# Patient Record
Sex: Female | Born: 1975 | Race: White | Hispanic: Yes | Smoking: Current every day smoker
Health system: Southern US, Community
[De-identification: ages and names within clinical notes are randomized; demographics above are authoritative.]

## PROBLEM LIST (undated history)

## (undated) DIAGNOSIS — F419 Anxiety disorder, unspecified: Secondary | ICD-10-CM

## (undated) DIAGNOSIS — E119 Type 2 diabetes mellitus without complications: Secondary | ICD-10-CM

## (undated) DIAGNOSIS — H5213 Myopia, bilateral: Secondary | ICD-10-CM

## (undated) DIAGNOSIS — G629 Polyneuropathy, unspecified: Secondary | ICD-10-CM

## (undated) DIAGNOSIS — M5430 Sciatica, unspecified side: Secondary | ICD-10-CM

## (undated) DIAGNOSIS — C801 Malignant (primary) neoplasm, unspecified: Secondary | ICD-10-CM

## (undated) DIAGNOSIS — L680 Hirsutism: Secondary | ICD-10-CM

## (undated) DIAGNOSIS — K219 Gastro-esophageal reflux disease without esophagitis: Secondary | ICD-10-CM

## (undated) DIAGNOSIS — I10 Essential (primary) hypertension: Secondary | ICD-10-CM

## (undated) DIAGNOSIS — G4733 Obstructive sleep apnea (adult) (pediatric): Secondary | ICD-10-CM

## (undated) DIAGNOSIS — M797 Fibromyalgia: Secondary | ICD-10-CM

## (undated) DIAGNOSIS — K589 Irritable bowel syndrome without diarrhea: Secondary | ICD-10-CM

## (undated) DIAGNOSIS — Z794 Long term (current) use of insulin: Secondary | ICD-10-CM

## (undated) DIAGNOSIS — I639 Cerebral infarction, unspecified: Secondary | ICD-10-CM

## (undated) DIAGNOSIS — G43909 Migraine, unspecified, not intractable, without status migrainosus: Secondary | ICD-10-CM

## (undated) DIAGNOSIS — B029 Zoster without complications: Secondary | ICD-10-CM

## (undated) DIAGNOSIS — E785 Hyperlipidemia, unspecified: Secondary | ICD-10-CM

## (undated) DIAGNOSIS — F418 Other specified anxiety disorders: Secondary | ICD-10-CM

## (undated) HISTORY — DX: Fibromyalgia: M79.7

## (undated) HISTORY — DX: Hirsutism: L68.0

## (undated) HISTORY — DX: Long term (current) use of insulin: Z79.4

## (undated) HISTORY — PX: FOOT SURGERY: SHX648

## (undated) HISTORY — DX: Migraine, unspecified, not intractable, without status migrainosus: G43.909

## (undated) HISTORY — DX: Irritable bowel syndrome, unspecified: K58.9

## (undated) HISTORY — DX: Obstructive sleep apnea (adult) (pediatric): G47.33

## (undated) HISTORY — DX: Hyperlipidemia, unspecified: E78.5

## (undated) HISTORY — PX: FACIAL COSMETIC SURGERY: SHX629

## (undated) HISTORY — DX: Cerebral infarction, unspecified: I63.9

## (undated) HISTORY — DX: Polyneuropathy, unspecified: G62.9

## (undated) HISTORY — DX: Other specified anxiety disorders: F41.8

## (undated) HISTORY — DX: Myopia, bilateral: H52.13

## (undated) HISTORY — DX: Gastro-esophageal reflux disease without esophagitis: K21.9

---

## 1999-10-18 HISTORY — PX: CHOLECYSTECTOMY: SHX55

## 2010-05-28 ENCOUNTER — Emergency Department (HOSPITAL_BASED_OUTPATIENT_CLINIC_OR_DEPARTMENT_OTHER): Admission: EM | Admit: 2010-05-28 | Discharge: 2010-05-28 | Payer: Self-pay | Admitting: Emergency Medicine

## 2011-11-05 ENCOUNTER — Emergency Department (HOSPITAL_COMMUNITY)
Admission: EM | Admit: 2011-11-05 | Discharge: 2011-11-06 | Disposition: A | Discharge status: Home / Self Care | Payer: Self-pay | Attending: Emergency Medicine | Admitting: Emergency Medicine

## 2011-11-05 ENCOUNTER — Encounter: Payer: Self-pay | Admitting: *Deleted

## 2011-11-05 ENCOUNTER — Emergency Department (HOSPITAL_COMMUNITY): Payer: Self-pay

## 2011-11-05 DIAGNOSIS — M545 Low back pain, unspecified: Secondary | ICD-10-CM | POA: Insufficient documentation

## 2011-11-05 DIAGNOSIS — R51 Headache: Secondary | ICD-10-CM | POA: Insufficient documentation

## 2011-11-05 DIAGNOSIS — T1490XA Injury, unspecified, initial encounter: Secondary | ICD-10-CM | POA: Insufficient documentation

## 2011-11-05 DIAGNOSIS — M542 Cervicalgia: Secondary | ICD-10-CM | POA: Insufficient documentation

## 2011-11-05 HISTORY — DX: Essential (primary) hypertension: I10
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
## 2011-11-06 NOTE — ED Notes (Signed)
Patient is resting comfortably. 

## 2011-11-06 NOTE — ED Notes (Signed)
MD at bedside. 

## 2011-11-06 NOTE — ED Notes (Signed)
Family at bedside. 

## 2011-11-06 NOTE — Discharge Instructions (Signed)
It is very important that you have someone stay with you and monitor you.  Return to the Emergency Department immediately if you develop changes in your vision, vomiting, or confusion.  Take ibuprofen as needed for pain.  RESOURCE GUIDE  Dental Problems  Patients with Medicaid: [facility] [phone] [address]                                           [phone] [address] Phone:  [phone]                                                  Phone:  [phone]  If unable to pay or uninsured, contact:  [facility] or [facility] to become qualified for the adult dental clinic.  Chronic Pain Problems Contact [facility]  [phone] Patients need to be referred by their primary care doctor.  Insufficient Money for Medicine Contact [employer]:  call "211" or [facility] [phone].  No Primary Care Doctor Call [facility]  [phone] Other agencies that provide inexpensive medical care    [facility]  [phone]    [facility]  [phone]    [facility]  [phone]    [facility]  [phone]    [facility]  [phone]    [facility]  [phone]  Psychological Services [facility]  [phone] [facility]  [phone] [facility]   [phone] (emergency services [phone])  Substance Abuse Resources [facility]  [phone] [facility] [phone] [facility] [phone] Floydene Flock [phone] Residential & Outpatient Substance Abuse Program  ([phone]  Abuse/Neglect [facility] Child Abuse Hotline (512)063-5962 [facility] Child Abuse Hotline 548-373-7700 (After Hours)  Emergency Shelter [facility] [phone]  Maternity Homes [facility] [place] [facility] [phone] [facility] [phone]  MRSA Hotline  #:   954-736-5758    [facility] Resources  [facility] [place]     [employer]                          [facility] Dept. [address] [place]                       [address]      [address]  [place]                                                Cristobal Goldmann Phone:  [phone]  Phone:  [phone]                 Phone:  [phone]  [facility] Phone:  [phone]  [facility] Child Abuse Hotline [phone] [phone] (After Hours)    Assault, General Assault includes any behavior, whether intentional or reckless, which results in bodily injury to another person and/or damage to property. Included in this would be any behavior, intentional or reckless, that by its nature would be understood (interpreted) by a reasonable person as intent to harm another person or to damage his/her property. Threats may be oral or written. They may be communicated through regular mail, computer, fax, or phone. These threats may be direct or implied. FORMS OF ASSAULT INCLUDE:  Physically assaulting a person. This includes physical threats to inflict physical harm as well as:   Slapping.   Hitting.   Poking.   Kicking.   Punching.   Pushing.   Arson.   Sabotage.   Equipment vandalism.   Damaging or destroying property.   Throwing or hitting objects.   Displaying a weapon or an object that appears to be a weapon in a threatening manner.   Carrying a firearm of any kind.   Using a weapon to harm someone.   Using greater physical size/strength to intimidate another.   Making intimidating or threatening gestures.   Bullying.   Hazing.   Intimidating, threatening, hostile, or abusive language directed toward another person.   It communicates the intention to engage in violence against that person. And it leads a reasonable person to  expect that violent behavior may occur.   Stalking another person.  IF IT HAPPENS AGAIN:  Immediately call for emergency help (911 in [place]   If someone poses clear and immediate danger to you, seek legal authorities to have a protective or restraining order put in place.   Less threatening assaults can at least be reported to authorities.  STEPS TO TAKE IF A SEXUAL ASSAULT HAS HAPPENED  Go to an area of safety. This may include a shelter or staying with a friend. Stay away from the area where you have been attacked. A large percentage of sexual assaults are caused by a friend, relative or associate.   If medications were given by your caregiver, take them as directed for the full length of time prescribed.   Only take over-the-counter or prescription medicines for pain, discomfort, or fever as directed by your caregiver.   If you have come in contact with a sexual disease, find out if you are to be tested again. If your caregiver is concerned about the HIV/AIDS virus, he/she may require you to have continued testing for several months.   For the protection of your privacy, test results can not be given over the phone. Make sure you receive the results of your test. If your test results are not back during your visit, make an appointment with your caregiver to find out the results. Do not assume everything is normal if you have not heard from your caregiver or the medical facility. It is important for you to follow up on all of your test results.   File appropriate papers with authorities. This is important in all assaults, even if it has occurred in a family or by a friend.  SEEK MEDICAL CARE IF:  You have new problems because of your injuries.   You have problems that may be because of the medicine you are taking, such as:  Rash.   Itching.   Swelling.   Trouble breathing.   You develop belly (abdominal) pain, feel sick to your stomach (nausea) or are vomiting.   You begin  to run a temperature.   You need supportive care or referral to a rape crisis center. These are centers with trained personnel who can help you get through this ordeal.  SEEK IMMEDIATE MEDICAL CARE IF:  You are afraid of being threatened, beaten, or abused. In [place] call 911.   You receive new injuries related to abuse.   You develop severe pain in any area injured in the assault or have any change in your condition that concerns you.   You faint or lose consciousness.   You develop chest pain or shortness of breath.  Document Released: 12/03/2005 Document Revised: 08/15/2011 Document Reviewed: 07/21/2008 [facility] Patient Information 2012 [place], [place].

## 2018-02-22 ENCOUNTER — Encounter (HOSPITAL_COMMUNITY): Payer: Self-pay | Admitting: Emergency Medicine

## 2018-02-22 ENCOUNTER — Emergency Department (HOSPITAL_COMMUNITY)
Admission: EM | Admit: 2018-02-22 | Discharge: 2018-02-22 | Disposition: A | Discharge status: Home / Self Care | Payer: Medicare Other | Attending: Emergency Medicine | Admitting: Emergency Medicine

## 2018-02-22 DIAGNOSIS — R0789 Other chest pain: Secondary | ICD-10-CM | POA: Diagnosis not present

## 2018-02-22 DIAGNOSIS — R51 Headache: Secondary | ICD-10-CM | POA: Insufficient documentation

## 2018-02-22 DIAGNOSIS — R110 Nausea: Secondary | ICD-10-CM | POA: Insufficient documentation

## 2018-02-22 DIAGNOSIS — R0602 Shortness of breath: Secondary | ICD-10-CM | POA: Insufficient documentation

## 2018-02-22 DIAGNOSIS — F17210 Nicotine dependence, cigarettes, uncomplicated: Secondary | ICD-10-CM | POA: Diagnosis not present

## 2018-02-22 DIAGNOSIS — R1032 Left lower quadrant pain: Secondary | ICD-10-CM | POA: Diagnosis not present

## 2018-02-22 DIAGNOSIS — N390 Urinary tract infection, site not specified: Secondary | ICD-10-CM | POA: Diagnosis not present

## 2018-02-22 DIAGNOSIS — R350 Frequency of micturition: Secondary | ICD-10-CM | POA: Diagnosis not present

## 2018-02-22 DIAGNOSIS — R05 Cough: Secondary | ICD-10-CM | POA: Diagnosis not present

## 2018-02-22 DIAGNOSIS — R42 Dizziness and giddiness: Secondary | ICD-10-CM | POA: Diagnosis not present

## 2018-02-22 DIAGNOSIS — I10 Essential (primary) hypertension: Secondary | ICD-10-CM | POA: Diagnosis not present

## 2018-02-22 DIAGNOSIS — E119 Type 2 diabetes mellitus without complications: Secondary | ICD-10-CM | POA: Diagnosis not present

## 2018-02-22 DIAGNOSIS — R519 Headache, unspecified: Secondary | ICD-10-CM

## 2018-02-22 DIAGNOSIS — H53149 Visual discomfort, unspecified: Secondary | ICD-10-CM | POA: Insufficient documentation

## 2018-02-22 HISTORY — DX: Anxiety disorder, unspecified: F41.9

## 2018-02-22 HISTORY — DX: Type 2 diabetes mellitus without complications: E11.9

## 2018-02-22 LAB — I-STAT BETA HCG BLOOD, ED (MC, WL, AP ONLY): I-stat hCG, quantitative: <5 m[IU]/mL (ref <5)

## 2018-02-22 LAB — CBC
HCT: 48.1 % — ABNORMAL HIGH (ref 36.0–46.0)
Hemoglobin: 16.7 g/dL — ABNORMAL HIGH (ref 12.0–15.0)
MCH: 31.1 pg (ref 26.0–34.0)
MCHC: 34.7 g/dL (ref 30.0–36.0)
MCV: 89.6 fL (ref 78.0–100.0)
Platelets: 456 10*3/uL — ABNORMAL HIGH (ref 150–400)
RBC: 5.37 MIL/uL — ABNORMAL HIGH (ref 3.87–5.11)
RDW: 12.6 % (ref 11.5–15.5)
WBC: 12.8 10*3/uL — ABNORMAL HIGH (ref 4.0–10.5)

## 2018-02-22 LAB — BASIC METABOLIC PANEL
Anion gap: 9 (ref 5–15)
BUN: 11 mg/dL (ref 6–20)
CO2: 25 mmol/L (ref 22–32)
Calcium: 9.3 mg/dL (ref 8.9–10.3)
Chloride: 100 mmol/L — ABNORMAL LOW (ref 101–111)
Creatinine, Ser: 0.69 mg/dL (ref 0.44–1.00)
GFR calc Af Amer: >60 mL/min (ref >60)
GFR calc non Af Amer: >60 mL/min (ref >60)
Glucose, Bld: 359 mg/dL — ABNORMAL HIGH (ref 65–99)
Potassium: 4.6 mmol/L (ref 3.5–5.1)
Sodium: 134 mmol/L — ABNORMAL LOW (ref 135–145)

## 2018-02-22 LAB — I-STAT TROPONIN, ED: Troponin i, poc: 0 ng/mL (ref 0.00–0.08)

## 2018-02-22 LAB — CBG MONITORING, ED: Glucose-Capillary: 344 mg/dL — ABNORMAL HIGH (ref 65–99)

## 2018-02-22 MED ORDER — PROCHLORPERAZINE EDISYLATE 5 MG/ML IJ SOLN
10.0000 mg | Freq: Once | INTRAMUSCULAR | Status: AC
  Administered 2018-02-22: 10 mg via INTRAVENOUS
  Filled 2018-02-22: qty 2

## 2018-02-22 MED ORDER — CEPHALEXIN 500 MG PO CAPS: 500.0000 mg | ORAL_CAPSULE | Freq: Three times a day (TID) | ORAL | 0 refills | Status: DC

## 2018-02-22 MED ORDER — SODIUM CHLORIDE 0.9 % IV BOLUS (SEPSIS)
1000.0000 mL | Freq: Once | INTRAVENOUS | Status: AC
  Administered 2018-02-22: 1000 mL via INTRAVENOUS

## 2018-02-22 MED ORDER — DIPHENHYDRAMINE HCL 50 MG/ML IJ SOLN
25.0000 mg | Freq: Once | INTRAMUSCULAR | Status: AC
Start: 2018-02-22 — End: 2018-02-22
  Administered 2018-02-22: 25 mg via INTRAVENOUS
  Filled 2018-02-22: qty 1

## 2018-02-22 MED ORDER — ONDANSETRON 8 MG PO TBDP: 8.0000 mg | ORAL_TABLET | Freq: Three times a day (TID) | ORAL | 0 refills | Status: DC | PRN

## 2018-02-22 MED ORDER — KETOROLAC TROMETHAMINE 30 MG/ML IJ SOLN
30.0000 mg | Freq: Once | INTRAMUSCULAR | Status: AC
  Administered 2018-02-22: 30 mg via INTRAVENOUS
  Filled 2018-02-22: qty 1

## 2018-02-22 NOTE — ED Provider Notes (Signed)
[facility] DEPT Provider Note   CSN: [id number] Arrival date & time: 02/22/18  1803     History   Chief Complaint Chief Complaint  Patient presents with  . Dizziness  . Nausea    HPI Phyllis Pittman is a 42 y.o. female.  HPI Phyllis Pittman is a 42 y.o. female with hx of anxiety, DM, HTN, migraines, presents to ED with multiple complaints. Pt reports headache that is similar to her migraine for the last two days, reports subjective fever, chills, photophobia, chest pain, abdominal pain, left flank pain, nausea, dry heaves, dysuria, urinary frequency. States has tried her regular migraine medications which has not helped. States has not eaten much since last night due to no appetite.   Past Medical History:  Diagnosis Date  . Anxiety   . Diabetes mellitus without complication ([place])   . Hypertension     There are no active problems to display for this patient.   History reviewed. No pertinent surgical history.  OB History    No data available       Home Medications    Prior to Admission medications   Not on File    Family History No family history on file.  Social History Social History   Tobacco Use  . Smoking status: Current Every Day Smoker  . Smokeless tobacco: Never Used  Substance Use Topics  . Alcohol use: Yes  . Drug use: No     Allergies   Codeine and Famotidine   Review of Systems Review of Systems  Constitutional: Positive for chills and fever.  HENT: Negative for congestion.   Eyes: Positive for photophobia.  Respiratory: Positive for cough and shortness of breath. Negative for chest tightness.   Cardiovascular: Positive for chest pain. Negative for palpitations and leg swelling.  Gastrointestinal: Positive for abdominal pain, nausea and vomiting. Negative for diarrhea.  Genitourinary: Positive for dysuria and flank pain. Negative for pelvic pain, vaginal bleeding, vaginal discharge and vaginal pain.    Musculoskeletal: Negative for arthralgias, myalgias, neck pain and neck stiffness.  Skin: Negative for rash.  Neurological: Positive for headaches. Negative for dizziness and weakness.  All other systems reviewed and are negative.    Physical Exam Updated Vital Signs BP (!) 120/94 (BP Location: Left Arm)   Pulse 100   Temp 98.3 F (36.8 C) (Oral)   Resp 18   SpO2 96%   Physical Exam  Constitutional: She is oriented to person, place, and time. She appears well-developed and well-nourished. No distress.  HENT:  Head: Normocephalic.  Eyes: Conjunctivae and EOM are normal. Pupils are equal, round, and reactive to light.  Neck: Normal range of motion. Neck supple.  Cardiovascular: Normal rate, regular rhythm and normal heart sounds.  Pulmonary/Chest: Effort normal and breath sounds normal. No respiratory distress. She has no wheezes. She has no rales.  Abdominal: Soft. Bowel sounds are normal. She exhibits no distension. There is tenderness. There is no rebound.  Suprapubic  Tenderness. Left CVA tenderness  Musculoskeletal: She exhibits no edema.  Neurological: She is alert and oriented to person, place, and time.  5/5 and equal upper and lower extremity strength bilaterally. Equal grip strength bilaterally. Normal finger to nose and heel to shin. No pronator drift.   Skin: Skin is warm and dry.  Psychiatric: She has a normal mood and affect. Her behavior is normal.  Nursing note and vitals reviewed.    ED Treatments / Results  Labs (all labs ordered are listed, but  only abnormal results are displayed) Labs Reviewed  BASIC METABOLIC PANEL - Abnormal; Notable for the following components:      Result Value   Sodium 134 (*)    Chloride 100 (*)    Glucose, Bld 359 (*)    All other components within normal limits  CBC - Abnormal; Notable for the following components:   WBC 12.8 (*)    RBC 5.37 (*)    Hemoglobin 16.7 (*)    HCT 48.1 (*)    Platelets 456 (*)    All other  components within normal limits  CBG MONITORING, ED - Abnormal; Notable for the following components:   Glucose-Capillary 344 (*)    All other components within normal limits  URINALYSIS, ROUTINE W REFLEX MICROSCOPIC  I-STAT BETA HCG BLOOD, ED ([facility], [facility], [facility] ONLY)  I-STAT TROPONIN, ED    EKG  EKG Interpretation  Date/Time:  Saturday February 22 2018 18:26:31 EST Ventricular Rate:  99 PR Interval:    QRS Duration: 79 QT Interval:  335 QTC Calculation: 430 R Axis:   43 Text Interpretation:  Sinus rhythm Low voltage, precordial leads Borderline T wave abnormalities Baseline wander in lead(s) I aVL No old tracing to compare Confirmed by Daleen Bo [phone]) on 02/22/2018 6:38:11 PM       Radiology No results found.  Procedures Procedures (including critical care time)  Medications Ordered in ED Medications  ketorolac (TORADOL) 30 MG/ML injection 30 mg (not administered)  prochlorperazine (COMPAZINE) injection 10 mg (not administered)  diphenhydrAMINE (BENADRYL) injection 25 mg (not administered)  sodium chloride 0.9 % bolus 1,000 mL (not administered)     Initial Impression / Assessment and Plan / ED Course  I have reviewed the triage vital signs and the nursing notes.  Pertinent labs & imaging results that were available during my care of the patient were reviewed by me and considered in my medical decision making (see chart for details).     Patient in emergency department multiple complaints.  She is hyperglycemic, CBG is 344.  She is also complaining of chest pain, abdominal pain, nausea, migraine headache, dizziness.  I will check orthostatics, will check labs, will administer IV fluids.   Patient is not significantly orthostatic.  Her white blood cell count hemoglobin and platelets are all elevated.  Question dehydration.  She received fluids in department.  She received migraine cocktail, Toradol, Compazine, Benadryl, headache improved.  Urine analysis is contaminated,  but is positive for many bacteria, 6-30 WBCs, WBC clumps, mucus, budding yeast, sperm, crystals all present.  Since she is having some urinary symptoms and suprapubic pain and flank pain, will treat for possible UTI.  Will start on Keflex.  Urine cultures have been sent.  She denies any possible concern for STD and states she is currently having a yeast infection and on Diflucan.  Instructed to continue to take Diflucan.  Her vital signs are all within normal at this time.  I do not think she needs any further imaging or treatment in emergency department.  We discussed better glucose control.  Discussed having her follow-up with her family doctor in 2-3 days for recheck.  If her symptoms worsen she needs to come back to emergency department.  Patient agrees with the plan.  Vitals:   02/22/18 1821 02/22/18 2000 02/22/18 2007 02/22/18 2108  BP: (!) 120/94 (!) 124/97 (!) 131/91   Pulse: 100 (!) 103 92   Resp: 18  16   Temp: 98.3 F (36.8 C)  TempSrc: Oral     SpO2: 96% 99% 99%   Weight:    78.9 kg (174 lb)  Height:    5' (1.524 m)     Final Clinical Impressions(s) / ED Diagnoses   Final diagnoses:  Nonintractable headache, unspecified chronicity pattern, unspecified headache type  Urinary tract infection without hematuria, site unspecified    ED Discharge Orders        Ordered    cephALEXin (KEFLEX) 500 MG capsule  3 times daily     02/22/18 2221    ondansetron (ZOFRAN ODT) 8 MG disintegrating tablet  Every 8 hours PRN     02/22/18 2221       Jeannett Senior, PA-C 02/22/18 2224    Daleen Bo, MD 02/22/18 2308

## 2018-02-22 NOTE — Discharge Instructions (Signed)
Rest, drink plenty of fluids.  Take Zofran as prescribed as needed for nausea.  Take Keflex as prescribed until all gone for urinary tract infection.  Make sure you take a Diflucan, there was some yeast in your urine today.  Follow-up with your family doctor in 2-3 days for recheck.  Return if worsening symptoms.

## 2018-02-22 NOTE — ED Triage Notes (Signed)
Patient c/o dizziness started last night esp with movements with nausea didn't vomit only dry heave.

## 2018-02-23 LAB — URINALYSIS, ROUTINE W REFLEX MICROSCOPIC
Bilirubin Urine: NEGATIVE
Glucose, UA: 500 mg/dL — AB
Hgb urine dipstick: NEGATIVE
Ketones, ur: 5 mg/dL — AB
Nitrite: NEGATIVE
Protein, ur: NEGATIVE mg/dL
Specific Gravity, Urine: 1.015 (ref 1.005–1.030)
Sperm, UA: NONE SEEN
pH: 7 (ref 5.0–8.0)

## 2018-03-01 LAB — URINE CULTURE: Culture: >=100000 — AB

## 2018-03-02 ENCOUNTER — Telehealth: Payer: Self-pay | Admitting: *Deleted

## 2018-03-02 NOTE — Telephone Encounter (Signed)
Post ED Visit - Positive Culture Follow-up  Culture report reviewed by antimicrobial stewardship pharmacist:  []  Elenor Quinones, Pharm.D. []  Heide Guile, Pharm.D., BCPS AQ-ID []  Parks Neptune, Pharm.D., BCPS []  Alycia Rossetti, Pharm.D., BCPS []  [place], Pharm.D., BCPS, AAHIVP []  Legrand Como, Pharm.D., BCPS, AAHIVP []  Salome Arnt, PharmD, BCPS []  Jalene Mullet, PharmD []  Vincenza Hews, PharmD, BCPS Georga Bora, PharmD  Positive urine culture Treated with Cephalexin, organism sensitive to the same and no further patient follow-up is required at this time.  Harlon Flor [facility] 03/02/2018, 11:19 AM

## 2019-05-18 ENCOUNTER — Other Ambulatory Visit (HOSPITAL_BASED_OUTPATIENT_CLINIC_OR_DEPARTMENT_OTHER): Payer: Self-pay | Admitting: Internal Medicine

## 2019-05-18 DIAGNOSIS — Z1231 Encounter for screening mammogram for malignant neoplasm of breast: Secondary | ICD-10-CM

## 2019-07-22 ENCOUNTER — Emergency Department (HOSPITAL_BASED_OUTPATIENT_CLINIC_OR_DEPARTMENT_OTHER)
Admission: EM | Admit: 2019-07-22 | Discharge: 2019-07-22 | Disposition: A | Discharge status: Home / Self Care | Payer: Medicare Other | Attending: Emergency Medicine | Admitting: Emergency Medicine

## 2019-07-22 ENCOUNTER — Other Ambulatory Visit: Payer: Self-pay

## 2019-07-22 ENCOUNTER — Encounter (HOSPITAL_BASED_OUTPATIENT_CLINIC_OR_DEPARTMENT_OTHER): Payer: Self-pay

## 2019-07-22 ENCOUNTER — Emergency Department (HOSPITAL_BASED_OUTPATIENT_CLINIC_OR_DEPARTMENT_OTHER): Payer: Medicare Other

## 2019-07-22 DIAGNOSIS — R1031 Right lower quadrant pain: Secondary | ICD-10-CM | POA: Insufficient documentation

## 2019-07-22 DIAGNOSIS — R1032 Left lower quadrant pain: Secondary | ICD-10-CM

## 2019-07-22 DIAGNOSIS — Z3202 Encounter for pregnancy test, result negative: Secondary | ICD-10-CM | POA: Insufficient documentation

## 2019-07-22 DIAGNOSIS — Z859 Personal history of malignant neoplasm, unspecified: Secondary | ICD-10-CM | POA: Insufficient documentation

## 2019-07-22 DIAGNOSIS — F17210 Nicotine dependence, cigarettes, uncomplicated: Secondary | ICD-10-CM | POA: Insufficient documentation

## 2019-07-22 DIAGNOSIS — R109 Unspecified abdominal pain: Secondary | ICD-10-CM

## 2019-07-22 DIAGNOSIS — I10 Essential (primary) hypertension: Secondary | ICD-10-CM | POA: Diagnosis not present

## 2019-07-22 DIAGNOSIS — Z79899 Other long term (current) drug therapy: Secondary | ICD-10-CM | POA: Diagnosis not present

## 2019-07-22 DIAGNOSIS — E119 Type 2 diabetes mellitus without complications: Secondary | ICD-10-CM | POA: Insufficient documentation

## 2019-07-22 DIAGNOSIS — Z794 Long term (current) use of insulin: Secondary | ICD-10-CM | POA: Insufficient documentation

## 2019-07-22 HISTORY — DX: Malignant (primary) neoplasm, unspecified: C80.1

## 2019-07-22 HISTORY — DX: Sciatica, unspecified side: M54.30

## 2019-07-22 HISTORY — DX: Zoster without complications: B02.9

## 2019-07-22 LAB — CBC WITH DIFFERENTIAL/PLATELET
Abs Immature Granulocytes: 0.03 10*3/uL (ref 0.00–0.07)
Basophils Absolute: 0 10*3/uL (ref 0.0–0.1)
Basophils Relative: 0 %
Eosinophils Absolute: 0.1 10*3/uL (ref 0.0–0.5)
Eosinophils Relative: 1 %
HCT: 43.3 % (ref 36.0–46.0)
Hemoglobin: 14.4 g/dL (ref 12.0–15.0)
Immature Granulocytes: 0 %
Lymphocytes Relative: 34 %
Lymphs Abs: 3.5 10*3/uL (ref 0.7–4.0)
MCH: 29.9 pg (ref 26.0–34.0)
MCHC: 33.3 g/dL (ref 30.0–36.0)
MCV: 90 fL (ref 80.0–100.0)
Monocytes Absolute: 0.6 10*3/uL (ref 0.1–1.0)
Monocytes Relative: 6 %
Neutro Abs: 6 10*3/uL (ref 1.7–7.7)
Neutrophils Relative %: 59 %
Platelets: 391 10*3/uL (ref 150–400)
RBC: 4.81 MIL/uL (ref 3.87–5.11)
RDW: 11.7 % (ref 11.5–15.5)
WBC: 10.3 10*3/uL (ref 4.0–10.5)
nRBC: 0 % (ref 0.0–0.2)

## 2019-07-22 LAB — LIPASE, BLOOD: Lipase: 42 U/L (ref 11–51)

## 2019-07-22 LAB — COMPREHENSIVE METABOLIC PANEL
ALT: 19 U/L (ref 0–44)
AST: 18 U/L (ref 15–41)
Albumin: 3.5 g/dL (ref 3.5–5.0)
Alkaline Phosphatase: 63 U/L (ref 38–126)
Anion gap: 9 (ref 5–15)
BUN: 6 mg/dL (ref 6–20)
CO2: 26 mmol/L (ref 22–32)
Calcium: 8.9 mg/dL (ref 8.9–10.3)
Chloride: 100 mmol/L (ref 98–111)
Creatinine, Ser: 0.66 mg/dL (ref 0.44–1.00)
GFR calc Af Amer: >60 mL/min (ref >60)
GFR calc non Af Amer: >60 mL/min (ref >60)
Glucose, Bld: 372 mg/dL — ABNORMAL HIGH (ref 70–99)
Potassium: 3.8 mmol/L (ref 3.5–5.1)
Sodium: 135 mmol/L (ref 135–145)
Total Bilirubin: 0.6 mg/dL (ref 0.3–1.2)
Total Protein: 6.6 g/dL (ref 6.5–8.1)

## 2019-07-22 LAB — URINALYSIS, ROUTINE W REFLEX MICROSCOPIC
Bilirubin Urine: NEGATIVE
Glucose, UA: >=500 mg/dL — AB
Hgb urine dipstick: NEGATIVE
Ketones, ur: NEGATIVE mg/dL
Leukocytes,Ua: NEGATIVE
Nitrite: NEGATIVE
Protein, ur: NEGATIVE mg/dL
Specific Gravity, Urine: 1.02 (ref 1.005–1.030)
pH: 6 (ref 5.0–8.0)

## 2019-07-22 LAB — URINALYSIS, MICROSCOPIC (REFLEX)

## 2019-07-22 LAB — PREGNANCY, URINE: Preg Test, Ur: NEGATIVE

## 2019-07-22 MED ORDER — METHOCARBAMOL 500 MG PO TABS: 500.0000 mg | ORAL_TABLET | Freq: Two times a day (BID) | ORAL | 0 refills | Status: DC

## 2019-07-22 MED ORDER — ACETAMINOPHEN 325 MG PO TABS
ORAL_TABLET | ORAL | Status: AC
  Administered 2019-07-22: 650 mg via ORAL
  Filled 2019-07-22: qty 2

## 2019-07-22 MED ORDER — ACETAMINOPHEN 325 MG PO TABS
650.0000 mg | ORAL_TABLET | Freq: Once | ORAL | Status: AC
  Administered 2019-07-22: 19:00:00 650 mg via ORAL

## 2019-07-22 MED ORDER — KETOROLAC TROMETHAMINE 30 MG/ML IJ SOLN
30.0000 mg | Freq: Once | INTRAMUSCULAR | Status: DC
  Filled 2019-07-22: qty 1

## 2019-07-22 NOTE — ED Triage Notes (Signed)
Pt c/o right flank pain x 3 days-sent by PCP to ED-NAD-steady gait

## 2019-07-22 NOTE — ED Notes (Signed)
Patient transported to CT 

## 2019-07-22 NOTE — ED Notes (Signed)
Pt requesting paid meds. Per PA, she is offered tylenol as she refused Toradol earlier. Pt refused tylenol

## 2019-07-22 NOTE — ED Provider Notes (Signed)
[facility] EMERGENCY DEPARTMENT Provider Note   CSN: [id number] Arrival date & time: 07/22/19  1434    History   Chief Complaint Chief Complaint  Patient presents with  . Flank Pain    HPI Phyllis Pittman is a 43 y.o. female smokes 3 anxiety, cancer, diabetes, hypertension, sciatica, shingles who presents for evaluation of right lower quadrant abdominal pain that radiates around the right flank that is been ongoing for the last 3 days.  She states that she feels like the pain starts in the right lower quadrant and radiates back.  She states it is worse with movement and bending.  She has not noted any associated dysuria or hematuria.  She states that she is taken over-the-counter medications with minimal improvement in pain.  She states that sometimes she feels like she will get a sharp pain down her legs.  She has not had any trauma, injury, fall.  She denies any numbness/weakness of her extremities.  She was seen by her PCP earlier this morning who advised her to come to the emergency department for further evaluation.  Patient states she has not noted any fevers, vomiting, nausea, chest pain, difficulty breathing.     The history is provided by the patient.    Past Medical History:  Diagnosis Date  . Anxiety   . Cancer ([place])   . Diabetes mellitus without complication ([place])   . Hypertension   . Sciatica   . Shingles     There are no active problems to display for this patient.   History reviewed. No pertinent surgical history.   OB History   No obstetric history on file.      Home Medications    Prior to Admission medications   Medication Sig Start Date End Date Taking? Authorizing Provider  insulin aspart (NOVOLOG) 100 UNIT/ML injection Inject into the skin 3 (three) times daily before meals.   Yes [provider]  insulin detemir (LEVEMIR) 100 UNIT/ML injection Inject into the skin daily.   Yes [provider]  naproxen (NAPROSYN) 500  MG tablet Take 500 mg by mouth 2 (two) times daily with a meal.   Yes [provider]  tiZANidine (ZANAFLEX) 2 MG tablet Take by mouth every 6 (six) hours as needed for muscle spasms.   Yes [provider]  cephALEXin (KEFLEX) 500 MG capsule Take 1 capsule (500 mg total) by mouth 3 (three) times daily. 02/22/18   Kirichenko, Tatyana, PA-C  methocarbamol (ROBAXIN) 500 MG tablet Take 1 tablet (500 mg total) by mouth 2 (two) times daily. 07/22/19   Volanda Napoleon, PA-C  ondansetron (ZOFRAN ODT) 8 MG disintegrating tablet Take 1 tablet (8 mg total) by mouth every 8 (eight) hours as needed for nausea or vomiting. 02/22/18   Jeannett Senior, PA-C    Family History No family history on file.  Social History Social History   Tobacco Use  . Smoking status: Current Every Day Smoker    Types: Cigarettes  . Smokeless tobacco: Never Used  Substance Use Topics  . Alcohol use: Yes    Comment: occ  . Drug use: No     Allergies   Famotidine and Levaquin [levofloxacin]   Review of Systems Review of Systems  Constitutional: Negative for fever.  Respiratory: Negative for cough and shortness of breath.   Cardiovascular: Negative for chest pain.  Gastrointestinal: Positive for abdominal pain. Negative for nausea and vomiting.  Genitourinary: Positive for flank pain. Negative for dysuria and hematuria.  Musculoskeletal: Positive for back pain.  Neurological: Negative for headaches.  All other systems reviewed and are negative.    Physical Exam Updated Vital Signs BP 108/75   Pulse 82   Temp 99.3 F (37.4 C) (Oral)   Resp 16   Ht 5' (1.524 m)   Wt 71.7 kg   LMP 07/10/2019   SpO2 97%   BMI 30.86 kg/m   Physical Exam Vitals signs and nursing note reviewed.  Constitutional:      Appearance: Normal appearance. She is well-developed.  HENT:     Head: Normocephalic and atraumatic.  Eyes:     General: Lids are normal.     Conjunctiva/sclera: Conjunctivae normal.      Pupils: Pupils are equal, round, and reactive to light.  Neck:     Musculoskeletal: Full passive range of motion without pain.  Cardiovascular:     Rate and Rhythm: Normal rate and regular rhythm.     Pulses: Normal pulses.          Radial pulses are 2+ on the right side and 2+ on the left side.       Dorsalis pedis pulses are 2+ on the right side and 2+ on the left side.     Heart sounds: Normal heart sounds. No murmur. No friction rub. No gallop.   Pulmonary:     Effort: Pulmonary effort is normal.     Breath sounds: Normal breath sounds.  Abdominal:     Palpations: Abdomen is soft. Abdomen is not rigid.     Tenderness: There is generalized abdominal tenderness. There is right CVA tenderness. There is no guarding.     Comments: Abdomen soft, nondistended.  Diffuse abdominal tenderness noted slightly more worse in the right lower quadrant.  No focal point.  Right-sided CVA tenderness noted.  No rigidity, guarding.  Musculoskeletal: Normal range of motion.  Skin:    General: Skin is warm and dry.     Capillary Refill: Capillary refill takes less than 2 seconds.  Neurological:     Mental Status: She is alert and oriented to person, place, and time.     Comments: Follows commands, Moves all extremities  5/5 strength to BUE and BLE  Sensation intact throughout all major nerve distributions  Psychiatric:        Speech: Speech normal.      ED Treatments / Results  Labs (all labs ordered are listed, but only abnormal results are displayed) Labs Reviewed  URINALYSIS, ROUTINE W REFLEX MICROSCOPIC - Abnormal; Notable for the following components:      Result Value   APPearance CLOUDY (*)    Glucose, UA >=500 (*)    All other components within normal limits  URINALYSIS, MICROSCOPIC (REFLEX) - Abnormal; Notable for the following components:   Bacteria, UA MANY (*)    All other components within normal limits  COMPREHENSIVE METABOLIC PANEL - Abnormal; Notable for the following  components:   Glucose, Bld 372 (*)    All other components within normal limits  PREGNANCY, URINE  CBC WITH DIFFERENTIAL/PLATELET  LIPASE, BLOOD    EKG None  Radiology Ct Renal Stone Study  Result Date: 07/22/2019 CLINICAL DATA:  RIGHT flank pain for 3 days, suspected stone disease, diarrhea, history diabetes mellitus, hypertension, smoker EXAM: CT ABDOMEN AND PELVIS WITHOUT CONTRAST TECHNIQUE: Multidetector CT imaging of the abdomen and pelvis was performed following the standard protocol without IV contrast. Sagittal and coronal MPR images reconstructed from axial data set. No oral contrast administered.  COMPARISON:  05/27/2018 FINDINGS: Lower chest: Lung bases clear Hepatobiliary: Gallbladder surgically absent.  Liver unremarkable. Pancreas: Normal appearance Spleen: Normal appearance Adrenals/Urinary Tract: Adrenal glands normal appearance. Tiny BILATERAL nonobstructing renal calculi. No renal mass, hydronephrosis or hydroureter. Bladder unremarkable. Stomach/Bowel: Normal appendix. Stomach and bowel loops normal appearance. Vascular/Lymphatic: Aorta normal caliber. No adenopathy. Scattered pelvic phleboliths. Reproductive: Unremarkable uterus and adnexa. Small amount of air in vagina. Other: No free air or free fluid. No hernia or inflammatory process. Musculoskeletal: No acute osseous findings. IMPRESSION: No acute intra-abdominal or intrapelvic abnormalities. Tiny BILATERAL nonobstructing renal calculi. Electronically Signed   By: Lavonia Dana M.D.   On: 07/22/2019 17:10    Procedures Procedures (including critical care time)  Medications Ordered in ED Medications  ketorolac (TORADOL) 30 MG/ML injection 30 mg (30 mg Intravenous Refused 07/22/19 1623)  acetaminophen (TYLENOL) tablet 650 mg (650 mg Oral Given 07/22/19 1849)     Initial Impression / Assessment and Plan / ED Course  I have reviewed the triage vital signs and the nursing notes.  Pertinent labs & imaging results that were  available during my care of the patient were reviewed by me and considered in my medical decision making (see chart for details).        43 year old female who presents for evaluation of 3 days of right lower flank pain.  She states she saw her primary care who advised she come to the emergency department for further evaluation.  No preceding trauma, injury, fall.  She does report that she had a fall about a month and a half ago but has had some residual back pain since then.  Her flank pain and right side abdominal pain did not start until 3 days ago.  No fevers, urinary complaints, vomiting but has had some nausea.  Her pain is worse with movement.  Consider kidney stone versus UTI.  Also consider musculoskeletal etiology given it is worse with bending and movement.  History/physical exam not concerning for cauda equina, spinal abscess.  Doubt appendicitis.  History/physical exam concerning for ovarian etiology.  History/physical exam not concerning for aortic dissection.  Will plan to check labs.  Lipase is normal.  CMP shows glucose of 372.  Patient with known history of diabetes.  CBC without any significant leukocytosis or anemia.  UA shows evidence of infection.  Urine pregnancy negative.  CT reviewed.  No acute intra-abdominal process.  She does have biteral nonobstructing renal calculi.  This could be contributing to her pain but I doubt it.  I suspect her pain is most likely musculoskeletal in nature since it is worse with bending and movement.  Discussed results with patient.  This time, will plan to treat as musculoskeletal etiology.  Plan for short course of Robaxin. At this time, patient exhibits no emergent life-threatening condition that require further evaluation in ED or admission. Patient had ample opportunity for questions and discussion. All patient's questions were answered with full understanding. Strict return precautions discussed. Patient expresses understanding and agreement to  plan.   Portions of this note were generated with [employer]. Dictation errors may occur despite best attempts at proofreading.  Final Clinical Impressions(s) / ED Diagnoses   Final diagnoses:  Flank pain  Left lower quadrant abdominal pain    ED Discharge Orders         Ordered    methocarbamol (ROBAXIN) 500 MG tablet  2 times daily     07/22/19 1935  Volanda Napoleon, PA-C 07/22/19 2223    Blanchie Dessert, MD 07/22/19 2342

## 2019-07-22 NOTE — Discharge Instructions (Signed)
Take Robaxin as prescribed. This medication will make you drowsy so do not drive or drink alcohol when taking it.  Follow-up with your primary care doctor.  Return to the emergency department for any worsening pain, chest pain, fevers or any other worsening or concerning symptoms.

## 2019-08-06 ENCOUNTER — Ambulatory Visit (HOSPITAL_BASED_OUTPATIENT_CLINIC_OR_DEPARTMENT_OTHER): Payer: Medicare Other

## 2019-08-18 ENCOUNTER — Encounter (HOSPITAL_BASED_OUTPATIENT_CLINIC_OR_DEPARTMENT_OTHER): Payer: Self-pay

## 2019-08-18 ENCOUNTER — Ambulatory Visit (HOSPITAL_BASED_OUTPATIENT_CLINIC_OR_DEPARTMENT_OTHER)
Admission: RE | Admit: 2019-08-18 | Discharge: 2019-08-18 | Disposition: A | Discharge status: Home / Self Care | Payer: Medicare Other | Source: Ambulatory Visit | Attending: Internal Medicine | Admitting: Internal Medicine

## 2019-08-18 ENCOUNTER — Other Ambulatory Visit: Payer: Self-pay

## 2019-08-18 DIAGNOSIS — Z1231 Encounter for screening mammogram for malignant neoplasm of breast: Secondary | ICD-10-CM | POA: Insufficient documentation

## 2019-12-29 ENCOUNTER — Encounter (HOSPITAL_COMMUNITY): Payer: Self-pay

## 2019-12-29 ENCOUNTER — Emergency Department (HOSPITAL_COMMUNITY): Payer: Medicare Other

## 2019-12-29 ENCOUNTER — Emergency Department (HOSPITAL_COMMUNITY)
Admission: EM | Admit: 2019-12-29 | Discharge: 2019-12-29 | Disposition: A | Discharge status: Home / Self Care | Payer: Medicare Other | Attending: Emergency Medicine | Admitting: Emergency Medicine

## 2019-12-29 DIAGNOSIS — F17210 Nicotine dependence, cigarettes, uncomplicated: Secondary | ICD-10-CM | POA: Insufficient documentation

## 2019-12-29 DIAGNOSIS — Z794 Long term (current) use of insulin: Secondary | ICD-10-CM | POA: Diagnosis not present

## 2019-12-29 DIAGNOSIS — I10 Essential (primary) hypertension: Secondary | ICD-10-CM | POA: Insufficient documentation

## 2019-12-29 DIAGNOSIS — N938 Other specified abnormal uterine and vaginal bleeding: Secondary | ICD-10-CM | POA: Diagnosis not present

## 2019-12-29 DIAGNOSIS — E119 Type 2 diabetes mellitus without complications: Secondary | ICD-10-CM | POA: Insufficient documentation

## 2019-12-29 DIAGNOSIS — N939 Abnormal uterine and vaginal bleeding, unspecified: Secondary | ICD-10-CM

## 2019-12-29 DIAGNOSIS — R102 Pelvic and perineal pain: Secondary | ICD-10-CM | POA: Diagnosis present

## 2019-12-29 LAB — CBC WITH DIFFERENTIAL/PLATELET
Abs Immature Granulocytes: 0.03 10*3/uL (ref 0.00–0.07)
Basophils Absolute: 0.1 10*3/uL (ref 0.0–0.1)
Basophils Relative: 1 %
Eosinophils Absolute: 0.1 10*3/uL (ref 0.0–0.5)
Eosinophils Relative: 1 %
HCT: 47.4 % — ABNORMAL HIGH (ref 36.0–46.0)
Hemoglobin: 15.3 g/dL — ABNORMAL HIGH (ref 12.0–15.0)
Immature Granulocytes: 0 %
Lymphocytes Relative: 34 %
Lymphs Abs: 3.4 10*3/uL (ref 0.7–4.0)
MCH: 29.7 pg (ref 26.0–34.0)
MCHC: 32.3 g/dL (ref 30.0–36.0)
MCV: 92 fL (ref 80.0–100.0)
Monocytes Absolute: 0.7 10*3/uL (ref 0.1–1.0)
Monocytes Relative: 7 %
Neutro Abs: 5.7 10*3/uL (ref 1.7–7.7)
Neutrophils Relative %: 57 %
Platelets: 449 10*3/uL — ABNORMAL HIGH (ref 150–400)
RBC: 5.15 MIL/uL — ABNORMAL HIGH (ref 3.87–5.11)
RDW: 12 % (ref 11.5–15.5)
WBC: 10 10*3/uL (ref 4.0–10.5)
nRBC: 0 % (ref 0.0–0.2)

## 2019-12-29 LAB — WET PREP, GENITAL
Clue Cells Wet Prep HPF POC: NONE SEEN
Sperm: NONE SEEN
Trich, Wet Prep: NONE SEEN
WBC, Wet Prep HPF POC: NONE SEEN
Yeast Wet Prep HPF POC: NONE SEEN

## 2019-12-29 LAB — I-STAT BETA HCG BLOOD, ED (MC, WL, AP ONLY): I-stat hCG, quantitative: <5 m[IU]/mL (ref <5)

## 2019-12-29 LAB — BASIC METABOLIC PANEL
Anion gap: 11 (ref 5–15)
BUN: 14 mg/dL (ref 6–20)
CO2: 26 mmol/L (ref 22–32)
Calcium: 9.7 mg/dL (ref 8.9–10.3)
Chloride: 96 mmol/L — ABNORMAL LOW (ref 98–111)
Creatinine, Ser: 0.71 mg/dL (ref 0.44–1.00)
GFR calc Af Amer: >60 mL/min (ref >60)
GFR calc non Af Amer: >60 mL/min (ref >60)
Glucose, Bld: 472 mg/dL — ABNORMAL HIGH (ref 70–99)
Potassium: 4.3 mmol/L (ref 3.5–5.1)
Sodium: 133 mmol/L — ABNORMAL LOW (ref 135–145)

## 2019-12-29 NOTE — ED Notes (Signed)
Pt given crackers and gingerale.

## 2019-12-29 NOTE — Discharge Instructions (Signed)
Please read and follow all provided instructions.  Your diagnoses today include:  1. Abnormal uterine bleeding    Tests performed today include:  Blood counts and electrolytes -normal red and white blood cell count, sugar elevated in the 400s.  Wet prep -no vaginal infection  Ultrasound of the pelvis - Without any concerning findings  Vital signs. See below for your results today.   Medications prescribed:   None  Take any prescribed medications only as directed.  Home care instructions:  Follow any educational materials contained in this packet.  Follow-up instructions: Please follow-up with your primary care provider in the next 2-3 days for further evaluation of your symptoms.  It will probably be beneficial as well for you to follow-up with your OB/GYN.  Return instructions:   Please return to the Emergency Department if you experience worsening symptoms.   Return with uncontrolled or severe pain, heavier amounts of vaginal bleeding soaking through a tampon more than once every 1-2 hours.  Please return if you have any other emergent concerns.  Additional Information:  Your vital signs today were: BP 127/77 (BP Location: Left Arm)   Pulse 92   Temp 98.7 F (37.1 C) (Oral)   Resp 15   LMP 12/19/2019   SpO2 99%  If your blood pressure (BP) was elevated above 135/85 this visit, please have this repeated by your doctor within one month. --------------

## 2019-12-29 NOTE — ED Notes (Signed)
Pt ambulatory to the BR

## 2019-12-29 NOTE — ED Notes (Signed)
Lab notified of need to add on CBC and BMP.

## 2019-12-29 NOTE — ED Notes (Signed)
Pt ambulatory from triage 

## 2019-12-29 NOTE — ED Notes (Signed)
Pt verbalized discharge instructions and follow up care. Alert and ambulatory. No IV.  

## 2019-12-29 NOTE — ED Triage Notes (Addendum)
Patients reports vaginal bleeding since the 2nd of January that started off with spotting and has gradually worsened with small clots.   Patient reports in December her period started 9 days earlier and lasted 6 days.   C/O left lower abdominal cramping and fatigue since December   Patient reports seeing PCP for same yesterday and told to go to ED.   A/Ox4  Hx. Uterine Fibroid   BP-139/94  Patient reports blood work taken on the 5th.

## 2019-12-29 NOTE — ED Provider Notes (Signed)
[facility] DEPT Provider Note   CSN: [id number] Arrival date & time: 12/29/19  1444     History Chief Complaint  Patient presents with  . Vaginal Bleeding  . Abdominal Pain    Phyllis Pittman is a 44 y.o. female.  Patient with history of diabetes presents to the emergency department with complaint of lower left abdominal pain and pelvic pain with associated vaginal bleeding.  Patient states that vaginal bleeding started on January 2.  She had atypical bleeding in the mid December timeframe.  Bleeding is heavy at times, requiring replacement of tampon every few hours.  Leading has been more minimal today.  She has a cramping pain in the left lower quadrant and left pelvic area.  Patient has had nausea and fatigue as well as generalized weakness.  She has had some subjective shortness of breath.  No lightheadedness or syncope.  No chest pain.  No diarrhea.  No urinary symptoms.        Past Medical History:  Diagnosis Date  . Anxiety   . Cancer ([place])   . Diabetes mellitus without complication ([place])   . Hypertension   . Sciatica   . Shingles     There are no problems to display for this patient.   History reviewed. No pertinent surgical history.   OB History   No obstetric history on file.     History reviewed. No pertinent family history.  Social History   Tobacco Use  . Smoking status: Current Every Day Smoker    Types: Cigarettes  . Smokeless tobacco: Never Used  Substance Use Topics  . Alcohol use: Yes    Comment: occ  . Drug use: No    Home Medications Prior to Admission medications   Medication Sig Start Date End Date Taking? Authorizing Provider  cephALEXin (KEFLEX) 500 MG capsule Take 1 capsule (500 mg total) by mouth 3 (three) times daily. 02/22/18   Kirichenko, Tatyana, PA-C  insulin aspart (NOVOLOG) 100 UNIT/ML injection Inject into the skin 3 (three) times daily before meals.    [provider]  insulin  detemir (LEVEMIR) 100 UNIT/ML injection Inject into the skin daily.    [provider]  methocarbamol (ROBAXIN) 500 MG tablet Take 1 tablet (500 mg total) by mouth 2 (two) times daily. 07/22/19   Volanda Napoleon, PA-C  naproxen (NAPROSYN) 500 MG tablet Take 500 mg by mouth 2 (two) times daily with a meal.    [provider]  ondansetron (ZOFRAN ODT) 8 MG disintegrating tablet Take 1 tablet (8 mg total) by mouth every 8 (eight) hours as needed for nausea or vomiting. 02/22/18   Kirichenko, Tatyana, PA-C  tiZANidine (ZANAFLEX) 2 MG tablet Take by mouth every 6 (six) hours as needed for muscle spasms.    [provider]    Allergies    Famotidine, Levaquin [levofloxacin], and Trulicity [dulaglutide]  Review of Systems   Review of Systems  Constitutional: Positive for fatigue. Negative for fever.  HENT: Negative for rhinorrhea and sore throat.   Eyes: Negative for redness.  Respiratory: Positive for shortness of breath. Negative for cough.   Cardiovascular: Negative for chest pain.  Gastrointestinal: Positive for abdominal pain. Negative for diarrhea, nausea and vomiting.  Genitourinary: Positive for pelvic pain and vaginal bleeding. Negative for dysuria.  Musculoskeletal: Negative for myalgias.  Skin: Negative for rash.  Neurological: Positive for weakness. Negative for syncope, light-headedness and headaches.    Physical Exam Updated Vital Signs BP (!) 139/94  Pulse (!) 104   Temp 98.3 F (36.8 C) (Oral)   Resp 18   LMP 12/19/2019   SpO2 98%   Physical Exam Vitals and nursing note reviewed. Exam conducted with a chaperone present.  Constitutional:      Appearance: She is well-developed.  HENT:     Head: Normocephalic and atraumatic.  Eyes:     General:        Right eye: No discharge.        Left eye: No discharge.     Conjunctiva/sclera: Conjunctivae normal.  Cardiovascular:     Rate and Rhythm: Normal rate and regular rhythm.     Heart sounds:  Normal heart sounds.  Pulmonary:     Effort: Pulmonary effort is normal.     Breath sounds: Normal breath sounds.  Abdominal:     Palpations: Abdomen is soft.     Tenderness: There is abdominal tenderness in the periumbilical area, suprapubic area and left lower quadrant. There is no guarding or rebound.  Genitourinary:    Exam position: Lithotomy position.     Labia:        Right: No lesion.        Left: No lesion.      Vagina: Bleeding present.     Cervix: Cervical bleeding (mild) present.     Uterus: Normal. Not tender.      Adnexa: Right adnexa normal.       Right: No mass, tenderness or fullness.         Left: Tenderness present. No mass or fullness.       Comments: Patient with mild left-sided adnexal tenderness on exam most severe in the pelvis.  Patient is mildly tender with palpation on the lower abdominal wall. Musculoskeletal:     Cervical back: Normal range of motion and neck supple.  Skin:    General: Skin is warm and dry.  Neurological:     Mental Status: She is alert.     ED Results / Procedures / Treatments   Labs (all labs ordered are listed, but only abnormal results are displayed) Labs Reviewed  CBC WITH DIFFERENTIAL/PLATELET - Abnormal; Notable for the following components:      Result Value   RBC 5.15 (*)    Hemoglobin 15.3 (*)    HCT 47.4 (*)    Platelets 449 (*)    All other components within normal limits  BASIC METABOLIC PANEL - Abnormal; Notable for the following components:   Sodium 133 (*)    Chloride 96 (*)    Glucose, Bld 472 (*)    All other components within normal limits  WET PREP, GENITAL  I-STAT BETA HCG BLOOD, ED ([facility], [facility], [facility] ONLY)  GC/CHLAMYDIA PROBE AMP ([place]) NOT AT [facility]    EKG None  Radiology US Transvaginal Non-OB  Result Date: 12/29/2019 CLINICAL DATA:  Vaginal bleeding EXAM: TRANSABDOMINAL AND TRANSVAGINAL ULTRASOUND OF PELVIS TECHNIQUE: Both transabdominal and transvaginal ultrasound examinations of the pelvis  were performed. Transabdominal technique was performed for global imaging of the pelvis including uterus, ovaries, adnexal regions, and pelvic cul-de-sac. It was necessary to proceed with endovaginal exam following the transabdominal exam to visualize the uterus, endometrium, ovaries and adnexa. COMPARISON:  CT 11/10/2019 FINDINGS: Uterus Measurements: 10.6 x 5.0 x 5.6 cm = volume: 156 mL. Heterogeneous echotexture throughout the myometrium. Endometrium Thickness: 4 mm in thickness.  No focal abnormality visualized. Right ovary Measurements: 2.7 x 2.0 x 2.1 cm = volume: 5.8 mL. Normal appearance/no adnexal  mass. Left ovary Measurements: 2.2 x 1.5 x 2.1 cm = volume: 3.5 mL. Normal appearance/no adnexal mass. Other findings No abnormal free fluid. IMPRESSION: Heterogeneous echotexture throughout the uterus can be seen with adenomyosis. No focal fibroid. Normal endometrium. Electronically Signed   By: Rolm Baptise M.D.   On: 12/29/2019 20:06   US Pelvis Complete  Result Date: 12/29/2019 CLINICAL DATA:  Vaginal bleeding EXAM: TRANSABDOMINAL AND TRANSVAGINAL ULTRASOUND OF PELVIS TECHNIQUE: Both transabdominal and transvaginal ultrasound examinations of the pelvis were performed. Transabdominal technique was performed for global imaging of the pelvis including uterus, ovaries, adnexal regions, and pelvic cul-de-sac. It was necessary to proceed with endovaginal exam following the transabdominal exam to visualize the uterus, endometrium, ovaries and adnexa. COMPARISON:  CT 11/10/2019 FINDINGS: Uterus Measurements: 10.6 x 5.0 x 5.6 cm = volume: 156 mL. Heterogeneous echotexture throughout the myometrium. Endometrium Thickness: 4 mm in thickness.  No focal abnormality visualized. Right ovary Measurements: 2.7 x 2.0 x 2.1 cm = volume: 5.8 mL. Normal appearance/no adnexal mass. Left ovary Measurements: 2.2 x 1.5 x 2.1 cm = volume: 3.5 mL. Normal appearance/no adnexal mass. Other findings No abnormal free fluid. IMPRESSION:  Heterogeneous echotexture throughout the uterus can be seen with adenomyosis. No focal fibroid. Normal endometrium. Electronically Signed   By: Rolm Baptise M.D.   On: 12/29/2019 20:06    Procedures Procedures (including critical care time)  Medications Ordered in ED Medications - No data to display  ED Course  I have reviewed the triage vital signs and the nursing notes.  Pertinent labs & imaging results that were available during my care of the patient were reviewed by me and considered in my medical decision making (see chart for details).  Patient seen and examined. Work-up initiated. Normal WBC and slightly high hgb (pt smokes). Will need pelvic US and pelvic exam.   Vital signs reviewed and are as follows: BP (!) 139/94   Pulse (!) 104   Temp 98.3 F (36.8 C) (Oral)   Resp 18   LMP 12/19/2019   SpO2 98%   8:57 PM ultrasound performed.  Patient updated on results.  We discussed lab work results.  Pelvic exam performed with NT chaperone, Sam.  Patient has a mild amount of bleeding in the vaginal vault.  No clots noted.  Bleeding is minimal at this time and appears to be coming from the cervix.  Uterus is retroverted.  9:18 PM wet prep is negative for infection.  Patient is stable.  She will need PCP/OB/GYN follow-up.  We discussed signs and symptoms for her to return.    MDM Rules/Calculators/A&P                      Patient with left pelvic and lower quadrant abdominal pain in setting of abnormal vaginal bleeding.  Her red blood cell count is slightly high likely due to her smoking.  She does not require blood transfusion and is not symptomatic from an anemia standpoint.  Normal white blood cell count.  I have low concern for a left lower quadrant abdominal process such as diverticulitis or colitis.  She has not had significant diarrhea.  Suspect pelvic etiology of her pain.  She was evaluated with pelvic exam as well as pelvic ultrasound which did not show any concerning  findings.  She has PCP and OB/GYN follow-up.  We will discharged home at this point with return structures as above.  Final Clinical Impression(s) / ED Diagnoses Final diagnoses:  Abnormal  uterine bleeding    Rx / DC Orders ED Discharge Orders    None       Carlisle Cater, Hershal Coria 12/29/19 2119    Deno Etienne, DO 12/29/19 2122

## 2019-12-31 LAB — GC/CHLAMYDIA PROBE AMP (~~LOC~~) NOT AT ARMC
Chlamydia: NEGATIVE
Neisseria Gonorrhea: NEGATIVE

## 2020-01-15 ENCOUNTER — Inpatient Hospital Stay: Payer: Medicare Other

## 2020-01-15 ENCOUNTER — Inpatient Hospital Stay: Payer: Medicare Other | Attending: Hematology & Oncology | Admitting: Hematology & Oncology

## 2020-02-15 ENCOUNTER — Emergency Department (HOSPITAL_COMMUNITY): Payer: Medicare Other

## 2020-02-15 ENCOUNTER — Emergency Department (HOSPITAL_COMMUNITY)
Admission: EM | Admit: 2020-02-15 | Discharge: 2020-02-16 | Disposition: A | Discharge status: Home / Self Care | Payer: Medicare Other | Attending: Emergency Medicine | Admitting: Emergency Medicine

## 2020-02-15 ENCOUNTER — Emergency Department (HOSPITAL_BASED_OUTPATIENT_CLINIC_OR_DEPARTMENT_OTHER)
Admit: 2020-02-15 | Discharge: 2020-02-15 | Disposition: A | Discharge status: Home / Self Care | Payer: Medicare Other | Attending: Emergency Medicine | Admitting: Emergency Medicine

## 2020-02-15 ENCOUNTER — Other Ambulatory Visit: Payer: Self-pay

## 2020-02-15 ENCOUNTER — Encounter (HOSPITAL_COMMUNITY): Payer: Self-pay

## 2020-02-15 DIAGNOSIS — M79662 Pain in left lower leg: Secondary | ICD-10-CM | POA: Diagnosis not present

## 2020-02-15 DIAGNOSIS — Z79899 Other long term (current) drug therapy: Secondary | ICD-10-CM | POA: Diagnosis not present

## 2020-02-15 DIAGNOSIS — M79609 Pain in unspecified limb: Secondary | ICD-10-CM | POA: Diagnosis not present

## 2020-02-15 DIAGNOSIS — Z91040 Latex allergy status: Secondary | ICD-10-CM | POA: Insufficient documentation

## 2020-02-15 DIAGNOSIS — R12 Heartburn: Secondary | ICD-10-CM | POA: Diagnosis not present

## 2020-02-15 DIAGNOSIS — E119 Type 2 diabetes mellitus without complications: Secondary | ICD-10-CM | POA: Insufficient documentation

## 2020-02-15 DIAGNOSIS — Z794 Long term (current) use of insulin: Secondary | ICD-10-CM | POA: Diagnosis not present

## 2020-02-15 DIAGNOSIS — R7989 Other specified abnormal findings of blood chemistry: Secondary | ICD-10-CM | POA: Diagnosis not present

## 2020-02-15 DIAGNOSIS — R519 Headache, unspecified: Secondary | ICD-10-CM | POA: Insufficient documentation

## 2020-02-15 DIAGNOSIS — I10 Essential (primary) hypertension: Secondary | ICD-10-CM | POA: Insufficient documentation

## 2020-02-15 DIAGNOSIS — R072 Precordial pain: Secondary | ICD-10-CM | POA: Diagnosis not present

## 2020-02-15 DIAGNOSIS — R0602 Shortness of breath: Secondary | ICD-10-CM | POA: Insufficient documentation

## 2020-02-15 DIAGNOSIS — Z859 Personal history of malignant neoplasm, unspecified: Secondary | ICD-10-CM | POA: Insufficient documentation

## 2020-02-15 DIAGNOSIS — F419 Anxiety disorder, unspecified: Secondary | ICD-10-CM | POA: Insufficient documentation

## 2020-02-15 DIAGNOSIS — R0789 Other chest pain: Secondary | ICD-10-CM | POA: Diagnosis present

## 2020-02-15 DIAGNOSIS — R110 Nausea: Secondary | ICD-10-CM | POA: Insufficient documentation

## 2020-02-15 DIAGNOSIS — F17210 Nicotine dependence, cigarettes, uncomplicated: Secondary | ICD-10-CM | POA: Diagnosis not present

## 2020-02-15 LAB — LIPASE, BLOOD: Lipase: 46 U/L (ref 11–51)

## 2020-02-15 LAB — CBC WITH DIFFERENTIAL/PLATELET
Abs Immature Granulocytes: 0.05 10*3/uL (ref 0.00–0.07)
Basophils Absolute: 0.1 10*3/uL (ref 0.0–0.1)
Basophils Relative: 0 %
Eosinophils Absolute: 0.1 10*3/uL (ref 0.0–0.5)
Eosinophils Relative: 1 %
HCT: 42.2 % (ref 36.0–46.0)
Hemoglobin: 14.1 g/dL (ref 12.0–15.0)
Immature Granulocytes: 0 %
Lymphocytes Relative: 21 %
Lymphs Abs: 2.9 10*3/uL (ref 0.7–4.0)
MCH: 30.6 pg (ref 26.0–34.0)
MCHC: 33.4 g/dL (ref 30.0–36.0)
MCV: 91.5 fL (ref 80.0–100.0)
Monocytes Absolute: 0.9 10*3/uL (ref 0.1–1.0)
Monocytes Relative: 7 %
Neutro Abs: 9.9 10*3/uL — ABNORMAL HIGH (ref 1.7–7.7)
Neutrophils Relative %: 71 %
Platelets: 408 10*3/uL — ABNORMAL HIGH (ref 150–400)
RBC: 4.61 MIL/uL (ref 3.87–5.11)
RDW: 12.3 % (ref 11.5–15.5)
WBC: 13.9 10*3/uL — ABNORMAL HIGH (ref 4.0–10.5)
nRBC: 0 % (ref 0.0–0.2)

## 2020-02-15 LAB — I-STAT BETA HCG BLOOD, ED (MC, WL, AP ONLY): I-stat hCG, quantitative: <5 m[IU]/mL (ref <5)

## 2020-02-15 LAB — COMPREHENSIVE METABOLIC PANEL
ALT: 78 U/L — ABNORMAL HIGH (ref 0–44)
AST: 233 U/L — ABNORMAL HIGH (ref 15–41)
Albumin: 3.4 g/dL — ABNORMAL LOW (ref 3.5–5.0)
Alkaline Phosphatase: 71 U/L (ref 38–126)
Anion gap: 10 (ref 5–15)
BUN: 13 mg/dL (ref 6–20)
CO2: 27 mmol/L (ref 22–32)
Calcium: 8.5 mg/dL — ABNORMAL LOW (ref 8.9–10.3)
Chloride: 97 mmol/L — ABNORMAL LOW (ref 98–111)
Creatinine, Ser: 0.53 mg/dL (ref 0.44–1.00)
GFR calc Af Amer: >60 mL/min (ref >60)
GFR calc non Af Amer: >60 mL/min (ref >60)
Glucose, Bld: 355 mg/dL — ABNORMAL HIGH (ref 70–99)
Potassium: 4.9 mmol/L (ref 3.5–5.1)
Sodium: 134 mmol/L — ABNORMAL LOW (ref 135–145)
Total Bilirubin: 1.3 mg/dL — ABNORMAL HIGH (ref 0.3–1.2)
Total Protein: 6.2 g/dL — ABNORMAL LOW (ref 6.5–8.1)

## 2020-02-15 LAB — TROPONIN I (HIGH SENSITIVITY)
Troponin I (High Sensitivity): <2 ng/L (ref <18)
Troponin I (High Sensitivity): 3 ng/L (ref <18)

## 2020-02-15 LAB — D-DIMER, QUANTITATIVE
D-Dimer, Quant: 0.3 ug/mL-FEU (ref 0.00–0.50)
D-Dimer, Quant: <0.27 ug/mL-FEU (ref 0.00–0.50)

## 2020-02-15 MED ORDER — LIDOCAINE VISCOUS HCL 2 % MT SOLN
15.0000 mL | Freq: Once | OROMUCOSAL | Status: AC
  Administered 2020-02-15: 15 mL via ORAL
  Filled 2020-02-15: qty 15

## 2020-02-15 MED ORDER — SODIUM CHLORIDE 0.9 % IV BOLUS
1000.0000 mL | Freq: Once | INTRAVENOUS | Status: AC
  Administered 2020-02-15: 1000 mL via INTRAVENOUS

## 2020-02-15 MED ORDER — MORPHINE SULFATE (PF) 4 MG/ML IV SOLN
4.0000 mg | Freq: Once | INTRAVENOUS | Status: AC
  Administered 2020-02-15: 4 mg via INTRAVENOUS
  Filled 2020-02-15: qty 1

## 2020-02-15 MED ORDER — ALUM & MAG HYDROXIDE-SIMETH 200-200-20 MG/5ML PO SUSP
30.0000 mL | Freq: Once | ORAL | Status: AC
  Administered 2020-02-15: 30 mL via ORAL
  Filled 2020-02-15: qty 30

## 2020-02-15 NOTE — ED Provider Notes (Signed)
[facility] Provider Note   CSN: [id number] Arrival date & time: 02/15/20  1642     History Chief Complaint  Patient presents with  . Chest Pain    Phyllis Pittman is a 44 y.o. female.  The history is provided by the patient and medical records. No language interpreter was used.  Chest Pain Pain location:  Substernal area Pain quality: aching, crushing, pressure and tightness   Pain radiates to:  R shoulder Pain severity:  Severe Onset quality:  Gradual Timing:  Constant Progression:  Waxing and waning Chronicity:  Recurrent Context: at rest   Relieved by:  Nitroglycerin Worsened by:  Deep breathing, exertion and certain positions Ineffective treatments:  None tried Associated symptoms: anxiety, headache, heartburn, nausea and shortness of breath   Associated symptoms: no abdominal pain, no back pain, no claudication, no cough, no diaphoresis, no dizziness, no dysphagia, no fatigue, no fever, no lower extremity edema, no near-syncope, no numbness, no palpitations, no syncope, no vomiting and no weakness        Past Medical History:  Diagnosis Date  . Anxiety   . Cancer ([place])   . Diabetes mellitus without complication ([place])   . Hypertension   . Sciatica   . Shingles     There are no problems to display for this patient.   History reviewed. No pertinent surgical history.   OB History   No obstetric history on file.     History reviewed. No pertinent family history.  Social History   Tobacco Use  . Smoking status: Current Every Day Smoker    Types: Cigarettes  . Smokeless tobacco: Never Used  Substance Use Topics  . Alcohol use: Yes    Comment: occ  . Drug use: No    Home Medications Prior to Admission medications   Medication Sig Start Date End Date Taking? Authorizing Provider  cephALEXin (KEFLEX) 500 MG capsule Take 1 capsule (500 mg total) by mouth 3 (three) times daily. 02/22/18   Kirichenko, Tatyana, PA-C    insulin aspart (NOVOLOG) 100 UNIT/ML injection Inject into the skin 3 (three) times daily before meals.    [provider]  insulin detemir (LEVEMIR) 100 UNIT/ML injection Inject into the skin daily.    [provider]  methocarbamol (ROBAXIN) 500 MG tablet Take 1 tablet (500 mg total) by mouth 2 (two) times daily. 07/22/19   Volanda Napoleon, PA-C  naproxen (NAPROSYN) 500 MG tablet Take 500 mg by mouth 2 (two) times daily with a meal.    [provider]  ondansetron (ZOFRAN ODT) 8 MG disintegrating tablet Take 1 tablet (8 mg total) by mouth every 8 (eight) hours as needed for nausea or vomiting. 02/22/18   Kirichenko, Tatyana, PA-C  tiZANidine (ZANAFLEX) 2 MG tablet Take by mouth every 6 (six) hours as needed for muscle spasms.    [provider]    Allergies    Molds & smuts, Prednisone, Venlafaxine, Latex, Metformin, Codeine, Famotidine, Levaquin [levofloxacin], Semaglutide, and Trulicity [dulaglutide]  Review of Systems   Review of Systems  Constitutional: Negative for chills, diaphoresis, fatigue and fever.  HENT: Negative for congestion, rhinorrhea and trouble swallowing.   Eyes: Negative for visual disturbance.  Respiratory: Positive for chest tightness and shortness of breath. Negative for cough, wheezing and stridor.   Cardiovascular: Positive for chest pain. Negative for palpitations, claudication, leg swelling, syncope and near-syncope.  Gastrointestinal: Positive for heartburn and nausea. Negative for abdominal pain, constipation, diarrhea and vomiting.  Genitourinary: Negative for flank pain and frequency.  Musculoskeletal: Negative for back pain, neck pain and neck stiffness.  Skin: Negative for rash and wound.  Neurological: Positive for headaches. Negative for dizziness, weakness, light-headedness and numbness.  Psychiatric/Behavioral: Negative for agitation.    Physical Exam Updated Vital Signs BP 100/66 (BP Location: Right Arm)   Pulse  90   Temp 98.3 F (36.8 C) (Oral)   Resp 11   Ht 5' (1.524 m)   Wt 63.5 kg   LMP 02/14/2020   SpO2 96%   BMI 27.34 kg/m   Physical Exam Vitals and nursing note reviewed.  Constitutional:      General: She is not in acute distress.    Appearance: She is well-developed. She is not ill-appearing, toxic-appearing or diaphoretic.  HENT:     Head: Normocephalic and atraumatic.  Eyes:     Conjunctiva/sclera: Conjunctivae normal.     Pupils: Pupils are equal, round, and reactive to light.  Cardiovascular:     Rate and Rhythm: Normal rate and regular rhythm.     Heart sounds: No murmur.  Pulmonary:     Effort: Pulmonary effort is normal. No tachypnea or respiratory distress.     Breath sounds: Normal breath sounds. No decreased breath sounds, wheezing or rhonchi.  Chest:     Chest wall: No tenderness.  Abdominal:     Palpations: Abdomen is soft.     Tenderness: There is no abdominal tenderness.  Musculoskeletal:     Cervical back: Normal range of motion and neck supple.     Right lower leg: No tenderness. No edema.     Left lower leg: Tenderness present. No edema.  Skin:    General: Skin is warm and dry.     Capillary Refill: Capillary refill takes less than 2 seconds.     Findings: No ecchymosis or erythema.  Neurological:     General: No focal deficit present.     Mental Status: She is alert.  Psychiatric:        Mood and Affect: Mood is anxious.     ED Results / Procedures / Treatments   Labs (all labs ordered are listed, but only abnormal results are displayed) Labs Reviewed  CBC WITH DIFFERENTIAL/PLATELET - Abnormal; Notable for the following components:      Result Value   WBC 13.9 (*)    Platelets 408 (*)    Neutro Abs 9.9 (*)    All other components within normal limits  COMPREHENSIVE METABOLIC PANEL - Abnormal; Notable for the following components:   Sodium 134 (*)    Chloride 97 (*)    Glucose, Bld 355 (*)    Calcium 8.5 (*)    Total Protein 6.2 (*)     Albumin 3.4 (*)    AST 233 (*)    ALT 78 (*)    Total Bilirubin 1.3 (*)    All other components within normal limits  URINE CULTURE  LIPASE, BLOOD  D-DIMER, QUANTITATIVE (NOT AT [facility])  URINALYSIS, ROUTINE W REFLEX MICROSCOPIC  I-STAT BETA HCG BLOOD, ED ([facility], [facility], [facility] ONLY)  TROPONIN I (HIGH SENSITIVITY)  TROPONIN I (HIGH SENSITIVITY)    EKG EKG Interpretation  Date/Time:  Monday February 15 2020 16:48:11 EST Ventricular Rate:  91 PR Interval:    QRS Duration: 82 QT Interval:  354 QTC Calculation: 436 R Axis:   40 Text Interpretation: Sinus rhythm When comapred to prior, less wandering baseline. No STEMI Confirmed by Antony Blackbird [phone]) on 02/15/2020  4:53:19 PM   Radiology DG Chest 2 View  Result Date: 02/15/2020 CLINICAL DATA:  Chest pain EXAM: CHEST - 2 VIEW COMPARISON:  07/16/2016 FINDINGS: The heart size and mediastinal contours are within normal limits. Both lungs are clear. Probable calcified loose body or bulky tendinous calcification along the right humeral head. IMPRESSION: No active cardiopulmonary disease. Electronically Signed   By: Donavan Foil M.D.   On: 02/15/2020 19:55   VAS [place] LOWER EXTREMITY VENOUS (DVT) (ONLY [facility] & [facility])  Result Date: 02/15/2020  Lower Venous DVTStudy Indications: Pain.  Risk Factors: None identified. Limitations: Poor ultrasound/tissue interface and patient pain tolerance. Comparison Study: No prior studies. Performing Technologist: Oliver Hum RVT  Examination Guidelines: A complete evaluation includes B-mode imaging, spectral Doppler, color Doppler, and power Doppler as needed of all accessible portions of each vessel. Bilateral testing is considered an integral part of a complete examination. Limited examinations for reoccurring indications may be performed as noted. The reflux portion of the exam is performed with the patient in reverse Trendelenburg.  +-----+---------------+---------+-----------+----------+--------------+  RIGHTCompressibilityPhasicitySpontaneityPropertiesThrombus Aging +-----+---------------+---------+-----------+----------+--------------+ CFV  Full           Yes      Yes                                 +-----+---------------+---------+-----------+----------+--------------+   +---------+---------------+---------+-----------+----------+--------------+ LEFT     CompressibilityPhasicitySpontaneityPropertiesThrombus Aging +---------+---------------+---------+-----------+----------+--------------+ CFV      Full           Yes      Yes                                 +---------+---------------+---------+-----------+----------+--------------+ SFJ      Full                                                        +---------+---------------+---------+-----------+----------+--------------+ FV Prox  Full                                                        +---------+---------------+---------+-----------+----------+--------------+ FV Mid   Full                                                        +---------+---------------+---------+-----------+----------+--------------+ FV Distal               Yes      Yes                                 +---------+---------------+---------+-----------+----------+--------------+ PFV      Full                                                        +---------+---------------+---------+-----------+----------+--------------+  POP      Full           Yes      Yes                                 +---------+---------------+---------+-----------+----------+--------------+ PTV      Full                                                        +---------+---------------+---------+-----------+----------+--------------+ PERO     Full                                                        +---------+---------------+---------+-----------+----------+--------------+     Summary: RIGHT: - No evidence of common femoral vein  obstruction.  LEFT: - There is no evidence of deep vein thrombosis in the lower extremity. However, portions of this examination were limited- see technologist comments above.  - No cystic structure found in the popliteal fossa.  *See table(s) above for measurements and observations.    Preliminary    US Abdomen Limited RUQ  Result Date: 02/15/2020 CLINICAL DATA:  Elevated LFTs EXAM: ULTRASOUND ABDOMEN LIMITED RIGHT UPPER QUADRANT COMPARISON:  None. FINDINGS: Gallbladder: The patient is status post cholecystectomy. No biliary ductal dilation. Common bile duct: Diameter: 6.8mm Liver: Increased echotexture seen throughout. No focal abnormality or biliary ductal dilatation. Portal vein is patent on color Doppler imaging with normal direction of blood flow towards the liver. Other: None. IMPRESSION: Hepatic steatosis.  Status post cholecystectomy. Electronically Signed   By: Prudencio Pair M.D.   On: 02/15/2020 22:23    Procedures Procedures (including critical care time)  Medications Ordered in ED Medications  alum & mag hydroxide-simeth (MAALOX/MYLANTA) 200-200-20 MG/5ML suspension 30 mL (30 mLs Oral Given 02/15/20 1759)    And  lidocaine (XYLOCAINE) 2 % viscous mouth solution 15 mL (15 mLs Oral Given 02/15/20 1759)  sodium chloride 0.9 % bolus 1,000 mL (0 mLs Intravenous Stopped 02/15/20 1959)  morphine 4 MG/ML injection 4 mg (4 mg Intravenous Given 02/15/20 1759)    ED Course  I have reviewed the triage vital signs and the nursing notes.  Pertinent labs & imaging results that were available during my care of the patient were reviewed by me and considered in my medical decision making (see chart for details).    MDM Rules/Calculators/A&P                      Lakaiya Aynes is a 44 y.o. female with a past medical history significant for diabetes, hypertension, tobacco abuse, anxiety, and report of prior TIA who presents with chest pain and shortness of breath.  Patient reports that she chronically  has chest discomfort and shortness of breath is been ongoing for months however she reports that over the last few days at his change in worsened including today when she had EMS called on her by her job for her chest pain.  She reports that she has had an increase in anxiety and stress with the loss of her mother and increasing family stress.  She reports  that today she started having pain across her chest that is worse with deep breathing.  It feels like a bandlike pressure.  She also has nausea but no vomiting.  She reports shortness of breath and not be able to take a deep breath.  She denies history of DVT or PE but she does smoke.  There is also documentation of a cancer history although I am unclear what type of cancer she had in the past.  She reports no significant fevers or chills and denies any cough.  She denies any Covid contacts.  She denies any trauma.  She denies any palpitations.  She reports that she did have some pain radiate towards her right shoulder and arm but that has improved.  She reports the pain was greater than 10 out of 10 at its worst and is currently slightly improved.  She reports that EMS gave her aspirin and nitroglycerin under her tongue and a nitro paste that improved her discomfort.  She reports that she started her menstrual cycle today and is feeling fatigued.  She reports that she initially thought her symptoms were more indigestion and reflux related however when the pain was worse with breathing she wanted to get evaluated as it felt different.  She does report that she has had some pain in her left calf that has been ongoing for the last few weeks.  On exam, lungs are clear and chest is nontender.  Abdomen is nontender.  Good pulses in all extremities.  No lower extremity tenderness or swelling.  No murmur.  Patient resting comfortably with reassuring vital signs on initial evaluation.  EKG shows no STEMI.  Patient her description of symptoms, she will have work-up  to look for a cardiac or pulmonary etiology of her symptoms.  We will give her a GI cocktail given her history of indigestion type discomfort.  We will also give her some pain medicine as I suspect the headache was brought on by her nitro.  Nitropaste will be removed.  She will have ultrasound to rule out DVT, D-dimer, and other labs.  I suspect symptoms are more related to stress and reflux and anything else however we will do a work-up to look for concerning etiologies.  If work-up is complete reassuring, dissipate discharge home with outpatient follow-up.  DVT study was negative.   Work-up was otherwise reassuring aside from elevated LFTs.  Right upper quadrant ultrasound was done and showed no evidence of biliary obstruction and she does not have a gallbladder.  There was hepatic steatosis.  Patient will follow up with PCP for repeat of LFTs and further management.  She instructed to avoid alcohol and Tylenol.  After full work-up was completed, I have low suspicion for a cardiac or pulmonary etiology.  Troponin negative x2.  EKG was reassuring.  DVT and D-dimer negative.  Clinically I suspect as patient did initially that symptoms are more related to stress and anxiety.  We agreed to have the patient get a prescription for several doses of Ativan and she is already scheduled to see her psychiatrist in 2 days on Wednesday.  Patient will go to this appointment as well as follow-up with her PCP for the LFTs and further monitoring of her symptoms.  Patient's glucose was elevated which we discussed and she will follow up with her primary doctor for further glucose management.  Patient had other questions or concerns and overall appeared well.  She passed p.o. challenge will be discharged for outpatient follow-up.  She understood return precautions and was discharged in good condition.  Final Clinical Impression(s) / ED Diagnoses Final diagnoses:  LFT elevation  Precordial pain  Anxiety  Shortness  of breath    Rx / DC Orders ED Discharge Orders         Ordered    LORazepam (ATIVAN) 0.5 MG tablet  3 times daily PRN     02/16/20 0021         Clinical Impression: 1. Precordial pain   2. LFT elevation   3. Anxiety   4. Shortness of breath     Disposition: Discharge  Condition: Good  I have discussed the results, Dx and Tx plan with the pt(& family if present). He/she/they expressed understanding and agree(s) with the plan. Discharge instructions discussed at great length. Strict return precautions discussed and pt &/or family have verbalized understanding of the instructions. No further questions at time of discharge.    New Prescriptions   LORAZEPAM (ATIVAN) 0.5 MG TABLET    Take 1 tablet (0.5 mg total) by mouth 3 (three) times daily as needed for anxiety.    Follow Up: Trey Sailors, PA [address] [place] [address] [facility] [address] [id number] mc [place] [phone]       Thompson Mckim, Gwenyth Allegra, MD 02/16/20 [phone]

## 2020-02-15 NOTE — Progress Notes (Signed)
Left lower extremity venous duplex has been completed. Preliminary results can be found in CV Proc through chart review.  Results were given to Dr. Sherry Ruffing.  02/15/20 6:13 PM Carlos Levering RVT

## 2020-02-15 NOTE — ED Triage Notes (Signed)
Pt reports chest tightness in the center of the chest that gets worse with movement and breathing. Pt states the pain started 1015 this morning.

## 2020-02-16 MED ORDER — LORAZEPAM 0.5 MG PO TABS: 0.5000 mg | ORAL_TABLET | Freq: Three times a day (TID) | ORAL | 0 refills | Status: DC | PRN

## 2020-02-16 NOTE — ED Notes (Signed)
Pt was discharged from the ED. Pt read and understood discharge paperwork. Pt had vital signs completed. Pt conscious, breathing, and A&Ox4. No distress noted. Pt speaking in complete sentences. Pt brought out of the ED via wheelchair. E-signature not available. 

## 2020-02-16 NOTE — Discharge Instructions (Signed)
Your work-up today was overall reassuring and we did not see any evidence of a cardiac lung, or blood clot cause of your symptoms.  Your liver function was slightly elevated today so please avoid Tylenol and alcohol until you get this rechecked with your primary doctor.  I do suspect there is a component of anxiety and stress contributing to your symptoms as we discussed.  Please use the anxiety medication prescription and go to your psychiatrist appointment on Wednesday.  Please discuss further with them medication management.  If any symptoms change or worsen, please return to the nearest emergency department.

## 2020-10-04 ENCOUNTER — Encounter: Payer: Self-pay | Admitting: *Deleted

## 2020-10-05 ENCOUNTER — Ambulatory Visit (INDEPENDENT_AMBULATORY_CARE_PROVIDER_SITE_OTHER): Payer: Medicare Other | Admitting: Diagnostic Neuroimaging

## 2020-10-05 ENCOUNTER — Encounter: Payer: Self-pay | Admitting: Diagnostic Neuroimaging

## 2020-10-05 VITALS — BP 108/71 | HR 96 | Ht 60.0 in | Wt 158.8 lb

## 2020-10-05 DIAGNOSIS — E1142 Type 2 diabetes mellitus with diabetic polyneuropathy: Secondary | ICD-10-CM | POA: Diagnosis not present

## 2020-10-05 NOTE — Patient Instructions (Signed)
DIABETIC NEUROPATHY - continue diabetes control - continue lyrica, tramadol (per pain mgmt clinic) - consider b-complex daily + alpha-lipoic acid 600mg  twice a day  - consider topical lidocaine and capsaicin cream

## 2020-10-05 NOTE — Progress Notes (Signed)
[facility]  PATIENT: Phyllis Pittman DOB: Jul 20, 1976  REFERRING CLINICIAN: Trey Sailors, PA HISTORY FROM: patient  REASON FOR VISIT: new consult    HISTORICAL  CHIEF COMPLAINT:  Chief Complaint  Patient presents with  . Neuropathy    rm 7 New Pt    HISTORY OF PRESENT ILLNESS:   44 year old female here for evaluation of neuropathy.  Patient has long history of diabetes, on insulin, with onset of numbness, tingling, pain in the feet going on for several years.  Patient has tried gabapentin without relief.  She is now with a pain management specialist and on Lyrica and tramadol.  She has tried topical medications without relief.  She has some swelling in her lower extremities.  She has burning and tingling sensation in the toes and feet.  She has some low back pain.   REVIEW OF SYSTEMS: Full 14 system review of systems performed and negative with exception of: As per HPI.  ALLERGIES: Allergies  Allergen Reactions  . Dust Mite Extract Anxiety, Hives, Itching, Rash, Shortness Of Breath and Swelling  . Molds & Smuts Anaphylaxis, Anxiety, Hives, Itching, Nausea Only, Rash, Shortness Of Breath and Swelling  . Prednisone Other (See Comments)    Other reaction(s): Other (See Comments) Hyperglycemia. Hyperglycemia. Other reaction(s): Other (See Comments) Hyperglycemia.   . Venlafaxine Other (See Comments) and Shortness Of Breath    Chest discomfort   . Latex Rash    Powder only   . Metformin Nausea And Vomiting, Nausea Only and Other (See Comments)  . Acyclovir Itching and Nausea Only  . Codeine Other (See Comments)    Unknown  . Famotidine   . Levaquin [Levofloxacin] Nausea And Vomiting    Po only can take iv  . Semaglutide Other (See Comments)    Made her really sick  . Trulicity [Dulaglutide] Nausea And Vomiting    HOME MEDICATIONS: Outpatient Medications Prior to Visit  Medication Sig Dispense Refill  . amitriptyline (ELAVIL) 25 MG  tablet Take 25-50 mg by mouth at bedtime.    Marland Kitchen buPROPion (WELLBUTRIN XL) 300 MG 24 hr tablet Take 300 mg by mouth at bedtime.    . celecoxib (CELEBREX) 200 MG capsule Take 200 mg by mouth as needed for mild pain.     . hydrOXYzine (VISTARIL) 25 MG capsule Take 25 mg by mouth at bedtime.    Marland Kitchen ibuprofen (ADVIL) 800 MG tablet Take 800 mg by mouth 3 (three) times daily.    Marland Kitchen LORazepam (ATIVAN) 0.5 MG tablet Take 1 tablet (0.5 mg total) by mouth 3 (three) times daily as needed for anxiety. 6 tablet 0  . methocarbamol (ROBAXIN) 500 MG tablet Take 1 tablet (500 mg total) by mouth 2 (two) times daily. 16 tablet 0  . naproxen (NAPROSYN) 500 MG tablet Take 500 mg by mouth as needed for mild pain.    Marland Kitchen NOVOLOG 100 UNIT/ML injection Inject into the skin.    Marland Kitchen ondansetron (ZOFRAN ODT) 8 MG disintegrating tablet Take 1 tablet (8 mg total) by mouth every 8 (eight) hours as needed for nausea or vomiting. 10 tablet 0  . pregabalin (LYRICA) 200 MG capsule Take 200 mg by mouth 2 (two) times daily.    . QUEtiapine (SEROQUEL) 50 MG tablet Take 50 mg by mouth at bedtime.    Marland Kitchen tiZANidine (ZANAFLEX) 4 MG tablet Take 4 mg by mouth as needed for muscle spasms.     . traMADol (ULTRAM) 50 MG tablet Take 50 mg by mouth  2 (two) times daily as needed.     No facility-administered medications prior to visit.    PAST MEDICAL HISTORY: Past Medical History:  Diagnosis Date  . Anxiety   . 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 CVA (cerebral vascular accident) ([place])    hx of  . Depression with anxiety   . Diabetes mellitus without complication ([place])    type 2  . Female hirsutism   . Fibromyalgia   . GERD without esophagitis   . Hyperlipidemia   . Hypertension   . IBS (irritable bowel syndrome)   . Long term (current) use of insulin ([place])   . Migraine   . Myopia of both eyes   . Neuropathy    feet  . Neuropathy   . OSA (obstructive sleep apnea)   . Sciatica   . Shingles     PAST SURGICAL HISTORY: Past  Surgical History:  Procedure Laterality Date  . CESAREAN SECTION     05/2001, 07/2012  . CHOLECYSTECTOMY  10/1999  . FACIAL COSMETIC SURGERY    . FOOT SURGERY      FAMILY HISTORY: No family history on file.  SOCIAL HISTORY: Social History   Socioeconomic History  . Marital status: Soil scientist    Spouse name: Not on file  . Number of children: 3  . Years of education: Not on file  . Highest education level: Not on file  Occupational History    Comment: home maker  Tobacco Use  . Smoking status: Current Every Day Smoker    Types: Cigarettes  . Smokeless tobacco: Never Used  . Tobacco comment: 5 or less a day  Vaping Use  . Vaping Use: Never used  Substance and Sexual Activity  . Alcohol use: Yes    Comment: occ  . Drug use: No  . Sexual activity: Not on file  Other Topics Concern  . Not on file  Social History Narrative   Lives with partner   Social Determinants of Health   Financial Resource Strain:   . Difficulty of Paying Living Expenses: Not on file  Food Insecurity:   . Worried About Charity fundraiser in the Last Year: Not on file  . Ran Out of Food in the Last Year: Not on file  Transportation Needs:   . Lack of Transportation (Medical): Not on file  . Lack of Transportation (Non-Medical): Not on file  Physical Activity:   . Days of Exercise per Week: Not on file  . Minutes of Exercise per Session: Not on file  Stress:   . Feeling of Stress : Not on file  Social Connections:   . Frequency of Communication with Friends and Family: Not on file  . Frequency of Social Gatherings with Friends and Family: Not on file  . Attends Religious Services: Not on file  . Active Member of Clubs or Organizations: Not on file  . Attends Archivist Meetings: Not on file  . Marital Status: Not on file  Intimate Partner Violence:   . Fear of Current or Ex-Partner: Not on file  . Emotionally Abused: Not on file  . Physically Abused: Not on file  .  Sexually Abused: Not on file     PHYSICAL EXAM  GENERAL EXAM/CONSTITUTIONAL: Vitals:  Vitals:   10/05/20 1011  BP: 108/71  Pulse: 96  Weight: 158 lb 12.8 oz (72 kg)  Height: 5' (1.524 m)     Body mass index is 31.01 kg/m. Wt  Readings from Last 3 Encounters:  10/05/20 158 lb 12.8 oz (72 kg)  02/15/20 139 lb 15.9 oz (63.5 kg)  07/22/19 158 lb (71.7 kg)     Patient is in no distress; well developed, nourished and groomed; neck is supple  CARDIOVASCULAR:  Examination of carotid arteries is normal; no carotid bruits  Regular rate and rhythm, no murmurs  Examination of peripheral vascular system by observation and palpation is normal  EYES:  Ophthalmoscopic exam of optic discs and posterior segments is normal; no papilledema or hemorrhages  No exam data present  MUSCULOSKELETAL:  Gait, strength, tone, movements noted in Neurologic exam below  NEUROLOGIC: MENTAL STATUS:  No flowsheet data found.  awake, alert, oriented to person, place and time  recent and remote memory intact  normal attention and concentration  language fluent, comprehension intact, naming intact  fund of knowledge appropriate  CRANIAL NERVE:   2nd - no papilledema on fundoscopic exam  2nd, 3rd, 4th, 6th - pupils equal and reactive to light, visual fields full to confrontation, extraocular muscles intact, no nystagmus  5th - facial sensation symmetric  7th - facial strength symmetric  8th - hearing intact  9th - palate elevates symmetrically, uvula midline  11th - shoulder shrug symmetric  12th - tongue protrusion midline  MOTOR:   normal bulk and tone, full strength in the BUE, BLE  SENSORY:   normal and symmetric to light touch, temperature, vibration  COORDINATION:   finger-nose-finger, fine finger movements normal  REFLEXES:   deep tendon reflexes TRACE and symmetric  GAIT/STATION:   narrow based gait     DIAGNOSTIC DATA (LABS, IMAGING, TESTING) - I  reviewed patient records, labs, notes, testing and imaging myself where available.  Lab Results  Component Value Date   WBC 13.9 (H) 02/15/2020   HGB 14.1 02/15/2020   HCT 42.2 02/15/2020   MCV 91.5 02/15/2020   PLT 408 (H) 02/15/2020      Component Value Date/Time   NA 134 (L) 02/15/2020 1752   K 4.9 02/15/2020 1752   CL 97 (L) 02/15/2020 1752   CO2 27 02/15/2020 1752   GLUCOSE 355 (H) 02/15/2020 1752   BUN 13 02/15/2020 1752   CREATININE 0.53 02/15/2020 1752   CALCIUM 8.5 (L) 02/15/2020 1752   PROT 6.2 (L) 02/15/2020 1752   ALBUMIN 3.4 (L) 02/15/2020 1752   AST 233 (H) 02/15/2020 1752   ALT 78 (H) 02/15/2020 1752   ALKPHOS 71 02/15/2020 1752   BILITOT 1.3 (H) 02/15/2020 1752   GFRNONAA >60 02/15/2020 1752   GFRAA >60 02/15/2020 1752   No results found for: CHOL, HDL, LDLCALC, LDLDIRECT, TRIG, CHOLHDL No results found for: HGBA1C No results found for: VITAMINB12 No results found for: TSH   02/11/20 A1c - 11.9    ASSESSMENT AND PLAN  44 y.o. year old female here with:  Dx:  1. Diabetic polyneuropathy associated with type 2 diabetes mellitus ([place])     PLAN:  DIABETIC NEUROPATHY - continue diabetes control - continue lyrica, tramadol (per pain mgmt clinic) - consider b-complex daily + alpha-lipoic acid 600mg  twice a day  - consider topical lidocaine and capsaicin cream  Return for return to PCP. follow up with PCP and pain mgmt clinic    Penni Bombard, MD 26/71/2458, 09:98 AM Certified in Neurology, Neurophysiology and [facility] [address], [place], [place] [address] [phone]

## 2020-12-22 DIAGNOSIS — E1165 Type 2 diabetes mellitus with hyperglycemia: Secondary | ICD-10-CM | POA: Diagnosis not present

## 2020-12-22 DIAGNOSIS — R059 Cough, unspecified: Secondary | ICD-10-CM | POA: Diagnosis not present

## 2021-02-15 ENCOUNTER — Other Ambulatory Visit: Payer: Self-pay

## 2021-02-15 ENCOUNTER — Emergency Department (INDEPENDENT_AMBULATORY_CARE_PROVIDER_SITE_OTHER)
Admission: EM | Admit: 2021-02-15 | Discharge: 2021-02-15 | Disposition: A | Discharge status: Home / Self Care | Payer: Medicare Other | Source: Home / Self Care

## 2021-02-15 DIAGNOSIS — F419 Anxiety disorder, unspecified: Secondary | ICD-10-CM | POA: Diagnosis not present

## 2021-02-15 DIAGNOSIS — R079 Chest pain, unspecified: Secondary | ICD-10-CM

## 2021-02-15 DIAGNOSIS — R0789 Other chest pain: Secondary | ICD-10-CM | POA: Diagnosis not present

## 2021-02-15 NOTE — Discharge Instructions (Addendum)
I recommend that you go to the emergency room.  I recommend that you allow [place] to call an ambulance.  If you choose to drive yourself to the emergency room I am worried that you may become dizzy or have an accident. You choose to drive to the emergency room you are doing this against my medical advice

## 2021-02-15 NOTE — ED Notes (Signed)
Patient is being discharged from the Urgent Care and sent to the Emergency Department via POV after declining the recommended EMS transport . Per Dr. Meda Coffee, patient is in need of higher level of care due to 8/10 chest pain with cardiac hx and abnormal EKG. Patient is aware and verbalizes understanding of plan of care.  Vitals:   02/15/21 1615  BP: 124/83  Pulse: 93  Resp: 16  Temp: 98.7 F (37.1 C)  SpO2: 100%

## 2021-02-15 NOTE — ED Provider Notes (Signed)
[facility]    CSN: [id number] Arrival date & time: 02/15/21  1600      History   Chief Complaint Chief Complaint  Patient presents with  . Anxiety    HPI Phyllis Pittman is a 45 y.o. female.   HPI Patient was sent to her work nurse today because of a complaint of chest pain and SOB.  Patient states the nurse told her to come here for an EKG. She is an insulin dep diabetic with no lipid control, positive smoker.  No Hx heart disease. Was seen in the emergency room March of last year for similar complaints.  She she had an EKG, blood work, monitoring, DVT evaluation, troponins, D-dimers, ultrasounds.  She was determined not to have cardiac pain and was sent home on Ativan for anxiety Patient states that she has a psychiatrist, psychiatric counselor, and a primary care doctor.  She admits that she is anxious.  When she is anxious she gets chest pain or shortness of breath.  Today she states the pain is worse than usual.  Right chest.  Radiating down right arm to the elbow.  Worse with activity.  Worse with deep breath.  She has not had any injury or overwork. She states that she did have the chest pain yesterday.  She took an 800 ibuprofen.  This did not help She denies any GI symptoms Denies any recent cough or infection Past Medical History:  Diagnosis Date  . Anxiety   . 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 CVA (cerebral vascular accident) ([place])    hx of  . Depression with anxiety   . Diabetes mellitus without complication ([place])    type 2  . Female hirsutism   . Fibromyalgia   . GERD without esophagitis   . Hyperlipidemia   . Hypertension   . IBS (irritable bowel syndrome)   . Long term (current) use of insulin ([place])   . Migraine   . Myopia of both eyes   . Neuropathy    feet  . Neuropathy   . OSA (obstructive sleep apnea)   . Sciatica   . Shingles     There are no problems to display for this patient.   Past Surgical History:  Procedure  Laterality Date  . CESAREAN SECTION     05/2001, 07/2012  . CHOLECYSTECTOMY  10/1999  . FACIAL COSMETIC SURGERY    . FOOT SURGERY      OB History   No obstetric history on file.      Home Medications    Prior to Admission medications   Medication Sig Start Date End Date Taking? Authorizing Provider  amitriptyline (ELAVIL) 25 MG tablet Take 25-50 mg by mouth at bedtime. 01/21/20   [provider]  buPROPion (WELLBUTRIN XL) 300 MG 24 hr tablet Take 300 mg by mouth at bedtime. 08/05/20   [provider]  celecoxib (CELEBREX) 200 MG capsule Take 200 mg by mouth as needed for mild pain.  10/22/19   [provider]  hydrOXYzine (VISTARIL) 25 MG capsule Take 25 mg by mouth at bedtime. 02/02/20   [provider]  ibuprofen (ADVIL) 800 MG tablet Take 800 mg by mouth 3 (three) times daily. 10/02/20   [provider]  LORazepam (ATIVAN) 0.5 MG tablet Take 1 tablet (0.5 mg total) by mouth 3 (three) times daily as needed for anxiety. 02/16/20   Tegeler, Gwenyth Allegra, MD  methocarbamol (ROBAXIN) 500 MG tablet Take 1 tablet (  500 mg total) by mouth 2 (two) times daily. 07/22/19   Volanda Napoleon, PA-C  naproxen (NAPROSYN) 500 MG tablet Take 500 mg by mouth as needed for mild pain.    [provider]  NOVOLOG 100 UNIT/ML injection Inject into the skin. 05/09/20   [provider]  ondansetron (ZOFRAN ODT) 8 MG disintegrating tablet Take 1 tablet (8 mg total) by mouth every 8 (eight) hours as needed for nausea or vomiting. 02/22/18   Kirichenko, Tatyana, PA-C  pregabalin (LYRICA) 200 MG capsule Take 200 mg by mouth 2 (two) times daily. 08/31/20   [provider]  QUEtiapine (SEROQUEL) 50 MG tablet Take 50 mg by mouth at bedtime. 05/09/20   [provider]  tiZANidine (ZANAFLEX) 4 MG tablet Take 4 mg by mouth as needed for muscle spasms.     [provider]  traMADol (ULTRAM) 50 MG tablet Take 50 mg by mouth 2 (two) times daily  as needed. 10/02/20   [provider]    Family History Family History  Problem Relation Age of Onset  . COPD Mother   . Diabetes Mother   . Cancer Mother   . Diabetes Father   . Cancer Father     Social History Social History   Tobacco Use  . Smoking status: Current Every Day Smoker    Packs/day: 0.25    Types: Cigarettes  . Smokeless tobacco: Never Used  . Tobacco comment: 5 or less a day  Vaping Use  . Vaping Use: Never used  Substance Use Topics  . Alcohol use: Yes    Comment: occ  . Drug use: No     Allergies   Dust mite extract, Molds & smuts, Prednisone, Venlafaxine, Latex, Metformin, Acyclovir, Codeine, Famotidine, Levaquin [levofloxacin], Semaglutide, and Trulicity [dulaglutide]   Review of Systems Review of Systems See HPI  Physical Exam Triage Vital Signs ED Triage Vitals  Enc Vitals Group     BP 02/15/21 1615 124/83     Pulse Rate 02/15/21 1615 93     Resp 02/15/21 1615 16     Temp 02/15/21 1615 98.7 F (37.1 C)     Temp Source 02/15/21 1615 Oral     SpO2 02/15/21 1615 100 %     Weight --      Height --      Head Circumference --      Peak Flow --      Pain Score 02/15/21 1613 8     Pain Loc --      Pain Edu? --      Excl. in [place]? --    No data found.  Updated Vital Signs BP 124/83 (BP Location: Right Arm)   Pulse 93   Temp 98.7 F (37.1 C) (Oral)   Resp 16   SpO2 100%  :     Physical Exam Constitutional:      General: She is not in acute distress.    Appearance: She is well-developed and well-nourished. She is obese. She is not ill-appearing.  HENT:     Head: Normocephalic and atraumatic.     Mouth/Throat:     Mouth: Oropharynx is clear and moist.     Comments: mask Eyes:     Conjunctiva/sclera: Conjunctivae normal.     Pupils: Pupils are equal, round, and reactive to light.  Cardiovascular:     Rate and Rhythm: Normal rate and regular rhythm.  Pulmonary:     Effort: Pulmonary effort is normal. No respiratory  distress.     Breath sounds: Normal breath sounds.  Chest:     Chest wall: Tenderness present.  Abdominal:     General: There is no distension.     Palpations: Abdomen is soft.  Musculoskeletal:        General: No edema. Normal range of motion.     Cervical back: Normal range of motion and neck supple.  Skin:    General: Skin is warm and dry.  Neurological:     Mental Status: She is alert.  Psychiatric:        Behavior: Behavior normal.      UC Treatments / Results  Labs (all labs ordered are listed, but only abnormal results are displayed) Labs Reviewed - No data to display  EKG Compared to prior.  Low voltage.  No ST or T wave changes to indicate STEMI  Radiology No results found.  Procedures Procedures (including critical care time)  Medications Ordered in UC Medications - No data to display  Initial Impression / Assessment and Plan / UC Course  I have reviewed the triage vital signs and the nursing notes.  Pertinent labs & imaging results that were available during my care of the patient were reviewed by me and considered in my medical decision making (see chart for details).     I had a discussion with the patient regarding the limitations of working of chest pain at the urgent care center.  I told her that it is reassuring that she has chest wall tenderness.  It is not definite that there is not other chest pathology going on.  I recommend she allow [place] to call someone to take her to the emergency room or EMS.  She absolutely refuses.  She states "I will go anywhere without a car".  I tried to reassure her that her car will be safe here. She chose to leave against my advice and drive herself to the emergency room Final Clinical Impressions(s) / UC Diagnoses   Final diagnoses:  Chest pain, unspecified type  Anxiety     Discharge Instructions     I recommend that you go to the emergency room.  I recommend that you allow [place] to call an ambulance.  If you  choose to drive yourself to the emergency room I am worried that you may become dizzy or have an accident. You choose to drive to the emergency room you are doing this against my medical advice   ED Prescriptions    None     PDMP not reviewed this encounter.   Raylene Everts, MD 02/15/21 [phone]

## 2021-02-15 NOTE — ED Triage Notes (Signed)
Patient presents to Urgent Care with complaints of "anxiety-induced chest pain", right sided since 3 days ago. Patient reports she occasionally gets short of breath with the dry cough she has been having. The nurse at the pt's job recommended she have an EKG done today and sent her here. Pt has hx of anxiety-related chest pain that she has had evaluated in the ED.

## 2021-06-30 DIAGNOSIS — Z794 Long term (current) use of insulin: Secondary | ICD-10-CM | POA: Diagnosis not present

## 2021-06-30 DIAGNOSIS — E663 Overweight: Secondary | ICD-10-CM | POA: Diagnosis not present

## 2021-06-30 DIAGNOSIS — E1165 Type 2 diabetes mellitus with hyperglycemia: Secondary | ICD-10-CM | POA: Diagnosis not present

## 2021-06-30 DIAGNOSIS — E1142 Type 2 diabetes mellitus with diabetic polyneuropathy: Secondary | ICD-10-CM | POA: Diagnosis not present

## 2021-06-30 DIAGNOSIS — Z20822 Contact with and (suspected) exposure to covid-19: Secondary | ICD-10-CM | POA: Diagnosis not present

## 2021-07-13 ENCOUNTER — Emergency Department (HOSPITAL_BASED_OUTPATIENT_CLINIC_OR_DEPARTMENT_OTHER)
Admission: EM | Admit: 2021-07-13 | Discharge: 2021-07-13 | Disposition: A | Discharge status: Home / Self Care | Payer: No Typology Code available for payment source | Attending: Emergency Medicine | Admitting: Emergency Medicine

## 2021-07-13 ENCOUNTER — Emergency Department (HOSPITAL_BASED_OUTPATIENT_CLINIC_OR_DEPARTMENT_OTHER): Payer: No Typology Code available for payment source

## 2021-07-13 ENCOUNTER — Other Ambulatory Visit: Payer: Self-pay

## 2021-07-13 ENCOUNTER — Encounter (HOSPITAL_BASED_OUTPATIENT_CLINIC_OR_DEPARTMENT_OTHER): Payer: Self-pay | Admitting: *Deleted

## 2021-07-13 DIAGNOSIS — F17210 Nicotine dependence, cigarettes, uncomplicated: Secondary | ICD-10-CM | POA: Insufficient documentation

## 2021-07-13 DIAGNOSIS — Z79899 Other long term (current) drug therapy: Secondary | ICD-10-CM | POA: Diagnosis not present

## 2021-07-13 DIAGNOSIS — S3991XA Unspecified injury of abdomen, initial encounter: Secondary | ICD-10-CM | POA: Diagnosis not present

## 2021-07-13 DIAGNOSIS — M542 Cervicalgia: Secondary | ICD-10-CM | POA: Diagnosis not present

## 2021-07-13 DIAGNOSIS — Z91040 Latex allergy status: Secondary | ICD-10-CM | POA: Diagnosis not present

## 2021-07-13 DIAGNOSIS — R1030 Lower abdominal pain, unspecified: Secondary | ICD-10-CM | POA: Insufficient documentation

## 2021-07-13 DIAGNOSIS — R0789 Other chest pain: Secondary | ICD-10-CM | POA: Insufficient documentation

## 2021-07-13 DIAGNOSIS — R519 Headache, unspecified: Secondary | ICD-10-CM | POA: Diagnosis not present

## 2021-07-13 DIAGNOSIS — Z041 Encounter for examination and observation following transport accident: Secondary | ICD-10-CM | POA: Diagnosis not present

## 2021-07-13 DIAGNOSIS — M545 Low back pain, unspecified: Secondary | ICD-10-CM | POA: Diagnosis not present

## 2021-07-13 DIAGNOSIS — E1165 Type 2 diabetes mellitus with hyperglycemia: Secondary | ICD-10-CM | POA: Diagnosis not present

## 2021-07-13 DIAGNOSIS — Y92410 Unspecified street and highway as the place of occurrence of the external cause: Secondary | ICD-10-CM | POA: Diagnosis not present

## 2021-07-13 DIAGNOSIS — R739 Hyperglycemia, unspecified: Secondary | ICD-10-CM

## 2021-07-13 DIAGNOSIS — Z794 Long term (current) use of insulin: Secondary | ICD-10-CM | POA: Insufficient documentation

## 2021-07-13 DIAGNOSIS — I10 Essential (primary) hypertension: Secondary | ICD-10-CM | POA: Insufficient documentation

## 2021-07-13 DIAGNOSIS — Z8541 Personal history of malignant neoplasm of cervix uteri: Secondary | ICD-10-CM | POA: Diagnosis not present

## 2021-07-13 DIAGNOSIS — R42 Dizziness and giddiness: Secondary | ICD-10-CM | POA: Diagnosis not present

## 2021-07-13 DIAGNOSIS — E119 Type 2 diabetes mellitus without complications: Secondary | ICD-10-CM | POA: Insufficient documentation

## 2021-07-13 LAB — COMPREHENSIVE METABOLIC PANEL
ALT: 22 U/L (ref 0–44)
AST: 26 U/L (ref 15–41)
Albumin: 4 g/dL (ref 3.5–5.0)
Alkaline Phosphatase: 62 U/L (ref 38–126)
Anion gap: 11 (ref 5–15)
BUN: 8 mg/dL (ref 6–20)
CO2: 23 mmol/L (ref 22–32)
Calcium: 8.6 mg/dL — ABNORMAL LOW (ref 8.9–10.3)
Chloride: 96 mmol/L — ABNORMAL LOW (ref 98–111)
Creatinine, Ser: 0.69 mg/dL (ref 0.44–1.00)
GFR, Estimated: >60 mL/min (ref >60)
Glucose, Bld: 569 mg/dL (ref 70–99)
Potassium: 3.7 mmol/L (ref 3.5–5.1)
Sodium: 130 mmol/L — ABNORMAL LOW (ref 135–145)
Total Bilirubin: 0.3 mg/dL (ref 0.3–1.2)
Total Protein: 7.3 g/dL (ref 6.5–8.1)

## 2021-07-13 LAB — CBC WITH DIFFERENTIAL/PLATELET
Abs Immature Granulocytes: 0.02 10*3/uL (ref 0.00–0.07)
Basophils Absolute: 0.1 10*3/uL (ref 0.0–0.1)
Basophils Relative: 1 %
Eosinophils Absolute: 0.1 10*3/uL (ref 0.0–0.5)
Eosinophils Relative: 1 %
HCT: 41.6 % (ref 36.0–46.0)
Hemoglobin: 14.4 g/dL (ref 12.0–15.0)
Immature Granulocytes: 0 %
Lymphocytes Relative: 35 %
Lymphs Abs: 2.7 10*3/uL (ref 0.7–4.0)
MCH: 30.5 pg (ref 26.0–34.0)
MCHC: 34.6 g/dL (ref 30.0–36.0)
MCV: 88.1 fL (ref 80.0–100.0)
Monocytes Absolute: 0.7 10*3/uL (ref 0.1–1.0)
Monocytes Relative: 9 %
Neutro Abs: 4.1 10*3/uL (ref 1.7–7.7)
Neutrophils Relative %: 54 %
Platelets: 366 10*3/uL (ref 150–400)
RBC: 4.72 MIL/uL (ref 3.87–5.11)
RDW: 11.8 % (ref 11.5–15.5)
WBC: 7.7 10*3/uL (ref 4.0–10.5)
nRBC: 0 % (ref 0.0–0.2)

## 2021-07-13 LAB — PREGNANCY, URINE: Preg Test, Ur: NEGATIVE

## 2021-07-13 LAB — URINALYSIS, ROUTINE W REFLEX MICROSCOPIC
Bilirubin Urine: NEGATIVE
Glucose, UA: >=500 mg/dL — AB
Hgb urine dipstick: NEGATIVE
Ketones, ur: NEGATIVE mg/dL
Leukocytes,Ua: NEGATIVE
Nitrite: POSITIVE — AB
Protein, ur: NEGATIVE mg/dL
Specific Gravity, Urine: 1.01 (ref 1.005–1.030)
pH: 5.5 (ref 5.0–8.0)

## 2021-07-13 LAB — URINALYSIS, MICROSCOPIC (REFLEX)

## 2021-07-13 LAB — CBG MONITORING, ED: Glucose-Capillary: 300 mg/dL — ABNORMAL HIGH (ref 70–99)

## 2021-07-13 MED ORDER — ONDANSETRON HCL 4 MG/2ML IJ SOLN
4.0000 mg | Freq: Once | INTRAMUSCULAR | Status: AC
  Administered 2021-07-13: 4 mg via INTRAVENOUS
  Filled 2021-07-13: qty 2

## 2021-07-13 MED ORDER — CYCLOBENZAPRINE HCL 5 MG PO TABS: 5.0000 mg | ORAL_TABLET | Freq: Three times a day (TID) | ORAL | 0 refills | Status: DC | PRN

## 2021-07-13 MED ORDER — IOHEXOL 300 MG/ML  SOLN
100.0000 mL | Freq: Once | INTRAMUSCULAR | Status: AC | PRN
  Administered 2021-07-13: 100 mL via INTRAVENOUS

## 2021-07-13 MED ORDER — SODIUM CHLORIDE 0.9 % IV BOLUS
1000.0000 mL | Freq: Once | INTRAVENOUS | Status: AC
  Administered 2021-07-13: 1000 mL via INTRAVENOUS

## 2021-07-13 MED ORDER — INSULIN ASPART 100 UNIT/ML IJ SOLN
8.0000 [IU] | Freq: Once | INTRAMUSCULAR | Status: AC
  Administered 2021-07-13: 8 [IU] via SUBCUTANEOUS

## 2021-07-13 MED ORDER — MORPHINE SULFATE (PF) 4 MG/ML IV SOLN
4.0000 mg | Freq: Once | INTRAVENOUS | Status: AC
  Administered 2021-07-13: 4 mg via INTRAVENOUS
  Filled 2021-07-13: qty 1

## 2021-07-13 MED ORDER — ALPRAZOLAM 0.5 MG PO TABS: 0.5000 mg | ORAL_TABLET | Freq: Every evening | ORAL | 0 refills | Status: DC | PRN

## 2021-07-13 NOTE — Discharge Instructions (Addendum)
Please continue taking your tramadol and gabapentin as prescribed  You can take Xanax for anxiety  You can also take Flexeril for muscle spasms.  Please continue taking your insulin as prescribed and check your blood sugar regularly   See your doctor for follow-up  Please call your pain management doctor  Return to ER if you have worse chest pain abdominal pain or dizziness or blood sugar greater than 500.
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
## 2021-07-13 NOTE — ED Notes (Signed)
Patient transported to CT 

## 2021-08-11 ENCOUNTER — Encounter (HOSPITAL_BASED_OUTPATIENT_CLINIC_OR_DEPARTMENT_OTHER): Payer: Self-pay | Admitting: Emergency Medicine

## 2021-08-11 ENCOUNTER — Other Ambulatory Visit: Payer: Self-pay

## 2021-08-11 ENCOUNTER — Inpatient Hospital Stay (HOSPITAL_BASED_OUTPATIENT_CLINIC_OR_DEPARTMENT_OTHER)
Admission: EM | Admit: 2021-08-11 | Discharge: 2021-08-14 | DRG: 872 | Disposition: A | Discharge status: Home / Self Care | Payer: Medicare Other | Attending: Internal Medicine | Admitting: Internal Medicine

## 2021-08-11 ENCOUNTER — Emergency Department (HOSPITAL_BASED_OUTPATIENT_CLINIC_OR_DEPARTMENT_OTHER): Payer: Medicare Other

## 2021-08-11 DIAGNOSIS — R519 Headache, unspecified: Secondary | ICD-10-CM

## 2021-08-11 DIAGNOSIS — Z8673 Personal history of transient ischemic attack (TIA), and cerebral infarction without residual deficits: Secondary | ICD-10-CM

## 2021-08-11 DIAGNOSIS — A419 Sepsis, unspecified organism: Principal | ICD-10-CM

## 2021-08-11 DIAGNOSIS — E1165 Type 2 diabetes mellitus with hyperglycemia: Secondary | ICD-10-CM | POA: Diagnosis not present

## 2021-08-11 DIAGNOSIS — N12 Tubulo-interstitial nephritis, not specified as acute or chronic: Secondary | ICD-10-CM | POA: Diagnosis present

## 2021-08-11 DIAGNOSIS — Z825 Family history of asthma and other chronic lower respiratory diseases: Secondary | ICD-10-CM

## 2021-08-11 DIAGNOSIS — F419 Anxiety disorder, unspecified: Secondary | ICD-10-CM

## 2021-08-11 DIAGNOSIS — R739 Hyperglycemia, unspecified: Secondary | ICD-10-CM

## 2021-08-11 DIAGNOSIS — Z833 Family history of diabetes mellitus: Secondary | ICD-10-CM

## 2021-08-11 DIAGNOSIS — Z8541 Personal history of malignant neoplasm of cervix uteri: Secondary | ICD-10-CM

## 2021-08-11 DIAGNOSIS — E872 Acidosis, unspecified: Secondary | ICD-10-CM

## 2021-08-11 DIAGNOSIS — Z79899 Other long term (current) drug therapy: Secondary | ICD-10-CM

## 2021-08-11 DIAGNOSIS — K589 Irritable bowel syndrome without diarrhea: Secondary | ICD-10-CM | POA: Diagnosis present

## 2021-08-11 DIAGNOSIS — Z881 Allergy status to other antibiotic agents status: Secondary | ICD-10-CM

## 2021-08-11 DIAGNOSIS — G43909 Migraine, unspecified, not intractable, without status migrainosus: Secondary | ICD-10-CM | POA: Diagnosis present

## 2021-08-11 DIAGNOSIS — E1140 Type 2 diabetes mellitus with diabetic neuropathy, unspecified: Secondary | ICD-10-CM | POA: Diagnosis present

## 2021-08-11 DIAGNOSIS — Z885 Allergy status to narcotic agent status: Secondary | ICD-10-CM

## 2021-08-11 DIAGNOSIS — E119 Type 2 diabetes mellitus without complications: Secondary | ICD-10-CM

## 2021-08-11 DIAGNOSIS — Z20822 Contact with and (suspected) exposure to covid-19: Secondary | ICD-10-CM | POA: Diagnosis present

## 2021-08-11 DIAGNOSIS — G4733 Obstructive sleep apnea (adult) (pediatric): Secondary | ICD-10-CM | POA: Diagnosis present

## 2021-08-11 DIAGNOSIS — F17210 Nicotine dependence, cigarettes, uncomplicated: Secondary | ICD-10-CM | POA: Diagnosis present

## 2021-08-11 DIAGNOSIS — I10 Essential (primary) hypertension: Secondary | ICD-10-CM

## 2021-08-11 DIAGNOSIS — F32A Depression, unspecified: Secondary | ICD-10-CM | POA: Diagnosis present

## 2021-08-11 DIAGNOSIS — Z888 Allergy status to other drugs, medicaments and biological substances status: Secondary | ICD-10-CM

## 2021-08-11 DIAGNOSIS — R509 Fever, unspecified: Secondary | ICD-10-CM

## 2021-08-11 DIAGNOSIS — E871 Hypo-osmolality and hyponatremia: Secondary | ICD-10-CM

## 2021-08-11 DIAGNOSIS — E785 Hyperlipidemia, unspecified: Secondary | ICD-10-CM

## 2021-08-11 DIAGNOSIS — M797 Fibromyalgia: Secondary | ICD-10-CM | POA: Diagnosis present

## 2021-08-11 DIAGNOSIS — Z809 Family history of malignant neoplasm, unspecified: Secondary | ICD-10-CM

## 2021-08-11 DIAGNOSIS — E876 Hypokalemia: Secondary | ICD-10-CM

## 2021-08-11 DIAGNOSIS — K219 Gastro-esophageal reflux disease without esophagitis: Secondary | ICD-10-CM | POA: Diagnosis present

## 2021-08-11 LAB — CBC WITH DIFFERENTIAL/PLATELET
Abs Immature Granulocytes: 0.07 10*3/uL (ref 0.00–0.07)
Basophils Absolute: 0 10*3/uL (ref 0.0–0.1)
Basophils Relative: 0 %
Eosinophils Absolute: 0 10*3/uL (ref 0.0–0.5)
Eosinophils Relative: 0 %
HCT: 43.4 % (ref 36.0–46.0)
Hemoglobin: 15.2 g/dL — ABNORMAL HIGH (ref 12.0–15.0)
Immature Granulocytes: 1 %
Lymphocytes Relative: 8 %
Lymphs Abs: 1.3 10*3/uL (ref 0.7–4.0)
MCH: 30.3 pg (ref 26.0–34.0)
MCHC: 35 g/dL (ref 30.0–36.0)
MCV: 86.6 fL (ref 80.0–100.0)
Monocytes Absolute: 0.9 10*3/uL (ref 0.1–1.0)
Monocytes Relative: 6 %
Neutro Abs: 12.8 10*3/uL — ABNORMAL HIGH (ref 1.7–7.7)
Neutrophils Relative %: 85 %
Platelets: 364 10*3/uL (ref 150–400)
RBC: 5.01 MIL/uL (ref 3.87–5.11)
RDW: 12.1 % (ref 11.5–15.5)
WBC: 15.1 10*3/uL — ABNORMAL HIGH (ref 4.0–10.5)
nRBC: 0 % (ref 0.0–0.2)

## 2021-08-11 LAB — PROTIME-INR
INR: 1.1 (ref 0.8–1.2)
Prothrombin Time: 13.8 seconds (ref 11.4–15.2)

## 2021-08-11 LAB — COMPREHENSIVE METABOLIC PANEL
ALT: 21 U/L (ref 0–44)
AST: 27 U/L (ref 15–41)
Albumin: 4 g/dL (ref 3.5–5.0)
Alkaline Phosphatase: 103 U/L (ref 38–126)
Anion gap: 12 (ref 5–15)
BUN: 9 mg/dL (ref 6–20)
CO2: 26 mmol/L (ref 22–32)
Calcium: 9.2 mg/dL (ref 8.9–10.3)
Chloride: 95 mmol/L — ABNORMAL LOW (ref 98–111)
Creatinine, Ser: 0.78 mg/dL (ref 0.44–1.00)
GFR, Estimated: >60 mL/min (ref >60)
Glucose, Bld: 335 mg/dL — ABNORMAL HIGH (ref 70–99)
Potassium: 3.1 mmol/L — ABNORMAL LOW (ref 3.5–5.1)
Sodium: 133 mmol/L — ABNORMAL LOW (ref 135–145)
Total Bilirubin: 0.5 mg/dL (ref 0.3–1.2)
Total Protein: 8.5 g/dL — ABNORMAL HIGH (ref 6.5–8.1)

## 2021-08-11 LAB — LACTIC ACID, PLASMA: Lactic Acid, Venous: 2.2 mmol/L (ref 0.5–1.9)

## 2021-08-11 MED ORDER — SODIUM CHLORIDE 0.9 % IV SOLN
1000.0000 mL | INTRAVENOUS | Status: DC
  Administered 2021-08-12: 1000 mL via INTRAVENOUS

## 2021-08-11 MED ORDER — SODIUM CHLORIDE 0.9 % IV BOLUS (SEPSIS)
1000.0000 mL | Freq: Once | INTRAVENOUS | Status: AC
  Administered 2021-08-11: 1000 mL via INTRAVENOUS

## 2021-08-11 MED ORDER — ONDANSETRON HCL 4 MG/2ML IJ SOLN
4.0000 mg | Freq: Once | INTRAMUSCULAR | Status: AC
  Administered 2021-08-12: 4 mg via INTRAVENOUS
  Filled 2021-08-11: qty 2

## 2021-08-11 MED ORDER — ACETAMINOPHEN 500 MG PO TABS
1000.0000 mg | ORAL_TABLET | Freq: Once | ORAL | Status: AC
  Administered 2021-08-11: 1000 mg via ORAL
  Filled 2021-08-11: qty 2

## 2021-08-11 MED ORDER — ALUM & MAG HYDROXIDE-SIMETH 200-200-20 MG/5ML PO SUSP
30.0000 mL | Freq: Once | ORAL | Status: AC
  Administered 2021-08-12: 30 mL via ORAL
  Filled 2021-08-11: qty 30

## 2021-08-11 NOTE — ED Notes (Signed)
ED Provider at bedside. 

## 2021-08-11 NOTE — ED Provider Notes (Addendum)
[facility] DEPARTMENT Provider Note  CSN: [id number] Arrival date & time: 08/11/21 1952  Chief Complaint(s) Headache  HPI Phyllis Pittman is a 45 y.o. female    Headache Pain location:  Frontal, R temporal and L temporal Onset quality:  Gradual Duration:  1 week Timing:  Constant Progression:  Waxing and waning Chronicity:  New Relieved by: coca-cola. 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   CVA (cerebral vascular accident) ([place])    hx of   Depression with anxiety    Diabetes mellitus without complication ([place])    type 2   Female hirsutism    Fibromyalgia    GERD without esophagitis    Hyperlipidemia    Hypertension    IBS (irritable bowel syndrome)    Long term (current) use of insulin ([facility])    Migraine    Myopia of both eyes    Neuropathy    feet   Neuropathy    OSA (obstructive sleep apnea)    Sciatica    Shingles    Patient Active Problem List   Diagnosis Date Noted   Pyelonephritis 08/12/2021    Home Medication(s) Prior to Admission medications   Medication Sig Start Date End Date Taking? Authorizing Provider  ALPRAZolam Duanne Moron) 0.5 MG tablet Take 1 tablet (0.5 mg total) by mouth at bedtime as needed for anxiety. 07/13/21   Drenda Freeze, MD  amitriptyline (ELAVIL) 25 MG tablet Take 25-50 mg by mouth at bedtime. 01/21/20   [provider]  buPROPion (WELLBUTRIN XL) 300 MG 24 hr tablet Take 300 mg by mouth at bedtime. 08/05/20   [provider]  celecoxib (CELEBREX) 200 MG capsule Take 200 mg by mouth as needed for mild pain.  10/22/19   [provider]  cyclobenzaprine (FLEXERIL) 5 MG tablet Take 1 tablet (5 mg  total) by mouth 3 (three) times daily as needed for muscle spasms. 07/13/21   Drenda Freeze, MD  hydrOXYzine (VISTARIL) 25 MG capsule Take 25 mg by mouth at bedtime. 02/02/20   [provider]  ibuprofen (ADVIL) 800 MG tablet Take 800 mg by mouth 3 (three) times daily. 10/02/20   [provider]  LORazepam (ATIVAN) 0.5 MG tablet Take 1 tablet (0.5 mg total) by mouth 3 (three) times daily as needed for anxiety. 02/16/20   Tegeler, Gwenyth Allegra, MD  methocarbamol (ROBAXIN) 500 MG tablet Take 1 tablet (500 mg total) by mouth 2 (two) times daily. 07/22/19   Volanda Napoleon, PA-C  naproxen (NAPROSYN) 500 MG tablet Take 500 mg by mouth as needed for mild pain.    [provider]  NOVOLOG 100 UNIT/ML injection Inject into the skin. 05/09/20   [provider]  ondansetron (ZOFRAN ODT) 8 MG disintegrating tablet Take 1 tablet (8 mg total) by mouth every 8 (eight) hours as needed for nausea or vomiting. 02/22/18   Kirichenko, Tatyana, PA-C  pregabalin (LYRICA) 200 MG capsule Take 200 mg by mouth 2 (two) times daily. 08/31/20   [provider]  QUEtiapine (SEROQUEL) 50 MG tablet Take 50 mg by mouth at bedtime. 05/09/20   [provider]  tiZANidine (ZANAFLEX) 4 MG tablet Take 4 mg by mouth  as needed for muscle spasms.     [provider]  traMADol (ULTRAM) 50 MG tablet Take 50 mg by mouth 2 (two) times daily as needed. 10/02/20   [provider]                                                                                                                                    Past Surgical History Past Surgical History:  Procedure Laterality Date   CESAREAN SECTION     05/2001, 07/2012   CHOLECYSTECTOMY  10/1999   FACIAL COSMETIC SURGERY     FOOT SURGERY     Family History Family History  Problem Relation Age of Onset   COPD Mother    Diabetes Mother    Cancer Mother    Diabetes Father    Cancer Father     Social History Social  History   Tobacco Use   Smoking status: Every Day    Packs/day: 0.25    Types: Cigarettes   Smokeless tobacco: Never   Tobacco comments:    5 or less a day  Vaping Use   Vaping Use: Never used  Substance Use Topics   Alcohol use: Yes    Comment: occ   Drug use: No   Allergies Dust mite extract, Molds & smuts, Prednisone, Venlafaxine, Latex, Metformin, Acyclovir, Codeine, Famotidine, Levaquin [levofloxacin], Semaglutide, and Trulicity [dulaglutide]  Review of Systems Review of Systems  Constitutional:  Positive for fever.  HENT:  Positive for ear pain and sore throat.   Eyes:  Positive for photophobia.  Gastrointestinal:  Positive for nausea.  Musculoskeletal:  Positive for back pain, myalgias, neck pain and neck stiffness.  Neurological:  Positive for headaches.  All other systems are reviewed and are negative for acute change except as noted in the HPI  Physical Exam Vital Signs  I have reviewed the triage vital signs BP 123/79 (BP Location: Right Arm)   Pulse (!) 124   Temp (!) 101.6 F (38.7 C) (Oral)   Resp 18   Ht 5' (1.524 m)   Wt 64.4 kg   SpO2 99%   BMI 27.73 kg/m   Physical Exam Vitals reviewed.  Constitutional:      General: She is not in acute distress.    Appearance: She is well-developed. She is not diaphoretic.  HENT:     Head: Normocephalic and atraumatic.     Right Ear: Tympanic membrane and external ear normal.     Left Ear: Tympanic membrane and external ear normal.     Nose: Nose normal.     Mouth/Throat:     Mouth: No oral lesions.     Palate: No lesions.     Pharynx: No pharyngeal swelling.     Tonsils: No tonsillar exudate or tonsillar abscesses.     Comments: Post nasal drip  Eyes:     General: No scleral icterus.  Right eye: No discharge.        Left eye: No discharge.     Conjunctiva/sclera: Conjunctivae normal.     Pupils: Pupils are equal, round, and reactive to light.  Cardiovascular:     Rate and Rhythm: Normal rate  and regular rhythm.     Heart sounds: No murmur heard.   No friction rub. No gallop.  Pulmonary:     Effort: Pulmonary effort is normal. No respiratory distress.     Breath sounds: Normal breath sounds. No stridor. No rales.  Abdominal:     General: There is no distension.     Palpations: Abdomen is soft.     Tenderness: There is abdominal tenderness in the periumbilical area, suprapubic area and left lower quadrant.  Musculoskeletal:        General: No tenderness.     Cervical back: Normal range of motion and neck supple.     Comments: Scoliosis  Skin:    General: Skin is warm and dry.     Findings: No erythema or rash.  Neurological:     Mental Status: She is alert and oriented to person, place, and time.    ED Results and Treatments Labs (all labs ordered are listed, but only abnormal results are displayed) Labs Reviewed  COMPREHENSIVE METABOLIC PANEL - Abnormal; Notable for the following components:      Result Value   Sodium 133 (*)    Potassium 3.1 (*)    Chloride 95 (*)    Glucose, Bld 335 (*)    Total Protein 8.5 (*)    All other components within normal limits  LACTIC ACID, PLASMA - Abnormal; Notable for the following components:   Lactic Acid, Venous 2.2 (*)    All other components within normal limits  CBC WITH DIFFERENTIAL/PLATELET - Abnormal; Notable for the following components:   WBC 15.1 (*)    Hemoglobin 15.2 (*)    Neutro Abs 12.8 (*)    All other components within normal limits  URINALYSIS, ROUTINE W REFLEX MICROSCOPIC - Abnormal; Notable for the following components:   APPearance CLOUDY (*)    Glucose, UA >=500 (*)    Hgb urine dipstick LARGE (*)    Bilirubin Urine LARGE (*)    Ketones, ur 40 (*)    Protein, ur 30 (*)    Leukocytes,Ua SMALL (*)    All other components within normal limits  GLUCOSE, CSF - Abnormal; Notable for the following components:   Glucose, CSF 193 (*)    All other components within normal limits  PROTEIN, CSF - Abnormal;  Notable for the following components:   Total  Protein, CSF 68 (*)    All other components within normal limits  URINALYSIS, MICROSCOPIC (REFLEX) - Abnormal; Notable for the following components:   Bacteria, UA MANY (*)    All other components within normal limits  RESP PANEL BY RT-PCR (FLU A&B, COVID) ARPGX2  GRAM STAIN  CULTURE, BLOOD (ROUTINE X 2)  CULTURE, BLOOD (ROUTINE X 2)  CSF CULTURE W GRAM STAIN  HSV CULTURE AND TYPING  VZV PCR, CSF  LACTIC ACID, PLASMA  PROTIME-INR  PREGNANCY, URINE  LIPASE, BLOOD  CSF CELL COUNT WITH DIFFERENTIAL  CSF CELL COUNT WITH DIFFERENTIAL  HSV 1/2 PCR, CSF  EKG  EKG Interpretation  Date/Time:    Ventricular Rate:   117 PR Interval:    QRS Duration:  90 QT Interval: 448   QTC Calculation:626   R Axis:   71  Text Interpretation: Sinus tachycardia, prolonged QT interval       Radiology CT ABDOMEN PELVIS W CONTRAST  Result Date: 08/12/2021 CLINICAL DATA:  Abdominal pain, fever EXAM: CT ABDOMEN AND PELVIS WITH CONTRAST TECHNIQUE: Multidetector CT imaging of the abdomen and pelvis was performed using the standard protocol following bolus administration of intravenous contrast. CONTRAST:  145m OMNIPAQUE IOHEXOL 350 MG/ML SOLN COMPARISON:  07/13/2021 FINDINGS: Lower chest: Lung bases are clear. Hepatobiliary: Liver is within normal limits. Status post cholecystectomy. No intrahepatic or extrahepatic ductal dilatation. Pancreas: Within normal limits. Spleen: Within normal limits. Adrenals/Urinary Tract: Adrenal glands are within normal limits. Multiple punctate nonobstructing left renal calculi measuring up to 2 mm (for example, series 2/image 40). Heterogeneous enhancement of the bilateral kidneys, including the lateral right upper pole (series 2/image 33) and multiple areas within the left kidney (series 2/images 29, 36, 44, and  46), reflecting pyelonephritis. No hydronephrosis. Bladder is within normal limits. Stomach/Bowel: Stomach is within normal limits. No evidence of bowel obstruction. Appendix is not discretely visualized. Vascular/Lymphatic: No evidence of abdominal aortic aneurysm. No suspicious abdominopelvic lymphadenopathy. Reproductive: Uterus is within normal limits. Bilateral ovaries are within normal limits. Other: No abdominopelvic ascites. Musculoskeletal: Visualized osseous structures are within normal limits. IMPRESSION: Acute pyelonephritis, left greater than right. Multiple punctate nonobstructing left renal calculi measuring up to 2 mm. No hydronephrosis. Electronically Signed   By: SJulian HyM.D.   On: 08/12/2021 02:48   DG Chest Portable 1 View  Result Date: 08/11/2021 CLINICAL DATA:  Fever EXAM: PORTABLE CHEST 1 VIEW COMPARISON:  02/15/2020 FINDINGS: The heart size and mediastinal contours are within normal limits. Both lungs are clear. The visualized skeletal structures are unremarkable. IMPRESSION: No active disease. Electronically Signed   By: KDonavan FoilM.D.   On: 08/11/2021 23:49    Pertinent labs & imaging results that were available during my care of the patient were reviewed by me and considered in my medical decision making (see MDM for details).  Medications Ordered in ED Medications  sodium chloride 0.9 % bolus 1,000 mL (0 mLs Intravenous Stopped 08/12/21 0102)    Followed by  0.9 %  sodium chloride infusion (1,000 mLs Intravenous New Bag/Given 08/12/21 0103)  acetaminophen (TYLENOL) tablet 1,000 mg (1,000 mg Oral Given 08/11/21 2359)  ondansetron (ZOFRAN) injection 4 mg (4 mg Intravenous Given 08/12/21 0000)  alum & mag hydroxide-simeth (MAALOX/MYLANTA) 200-200-20 MG/5ML suspension 30 mL (30 mLs Oral Given 08/12/21 0000)  lidocaine-EPINEPHrine (XYLOCAINE W/EPI) 2 %-1:200000 (PF) injection 10 mL (10 mLs Other Given 08/12/21 0102)  ketorolac (TORADOL) 15 MG/ML injection 15 mg (15 mg  Intravenous Given 08/12/21 0149)  HYDROmorphone (DILAUDID) injection 0.5 mg (0.5 mg Intravenous Given 08/12/21 0151)  dexamethasone (DECADRON) injection 10 mg (10 mg Intravenous Given 08/12/21 0158)  cefTRIAXone (ROCEPHIN) 2 g in sodium chloride 0.9 % 100 mL IVPB (0 g Intravenous Stopped 08/12/21 0213)  pantoprazole (PROTONIX) injection 40 mg (40 mg Intravenous Given 08/12/21 0151)  iohexol (OMNIPAQUE) 350 MG/ML injection 100 mL (100 mLs Intravenous Contrast Given 08/12/21 0240)  Procedures .1-3 Lead EKG Interpretation  Date/Time: 08/12/2021 1:34 AM Performed by: Fatima Blank, MD Authorized by: Fatima Blank, MD     Interpretation: abnormal     ECG rate:  120   ECG rate assessment: tachycardic     Rhythm: sinus tachycardia     Ectopy: none     Conduction: normal   .Lumbar Puncture  Date/Time: 08/12/2021 1:34 AM Performed by: Fatima Blank, MD Authorized by: Fatima Blank, MD   Consent:    Consent obtained:  Written   Consent given by:  Patient   Risks discussed:  Bleeding, repeat procedure, headache and nerve damage   Alternatives discussed:  Delayed treatment Universal protocol:    Procedure explained and questions answered to patient or proxy's satisfaction: yes     Immediately prior to procedure a time out was called: yes     Site/side marked: yes     Patient identity confirmed:  Verbally with patient Pre-procedure details:    Procedure purpose:  Diagnostic   Preparation: Patient was prepped and draped in usual sterile fashion   Procedure details:    Lumbar space:  L4-L5 interspace   Patient position:  R lateral decubitus   Needle gauge:  18   Needle type:  Spinal needle - Quincke tip   Needle length (in):  3.5   Ultrasound guidance: no     Number of attempts:  1   Opening pressure (cm H2O):  16   Fluid  appearance:  Clear   Tubes of fluid:  4   Total volume (ml):  5 Post-procedure details:    Puncture site:  Adhesive bandage applied   Procedure completion:  Tolerated .Critical Care  Date/Time: 08/12/2021 8:11 AM Performed by: Fatima Blank, MD Authorized by: Fatima Blank, MD   Critical care provider statement:    Critical care time (minutes):  45   Critical care was necessary to treat or prevent imminent or life-threatening deterioration of the following conditions:  Sepsis   Critical care was time spent personally by me on the following activities:  Discussions with consultants, evaluation of patient's response to treatment, examination of patient, ordering and performing treatments and interventions, ordering and review of laboratory studies, ordering and review of radiographic studies, pulse oximetry, re-evaluation of patient's condition, obtaining history from patient or surrogate and review of old charts   Care discussed with: admitting provider    (including critical care time)  Medical Decision Making / ED Course I have reviewed the nursing notes for this encounter and the patient's prior records (if available in EHR or on provided paperwork).  Phyllis Pittman was evaluated in Emergency Department on 08/12/2021 for the symptoms described in the history of present illness. She was evaluated in the context of the global COVID-19 pandemic, which necessitated consideration that the patient might be at risk for infection with the SARS-CoV-2 virus that causes COVID-19. Institutional protocols and algorithms that pertain to the evaluation of patients at risk for COVID-19 are in a state of rapid change based on information released by regulatory bodies including the CDC and federal and state organizations. These policies and algorithms were followed during the patient's care in the ED.     Patient is febrile with headache and nuchal rigidity. Septic work-up initiated. LP  to rule out meningitis -clear CSF with normal opening pressure.  Bacterial meningitis unlikely but will await CSF studies.  Pertinent labs & imaging results that were available during my care of the patient  were reviewed by me and considered in my medical decision making:  Rest of the work-up notable for leukocytosis and elevated lactic acid. Given patient's abdominal discomfort, CT scan obtained and notable for pyelonephritis. UA still pending. Patient started on empiric antibiotics to cover for both meningitis and urinary source.  UA confirmed UTI. Admitted to medicine for further work-up and management.  Final Clinical Impression(s) / ED Diagnoses Final diagnoses:  Sepsis without acute organ dysfunction, due to unspecified organism ([place])  Fever in adult  Pyelonephritis  Nonintractable episodic headache, unspecified headache type     This chart was dictated using voice recognition software.  Despite best efforts to proofread,  errors can occur which can change the documentation meaning.      Fatima Blank, MD 08/12/21 [id number]

## 2021-08-11 NOTE — ED Triage Notes (Signed)
Pt states she has had headache with neck pain this week. Pt was seen by her PMD and told to come ED for fluids and antibiotics. Pt states her PMD wanted her to have a spinal tap.

## 2021-08-12 ENCOUNTER — Emergency Department (HOSPITAL_BASED_OUTPATIENT_CLINIC_OR_DEPARTMENT_OTHER): Payer: Medicare Other

## 2021-08-12 DIAGNOSIS — E872 Acidosis, unspecified: Secondary | ICD-10-CM

## 2021-08-12 DIAGNOSIS — I10 Essential (primary) hypertension: Secondary | ICD-10-CM

## 2021-08-12 DIAGNOSIS — F419 Anxiety disorder, unspecified: Secondary | ICD-10-CM | POA: Diagnosis present

## 2021-08-12 DIAGNOSIS — A419 Sepsis, unspecified organism: Secondary | ICD-10-CM | POA: Diagnosis present

## 2021-08-12 DIAGNOSIS — Z20822 Contact with and (suspected) exposure to covid-19: Secondary | ICD-10-CM | POA: Diagnosis present

## 2021-08-12 DIAGNOSIS — Z885 Allergy status to narcotic agent status: Secondary | ICD-10-CM | POA: Diagnosis not present

## 2021-08-12 DIAGNOSIS — R519 Headache, unspecified: Secondary | ICD-10-CM | POA: Diagnosis not present

## 2021-08-12 DIAGNOSIS — Z888 Allergy status to other drugs, medicaments and biological substances status: Secondary | ICD-10-CM | POA: Diagnosis not present

## 2021-08-12 DIAGNOSIS — Z881 Allergy status to other antibiotic agents status: Secondary | ICD-10-CM | POA: Diagnosis not present

## 2021-08-12 DIAGNOSIS — Z79899 Other long term (current) drug therapy: Secondary | ICD-10-CM | POA: Diagnosis not present

## 2021-08-12 DIAGNOSIS — E785 Hyperlipidemia, unspecified: Secondary | ICD-10-CM

## 2021-08-12 DIAGNOSIS — Z825 Family history of asthma and other chronic lower respiratory diseases: Secondary | ICD-10-CM | POA: Diagnosis not present

## 2021-08-12 DIAGNOSIS — N12 Tubulo-interstitial nephritis, not specified as acute or chronic: Secondary | ICD-10-CM | POA: Diagnosis present

## 2021-08-12 DIAGNOSIS — R739 Hyperglycemia, unspecified: Secondary | ICD-10-CM

## 2021-08-12 DIAGNOSIS — Z8541 Personal history of malignant neoplasm of cervix uteri: Secondary | ICD-10-CM | POA: Diagnosis not present

## 2021-08-12 DIAGNOSIS — E871 Hypo-osmolality and hyponatremia: Secondary | ICD-10-CM | POA: Diagnosis not present

## 2021-08-12 DIAGNOSIS — E119 Type 2 diabetes mellitus without complications: Secondary | ICD-10-CM

## 2021-08-12 DIAGNOSIS — R000 Tachycardia, unspecified: Secondary | ICD-10-CM | POA: Diagnosis not present

## 2021-08-12 DIAGNOSIS — E876 Hypokalemia: Secondary | ICD-10-CM

## 2021-08-12 DIAGNOSIS — Z833 Family history of diabetes mellitus: Secondary | ICD-10-CM | POA: Diagnosis not present

## 2021-08-12 DIAGNOSIS — F17210 Nicotine dependence, cigarettes, uncomplicated: Secondary | ICD-10-CM | POA: Diagnosis present

## 2021-08-12 DIAGNOSIS — K219 Gastro-esophageal reflux disease without esophagitis: Secondary | ICD-10-CM | POA: Diagnosis present

## 2021-08-12 DIAGNOSIS — Z809 Family history of malignant neoplasm, unspecified: Secondary | ICD-10-CM | POA: Diagnosis not present

## 2021-08-12 DIAGNOSIS — M797 Fibromyalgia: Secondary | ICD-10-CM | POA: Diagnosis present

## 2021-08-12 DIAGNOSIS — G4733 Obstructive sleep apnea (adult) (pediatric): Secondary | ICD-10-CM | POA: Diagnosis present

## 2021-08-12 DIAGNOSIS — E1165 Type 2 diabetes mellitus with hyperglycemia: Secondary | ICD-10-CM | POA: Diagnosis not present

## 2021-08-12 DIAGNOSIS — F32A Depression, unspecified: Secondary | ICD-10-CM | POA: Diagnosis present

## 2021-08-12 DIAGNOSIS — Z8673 Personal history of transient ischemic attack (TIA), and cerebral infarction without residual deficits: Secondary | ICD-10-CM | POA: Diagnosis not present

## 2021-08-12 DIAGNOSIS — E1140 Type 2 diabetes mellitus with diabetic neuropathy, unspecified: Secondary | ICD-10-CM | POA: Diagnosis present

## 2021-08-12 LAB — URINALYSIS, MICROSCOPIC (REFLEX): WBC, UA: >50 WBC/hpf (ref 0–5)

## 2021-08-12 LAB — GRAM STAIN: Gram Stain: NONE SEEN

## 2021-08-12 LAB — RESP PANEL BY RT-PCR (FLU A&B, COVID) ARPGX2
Influenza A by PCR: NEGATIVE
Influenza B by PCR: NEGATIVE
SARS Coronavirus 2 by RT PCR: NEGATIVE

## 2021-08-12 LAB — GLUCOSE, CAPILLARY
Glucose-Capillary: 500 mg/dL — ABNORMAL HIGH (ref 70–99)
Glucose-Capillary: 433 mg/dL — ABNORMAL HIGH (ref 70–99)
Glucose-Capillary: 397 mg/dL — ABNORMAL HIGH (ref 70–99)
Glucose-Capillary: 358 mg/dL — ABNORMAL HIGH (ref 70–99)
Glucose-Capillary: 297 mg/dL — ABNORMAL HIGH (ref 70–99)

## 2021-08-12 LAB — CSF CELL COUNT WITH DIFFERENTIAL
RBC Count, CSF: 0 /mm3
RBC Count, CSF: 0 /mm3
Tube #: 1
Tube #: 4
WBC, CSF: 1 /mm3 (ref 0–5)
WBC, CSF: 2 /mm3 (ref 0–5)

## 2021-08-12 LAB — URINALYSIS, ROUTINE W REFLEX MICROSCOPIC
Glucose, UA: >=500 mg/dL — AB
Ketones, ur: 40 mg/dL — AB
Nitrite: NEGATIVE
Protein, ur: 30 mg/dL — AB
Specific Gravity, Urine: 1.015 (ref 1.005–1.030)
pH: 6 (ref 5.0–8.0)

## 2021-08-12 LAB — LIPASE, BLOOD: Lipase: 23 U/L (ref 11–51)

## 2021-08-12 LAB — PROTEIN, CSF: Total  Protein, CSF: 68 mg/dL — ABNORMAL HIGH (ref 15–45)

## 2021-08-12 LAB — LACTIC ACID, PLASMA: Lactic Acid, Venous: 1.3 mmol/L (ref 0.5–1.9)

## 2021-08-12 LAB — GLUCOSE, CSF: Glucose, CSF: 193 mg/dL — ABNORMAL HIGH (ref 40–70)

## 2021-08-12 LAB — PREGNANCY, URINE: Preg Test, Ur: NEGATIVE

## 2021-08-12 MED ORDER — INSULIN ASPART 100 UNIT/ML IJ SOLN: 2.0000 [IU] | INTRAMUSCULAR | Status: DC | PRN

## 2021-08-12 MED ORDER — ENOXAPARIN SODIUM 40 MG/0.4ML IJ SOSY
40.0000 mg | PREFILLED_SYRINGE | INTRAMUSCULAR | Status: DC
  Administered 2021-08-12: 40 mg via SUBCUTANEOUS
  Filled 2021-08-12 (×2): qty 0.4

## 2021-08-12 MED ORDER — CYCLOBENZAPRINE HCL 5 MG PO TABS
5.0000 mg | ORAL_TABLET | Freq: Three times a day (TID) | ORAL | Status: DC | PRN
  Administered 2021-08-12 – 2021-08-13 (×3): 5 mg via ORAL
  Filled 2021-08-12 (×3): qty 1

## 2021-08-12 MED ORDER — LORAZEPAM 0.5 MG PO TABS: 0.5000 mg | ORAL_TABLET | Freq: Three times a day (TID) | ORAL | Status: DC | PRN

## 2021-08-12 MED ORDER — METHOCARBAMOL 500 MG PO TABS: 500.0000 mg | ORAL_TABLET | Freq: Two times a day (BID) | ORAL | Status: DC

## 2021-08-12 MED ORDER — ACETAMINOPHEN 325 MG PO TABS
ORAL_TABLET | ORAL | Status: AC
  Administered 2021-08-12: 650 mg via ORAL
  Filled 2021-08-12: qty 2

## 2021-08-12 MED ORDER — DEXAMETHASONE SODIUM PHOSPHATE 10 MG/ML IJ SOLN
10.0000 mg | Freq: Once | INTRAMUSCULAR | Status: AC
  Administered 2021-08-12: 10 mg via INTRAVENOUS
  Filled 2021-08-12: qty 1

## 2021-08-12 MED ORDER — ALPRAZOLAM 0.5 MG PO TABS
0.5000 mg | ORAL_TABLET | Freq: Every evening | ORAL | Status: DC | PRN
  Administered 2021-08-13 (×2): 0.5 mg via ORAL
  Filled 2021-08-12 (×2): qty 1

## 2021-08-12 MED ORDER — INSULIN ASPART 100 UNIT/ML IJ SOLN
10.0000 [IU] | Freq: Once | INTRAMUSCULAR | Status: AC
  Administered 2021-08-12: 10 [IU] via SUBCUTANEOUS

## 2021-08-12 MED ORDER — SODIUM CHLORIDE 0.9 % IV SOLN: INTRAVENOUS | Status: AC

## 2021-08-12 MED ORDER — AMITRIPTYLINE HCL 25 MG PO TABS: 25.0000 mg | ORAL_TABLET | Freq: Every day | ORAL | Status: DC

## 2021-08-12 MED ORDER — HOME MED STORE IN PYXIS
1.0000 | Freq: Every day | Status: DC
  Administered 2021-08-12 – 2021-08-13 (×2): 1 via SUBCUTANEOUS
  Filled 2021-08-12 (×3): qty 1

## 2021-08-12 MED ORDER — SODIUM CHLORIDE 0.9 % IV SOLN
2.0000 g | INTRAVENOUS | Status: DC
  Administered 2021-08-13 – 2021-08-14 (×2): 2 g via INTRAVENOUS
  Filled 2021-08-12 (×3): qty 20

## 2021-08-12 MED ORDER — ACETAMINOPHEN 325 MG PO TABS
650.0000 mg | ORAL_TABLET | Freq: Four times a day (QID) | ORAL | Status: DC | PRN
  Administered 2021-08-14: 650 mg via ORAL
  Filled 2021-08-12: qty 2

## 2021-08-12 MED ORDER — ONDANSETRON HCL 4 MG PO TABS: 4.0000 mg | ORAL_TABLET | Freq: Four times a day (QID) | ORAL | Status: DC | PRN

## 2021-08-12 MED ORDER — ONDANSETRON HCL 4 MG/2ML IJ SOLN: 4.0000 mg | Freq: Four times a day (QID) | INTRAMUSCULAR | Status: DC | PRN

## 2021-08-12 MED ORDER — HYDROMORPHONE HCL 1 MG/ML IJ SOLN
0.5000 mg | Freq: Once | INTRAMUSCULAR | Status: AC
  Administered 2021-08-12: 0.5 mg via INTRAVENOUS
  Filled 2021-08-12: qty 1

## 2021-08-12 MED ORDER — HYDROMORPHONE HCL 1 MG/ML IJ SOLN
0.5000 mg | INTRAMUSCULAR | Status: DC | PRN
Start: 2021-08-12 — End: 2021-08-15
  Administered 2021-08-12 – 2021-08-13 (×5): 0.5 mg via INTRAVENOUS
  Filled 2021-08-12 (×5): qty 0.5

## 2021-08-12 MED ORDER — PREGABALIN 75 MG PO CAPS
200.0000 mg | ORAL_CAPSULE | Freq: Two times a day (BID) | ORAL | Status: DC
  Administered 2021-08-12 – 2021-08-14 (×4): 200 mg via ORAL
  Filled 2021-08-12 (×4): qty 1

## 2021-08-12 MED ORDER — INSULIN GLARGINE-YFGN 100 UNIT/ML ~~LOC~~ SOLN
20.0000 [IU] | Freq: Every day | SUBCUTANEOUS | Status: DC
  Filled 2021-08-12: qty 0.2

## 2021-08-12 MED ORDER — IOHEXOL 350 MG/ML SOLN
100.0000 mL | Freq: Once | INTRAVENOUS | Status: AC | PRN
  Administered 2021-08-12: 100 mL via INTRAVENOUS

## 2021-08-12 MED ORDER — ACETAMINOPHEN 650 MG RE SUPP: 650.0000 mg | Freq: Four times a day (QID) | RECTAL | Status: DC | PRN

## 2021-08-12 MED ORDER — INSULIN ASPART 100 UNIT/ML IJ SOLN
2.0000 [IU] | INTRAMUSCULAR | Status: DC | PRN
  Administered 2021-08-12: 15 [IU] via SUBCUTANEOUS

## 2021-08-12 MED ORDER — TRAMADOL HCL 50 MG PO TABS: 50.0000 mg | ORAL_TABLET | Freq: Two times a day (BID) | ORAL | Status: DC | PRN

## 2021-08-12 MED ORDER — KETOROLAC TROMETHAMINE 15 MG/ML IJ SOLN
15.0000 mg | Freq: Once | INTRAMUSCULAR | Status: AC
  Administered 2021-08-12: 15 mg via INTRAVENOUS
  Filled 2021-08-12: qty 1

## 2021-08-12 MED ORDER — POTASSIUM CHLORIDE CRYS ER 20 MEQ PO TBCR: 20.0000 meq | EXTENDED_RELEASE_TABLET | Freq: Once | ORAL | Status: DC

## 2021-08-12 MED ORDER — PANTOPRAZOLE SODIUM 40 MG IV SOLR
40.0000 mg | Freq: Once | INTRAVENOUS | Status: AC
  Administered 2021-08-12: 40 mg via INTRAVENOUS
  Filled 2021-08-12: qty 40

## 2021-08-12 MED ORDER — SODIUM CHLORIDE 0.9 % IV SOLN
2.0000 g | Freq: Once | INTRAVENOUS | Status: AC
  Administered 2021-08-12: 2 g via INTRAVENOUS
  Filled 2021-08-12: qty 20

## 2021-08-12 MED ORDER — QUETIAPINE FUMARATE 50 MG PO TABS: 50.0000 mg | ORAL_TABLET | Freq: Every day | ORAL | Status: DC

## 2021-08-12 MED ORDER — INSULIN GLARGINE 100 UNIT/ML ~~LOC~~ SOLN
20.0000 [IU] | Freq: Every day | SUBCUTANEOUS | Status: DC
  Administered 2021-08-12: 20 [IU] via SUBCUTANEOUS

## 2021-08-12 MED ORDER — LIDOCAINE-EPINEPHRINE (PF) 2 %-1:200000 IJ SOLN
10.0000 mL | Freq: Once | INTRAMUSCULAR | Status: AC
  Administered 2021-08-12: 10 mL
  Filled 2021-08-12: qty 20

## 2021-08-12 MED ORDER — SODIUM CHLORIDE 0.9 % IV BOLUS
500.0000 mL | Freq: Once | INTRAVENOUS | Status: AC
  Administered 2021-08-12: 500 mL via INTRAVENOUS

## 2021-08-12 MED ORDER — CELECOXIB 200 MG PO CAPS
200.0000 mg | ORAL_CAPSULE | ORAL | Status: DC | PRN
  Administered 2021-08-13 (×2): 200 mg via ORAL
  Filled 2021-08-12 (×5): qty 1

## 2021-08-12 MED ORDER — BUPROPION HCL ER (XL) 300 MG PO TB24
300.0000 mg | ORAL_TABLET | Freq: Every day | ORAL | Status: DC
  Administered 2021-08-12 – 2021-08-13 (×2): 300 mg via ORAL
  Filled 2021-08-12 (×2): qty 1

## 2021-08-12 MED ORDER — POTASSIUM CHLORIDE CRYS ER 20 MEQ PO TBCR
40.0000 meq | EXTENDED_RELEASE_TABLET | Freq: Once | ORAL | Status: AC
  Administered 2021-08-12: 40 meq via ORAL
  Filled 2021-08-12: qty 2

## 2021-08-12 MED ORDER — ACETAMINOPHEN 325 MG PO TABS: 650.0000 mg | ORAL_TABLET | Freq: Four times a day (QID) | ORAL | Status: DC | PRN

## 2021-08-12 MED ORDER — ENSURE ENLIVE PO LIQD
237.0000 mL | Freq: Two times a day (BID) | ORAL | Status: DC
  Administered 2021-08-12 – 2021-08-14 (×4): 237 mL via ORAL

## 2021-08-12 NOTE — Sepsis Progress Note (Signed)
Following for sepsis monitoring ?

## 2021-08-12 NOTE — ED Notes (Signed)
Patient Alert and oriented. States having no unless she moves then her back hurts from the LP. Pillow placed behind back for comfort. Patients that helped. Skin warm and dry. Provided ice water and crackers to patient. Patient aware of admission and reason for admission. IV intact LAC at 125/hr.  Temp rechecked 98.6 orally.

## 2021-08-12 NOTE — ED Notes (Signed)
ED Provider at bedside. 

## 2021-08-12 NOTE — ED Notes (Signed)
A cup of water and a pair of socks were given to the Pt.

## 2021-08-12 NOTE — Plan of Care (Signed)

## 2021-08-12 NOTE — ED Notes (Signed)
Pt given crackers and orange sherbet

## 2021-08-12 NOTE — ED Notes (Signed)
Report called to receiving nurse Luanne Bras RN at [facility]

## 2021-08-12 NOTE — ED Notes (Addendum)
Report given to Nicole RN with [facility] 

## 2021-08-12 NOTE — ED Notes (Signed)
Pt is not ready for CT. RN to call Radiology.

## 2021-08-12 NOTE — ED Notes (Signed)
ED Provider at bedside doing procedure

## 2021-08-12 NOTE — ED Notes (Signed)
Pt voided a large amount of amber cloudy urine

## 2021-08-12 NOTE — H&P (Addendum)
History and Physical    Phyllis Pittman [id number] DOB: 1976-01-30 DOA: 08/11/2021  PCP: Trey Sailors, PA Patient coming from: home  Chief Complaint: fevers and back pain  HPI: Phyllis Pittman is a 45 y.o. female with medical history significant of T2DM, HTN, HLD, CVA, anxiety, depression, fibromyalgia, IBS, OSA who presented with fevers and back pain.  Patient reports that she started to have multiple symptoms including intermittent low-grade fever with unclear temperature, sweating, headaches, neck pain, bilateral back pain, urinary pressure-like feeling about 4-5 days ago.  Associate symptoms included nausea, decreased appetite and generalized weakness she went to see her PCP yesterday and was found to have UTI.  She was sent to ED for further evaluation last night.  Denies shortness of breath, cough, wheezing, chest pain, vomiting, diarrhea, abdominal pain, dysuria, urinary frequency or urgency.  In the emergency room, her T-max was 101.14F, pulse 124, RR 18, BP 119/75 with room air O2 sats 98%.  The labs showed WBC 15.1, lactic acid 2.2, potassium 3.1, glucose 335, negative COVID/flu, urine showing pyuria, LP showing elevated glucose 193 and elevated total protein 68.  Abdominal CT suggested acute pyonephritis left greater than right.  Patient was given ceftriaxone 2 g IV and transferred to [facility] for further management.  Review of Systems: As per HPI otherwise 10 point review of systems negative.  Review of Systems Otherwise negative except as per HPI, including: General: Positive for fever, chills, sweats, generalized weakness Resp: Denies cough, wheezing, shortness of breath. Cardiac: Denies chest pain, palpitations, orthopnea, paroxysmal nocturnal dyspnea. GI: Denies abdominal pain, vomiting, diarrhea or constipation.  Positive for nausea, decreased appetite GU: Denies dysuria, frequency, hesitancy or incontinence.  Positive for urinary pressure-like feeling MS: Positive  for headaches, neck pain and the back pain. 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   CVA (cerebral vascular accident) ([place])    hx of   Depression with anxiety    Diabetes mellitus without complication ([place])    type 2   Female hirsutism    Fibromyalgia    GERD without esophagitis    Hyperlipidemia    Hypertension    IBS (irritable bowel syndrome)    Long term (current) use of insulin ([place])    Migraine    Myopia of both eyes    Neuropathy    feet   Neuropathy    OSA (obstructive sleep apnea)    Sciatica    Shingles     Past Surgical History:  Procedure Laterality Date   CESAREAN SECTION     05/2001, 07/2012   CHOLECYSTECTOMY  10/1999   FACIAL COSMETIC SURGERY     FOOT SURGERY      SOCIAL HISTORY:  reports that she has been smoking cigarettes. She has been smoking an average of .25 packs per day. She has never used smokeless tobacco. She reports current alcohol use. She reports that she does not use drugs.  Allergies  Allergen Reactions   Dust Mite Extract Anxiety, Hives, Itching, Rash, Shortness Of Breath and Swelling   Molds & Smuts Anaphylaxis, Anxiety, Hives, Itching, Nausea Only, Rash, Shortness Of Breath and Swelling   Prednisone Other (See Comments)    Other reaction(s): Other (See Comments) Hyperglycemia. Hyperglycemia. Other reaction(s): Other (See Comments)  Hyperglycemia.    Venlafaxine Other (See Comments) and Shortness Of Breath    Chest discomfort    Latex Rash    Powder only    Metformin Nausea And Vomiting, Nausea Only and Other (See Comments)   Acyclovir Itching and Nausea Only   Codeine Other (See Comments)    Unknown   Famotidine    Levaquin [Levofloxacin] Nausea And  Vomiting    Po only can take iv   Semaglutide Other (See Comments)    Made her really sick   Trulicity [Dulaglutide] Nausea And Vomiting    FAMILY HISTORY: Family History  Problem Relation Age of Onset   COPD Mother    Diabetes Mother    Cancer Mother    Diabetes Father    Cancer Father      Prior to Admission medications   Medication Sig Start Date End Date Taking? Authorizing Provider  ALPRAZolam Duanne Moron) 0.5 MG tablet Take 1 tablet (0.5 mg total) by mouth at bedtime as needed for anxiety. 07/13/21   Drenda Freeze, MD  amitriptyline (ELAVIL) 25 MG tablet Take 25-50 mg by mouth at bedtime. 01/21/20   [provider]  buPROPion (WELLBUTRIN XL) 300 MG 24 hr tablet Take 300 mg by mouth at bedtime. 08/05/20   [provider]  celecoxib (CELEBREX) 200 MG capsule Take 200 mg by mouth as needed for mild pain.  10/22/19   [provider]  cyclobenzaprine (FLEXERIL) 5 MG tablet Take 1 tablet (5 mg total) by mouth 3 (three) times daily as needed for muscle spasms. 07/13/21   Drenda Freeze, MD  hydrOXYzine (VISTARIL) 25 MG capsule Take 25 mg by mouth at bedtime. 02/02/20   [provider]  ibuprofen (ADVIL) 800 MG tablet Take 800 mg by mouth 3 (three) times daily. 10/02/20   [provider]  LORazepam (ATIVAN) 0.5 MG tablet Take 1 tablet (0.5 mg total) by mouth 3 (three) times daily as needed for anxiety. 02/16/20   Tegeler, Gwenyth Allegra, MD  methocarbamol (ROBAXIN) 500 MG tablet Take 1 tablet (500 mg total) by mouth 2 (two) times daily. 07/22/19   Volanda Napoleon, PA-C  naproxen (NAPROSYN) 500 MG tablet Take 500 mg by mouth as needed for mild pain.    [provider]  NOVOLOG 100 UNIT/ML injection Inject into the skin. 05/09/20   [provider]  ondansetron (ZOFRAN ODT) 8 MG disintegrating tablet Take 1 tablet (8 mg total) by mouth every 8 (eight) hours as needed for nausea or vomiting. 02/22/18   Kirichenko, Tatyana, PA-C   pregabalin (LYRICA) 200 MG capsule Take 200 mg by mouth 2 (two) times daily. 08/31/20   [provider]  QUEtiapine (SEROQUEL) 50 MG tablet Take 50 mg by mouth at bedtime. 05/09/20   [provider]  tiZANidine (ZANAFLEX) 4 MG tablet Take 4 mg by mouth as needed for muscle spasms.     [provider]  traMADol (ULTRAM) 50 MG tablet Take 50 mg by mouth 2 (two) times daily as needed. 10/02/20   [provider]    Physical Exam: Vitals:   08/12/21 1000 08/12/21 1008 08/12/21 1104 08/12/21 1500  BP: 105/75 111/76 106/74 105/76  Pulse: 93 91 92 87  Resp: 15 (!) 37 19 18  Temp:  98.4 F (36.9 C) 98 F (36.7 C) 98 F (36.7 C)  TempSrc:  Oral Oral Oral  SpO2: 100% 100% 98% 99%  Weight:      Height:  Constitutional: NAD, calm, comfortable.  Acute ill-appearing. Eyes: PERRL, lids and conjunctivae normal ENMT: Mucous membranes are moist. Posterior pharynx clear of any exudate or lesions.Normal dentition.  Neck: normal, supple, no masses, no thyromegaly Respiratory: clear to auscultation bilaterally, no wheezing, no crackles. Normal respiratory effort. No accessory muscle use.  Cardiovascular: Regular rate and rhythm, no murmurs / rubs / gallops. No extremity edema. 2+ pedal pulses. No carotid bruits.  Abdomen: no tenderness, no masses palpated. No hepatosplenomegaly. Bowel sounds positive.  Bilateral CVA tenderness Musculoskeletal: no clubbing / cyanosis. No joint deformity upper and lower extremities. Good ROM, no contractures. Normal muscle tone.  Skin: no rashes, lesions, ulcers. No induration Neurologic: CN 2-12 grossly intact. Sensation intact, DTR normal. Strength 5/5 in all 4.  Psychiatric: Normal judgment and insight. Alert and oriented x 3. Normal mood.     Labs on Admission: I have personally reviewed following labs and imaging studies  CBC: Recent Labs  Lab 08/11/21 2316  WBC 15.1*  NEUTROABS 12.8*  HGB 15.2*  HCT 43.4  MCV  86.6  PLT 123456   Basic Metabolic Panel: Recent Labs  Lab 08/11/21 2316  Phyllis Pittman 133*  K 3.1*  CL 95*  CO2 26  GLUCOSE 335*  BUN 9  CREATININE 0.78  CALCIUM 9.2   GFR: Estimated Creatinine Clearance: 75.2 mL/min (by C-G formula based on SCr of 0.78 mg/dL). Liver Function Tests: Recent Labs  Lab 08/11/21 2316  AST 27  ALT 21  ALKPHOS 103  BILITOT 0.5  PROT 8.5*  ALBUMIN 4.0   Recent Labs  Lab 08/11/21 2316  LIPASE 23   No results for input(s): AMMONIA in the last 168 hours. Coagulation Profile: Recent Labs  Lab 08/11/21 2316  INR 1.1   Cardiac Enzymes: No results for input(s): CKTOTAL, CKMB, CKMBINDEX, TROPONINI in the last 168 hours. BNP (last 3 results) No results for input(s): PROBNP in the last 8760 hours. HbA1C: No results for input(s): HGBA1C in the last 72 hours. CBG: Recent Labs  Lab 08/12/21 1239 08/12/21 1331 08/12/21 1500  GLUCAP 500* 433* 397*   Lipid Profile: No results for input(s): CHOL, HDL, LDLCALC, TRIG, CHOLHDL, LDLDIRECT in the last 72 hours. Thyroid Function Tests: No results for input(s): TSH, T4TOTAL, FREET4, T3FREE, THYROIDAB in the last 72 hours. Anemia Panel: No results for input(s): VITAMINB12, FOLATE, FERRITIN, TIBC, IRON, RETICCTPCT in the last 72 hours. Urine analysis:    Component Value Date/Time   COLORURINE YELLOW 08/12/2021 0253   APPEARANCEUR CLOUDY (A) 08/12/2021 0253   LABSPEC 1.015 08/12/2021 0253   PHURINE 6.0 08/12/2021 0253   GLUCOSEU >=500 (A) 08/12/2021 0253   HGBUR LARGE (A) 08/12/2021 0253   BILIRUBINUR LARGE (A) 08/12/2021 0253   KETONESUR 40 (A) 08/12/2021 0253   PROTEINUR 30 (A) 08/12/2021 0253   NITRITE NEGATIVE 08/12/2021 0253   LEUKOCYTESUR SMALL (A) 08/12/2021 0253   Sepsis Labs: !!!!!!!!!!!!!!!!!!!!!!!!!!!!!!!!!!!!!!!!!!!! '@[id number]'$ (procalcitonin:4,lacticidven:4) ) Recent Results (from the past 240 hour(s))  Resp Panel by RT-PCR (Flu A&B, Covid) Nasopharyngeal Swab     Status: None    Collection Time: 08/11/21 11:16 PM   Specimen: Nasopharyngeal Swab; Nasopharyngeal(NP) swabs in vial transport medium  Result Value Ref Range Status   SARS Coronavirus 2 by RT PCR NEGATIVE NEGATIVE Final    Comment: (NOTE) SARS-CoV-2 target nucleic acids are NOT DETECTED.  The SARS-CoV-2 RNA is generally detectable in upper respiratory specimens during the acute phase of infection. The lowest concentration of SARS-CoV-2 viral copies this assay can detect is 138 copies/mL. A  negative result does not preclude SARS-Cov-2 infection and should not be used as the sole basis for treatment or other patient management decisions. A negative result may occur with  improper specimen collection/handling, submission of specimen other than nasopharyngeal swab, presence of viral mutation(s) within the areas targeted by this assay, and inadequate number of viral copies(<138 copies/mL). A negative result must be combined with clinical observations, patient history, and epidemiological information. The expected result is Negative.  Fact Sheet for Patients:  EntrepreneurPulse.com.au  Fact Sheet for Healthcare Providers:  IncredibleEmployment.be  This test is no t yet approved or cleared by the [place] FDA and  has been authorized for detection and/or diagnosis of SARS-CoV-2 by FDA under an Emergency Use Authorization (EUA). This EUA will remain  in effect (meaning this test can be used) for the duration of the COVID-19 declaration under Section 564(b)(1) of the Act, 21 U.S.C.section 360bbb-3(b)(1), unless the authorization is terminated  or revoked sooner.       Influenza A by PCR NEGATIVE NEGATIVE Final   Influenza B by PCR NEGATIVE NEGATIVE Final    Comment: (NOTE) The Xpert Xpress SARS-CoV-2/FLU/RSV plus assay is intended as an aid in the diagnosis of influenza from Nasopharyngeal swab specimens and should not be used as a sole basis for treatment.  Nasal washings and aspirates are unacceptable for Xpert Xpress SARS-CoV-2/FLU/RSV testing.  Fact Sheet for Patients: EntrepreneurPulse.com.au  Fact Sheet for Healthcare Providers: IncredibleEmployment.be  This test is not yet approved or cleared by the [place] FDA and has been authorized for detection and/or diagnosis of SARS-CoV-2 by FDA under an Emergency Use Authorization (EUA). This EUA will remain in effect (meaning this test can be used) for the duration of the COVID-19 declaration under Section 564(b)(1) of the Act, 21 U.S.C. section 360bbb-3(b)(1), unless the authorization is terminated or revoked.  Performed at [facility], [address] [place], [place] [address]   Gram stain     Status: None   Collection Time: 08/12/21  1:34 AM   Specimen: Back; Cerebrospinal Fluid  Result Value Ref Range Status   Specimen Description   Final    LUMBAR Performed at [facility], [address] [place], [place] [address]    Special Requests   Final    NONE Performed at [facility], [address] [place], [place] [address]    Gram Stain   Final    NO SQUAMOUS EPITHELIAL CELLS SEEN NO WBC SEEN NO ORGANISMS SEEN Performed at [facility], [place] [address] [place], [place] [address]    Report Status 08/12/2021 FINAL  Final     Radiological Exams on Admission: CT ABDOMEN PELVIS W CONTRAST  Result Date: 08/12/2021 CLINICAL DATA:  Abdominal pain, fever EXAM: CT ABDOMEN AND PELVIS WITH CONTRAST TECHNIQUE: Multidetector CT imaging of the abdomen and pelvis was performed using the standard protocol following bolus administration of intravenous contrast. CONTRAST:  171m OMNIPAQUE IOHEXOL 350 MG/ML SOLN COMPARISON:  07/13/2021 FINDINGS: Lower chest: Lung bases are clear. Hepatobiliary: Liver is within normal limits. Status post cholecystectomy. No intrahepatic or extrahepatic ductal dilatation. Pancreas:  Within normal limits. Spleen: Within normal limits. Adrenals/Urinary Tract: Adrenal glands are within normal limits. Multiple punctate nonobstructing left renal calculi measuring up to 2 mm (for example, series 2/image 40). Heterogeneous enhancement of the bilateral kidneys, including the lateral right upper pole (series 2/image 33) and multiple areas within the left kidney (series 2/images 29, 36, 44, and  46), reflecting pyelonephritis. No hydronephrosis. Bladder is within normal limits. Stomach/Bowel: Stomach is within normal limits. No evidence of bowel obstruction. Appendix is not discretely visualized. Vascular/Lymphatic: No evidence of abdominal aortic aneurysm. No suspicious abdominopelvic lymphadenopathy. Reproductive: Uterus is within normal limits. Bilateral ovaries are within normal limits. Other: No abdominopelvic ascites. Musculoskeletal: Visualized osseous structures are within normal limits. IMPRESSION: Acute pyelonephritis, left greater than right. Multiple punctate nonobstructing left renal calculi measuring up to 2 mm. No hydronephrosis. Electronically Signed   By: Julian Hy M.D.   On: 08/12/2021 02:48   DG Chest Portable 1 View  Result Date: 08/11/2021 CLINICAL DATA:  Fever EXAM: PORTABLE CHEST 1 VIEW COMPARISON:  02/15/2020 FINDINGS: The heart size and mediastinal contours are within normal limits. Both lungs are clear. The visualized skeletal structures are unremarkable. IMPRESSION: No active disease. Electronically Signed   By: Donavan Foil M.D.   On: 08/11/2021 23:49     All images have been reviewed by me personally.  EKG: Independently reviewed.   Assessment/Plan Principal Problem:   Sepsis ([place]) Active Problems:   Pyelonephritis   Lactic acidosis   Hyponatremia   Hypokalemia   Hyperglycemia   T2DM (type 2 diabetes mellitus) ([facility])   HTN (hypertension)   HLD (hyperlipidemia)   Anxiety and depression    Assessment Plan  #Sepsis #Lactic acidosis  - Tmax  101.6, WBC 15K, lactic acid 2.2 at ED -fever is resolved.  lactic acidosis is resolved - CBC in am   #Acute pyonephritis left greater than right #UTI - Blood and urine Cx pending (urine culture was not sent at outside ED so it was sent while she arrived here) - Ceftriaxone 2g daily   #Hyponatremia and hypokalemia  - replaced - she declined blood draw today - will FU on BMP in am   #Hyperglycemia #T2DM - BG up to 500 - HGB A1C pending - Glu AC and HS - novolog SSi - IVF for hydration - She requested to continue home basal insulin basaglar 20 u QHS, which I will ask pharmacy to verify.   # HTN and HLD  -Chronic and stable  #Anxiety and depression -Chronic and at baseline  # Prolonged Qtc  - tele metry monitoring - EKG on 08/11/21 showed prolonged QTc - Edwen Mclester and K replaced - she refused repeat blood draw today - EKG is pending - avoid meds that could prolong Qtc    Nutritional status  Body mass index is 27.73 kg/m.        DVT prophylaxis: Lovenox Code Status: Full code Family Communication: none at bedside Consults called: N/A Admission status: inpatient  Status is: Inpatient  Remains inpatient appropriate because:Hemodynamically unstable  Dispo: The patient is from: Home              Anticipated d/c is to: Home              Patient currently is not medically stable to d/c.   Difficult to place patient No       Time Spent: 65 minutes.  >50% of the time was devoted to discussing the patients care, assessment, plan and disposition with other care givers along with counseling the patient about the risks and benefits of treatment.    Charlann Lange MD [facility]  If 7PM-7AM, please contact night-coverage   08/12/2021, 4:21 PM

## 2021-08-12 NOTE — ED Notes (Signed)
Patient transported to CT 

## 2021-08-13 ENCOUNTER — Other Ambulatory Visit: Payer: Self-pay

## 2021-08-13 DIAGNOSIS — A419 Sepsis, unspecified organism: Principal | ICD-10-CM

## 2021-08-13 LAB — BASIC METABOLIC PANEL
Anion gap: 10 (ref 5–15)
BUN: 12 mg/dL (ref 6–20)
CO2: 19 mmol/L — ABNORMAL LOW (ref 22–32)
Calcium: 8.7 mg/dL — ABNORMAL LOW (ref 8.9–10.3)
Chloride: 106 mmol/L (ref 98–111)
Creatinine, Ser: 0.58 mg/dL (ref 0.44–1.00)
GFR, Estimated: >60 mL/min (ref >60)
Glucose, Bld: 344 mg/dL — ABNORMAL HIGH (ref 70–99)
Potassium: 3.8 mmol/L (ref 3.5–5.1)
Sodium: 135 mmol/L (ref 135–145)

## 2021-08-13 LAB — CBC
HCT: 33.8 % — ABNORMAL LOW (ref 36.0–46.0)
Hemoglobin: 11.3 g/dL — ABNORMAL LOW (ref 12.0–15.0)
MCH: 29.8 pg (ref 26.0–34.0)
MCHC: 33.4 g/dL (ref 30.0–36.0)
MCV: 89.2 fL (ref 80.0–100.0)
Platelets: 345 10*3/uL (ref 150–400)
RBC: 3.79 MIL/uL — ABNORMAL LOW (ref 3.87–5.11)
RDW: 12.2 % (ref 11.5–15.5)
WBC: 19.6 10*3/uL — ABNORMAL HIGH (ref 4.0–10.5)
nRBC: 0 % (ref 0.0–0.2)

## 2021-08-13 LAB — MAGNESIUM: Magnesium: 2 mg/dL (ref 1.7–2.4)

## 2021-08-13 LAB — GLUCOSE, CAPILLARY
Glucose-Capillary: 275 mg/dL — ABNORMAL HIGH (ref 70–99)
Glucose-Capillary: 244 mg/dL — ABNORMAL HIGH (ref 70–99)
Glucose-Capillary: 297 mg/dL — ABNORMAL HIGH (ref 70–99)
Glucose-Capillary: 394 mg/dL — ABNORMAL HIGH (ref 70–99)

## 2021-08-13 LAB — URINE CULTURE: Culture: NO GROWTH

## 2021-08-13 LAB — HEMOGLOBIN A1C
Hgb A1c MFr Bld: 12.8 % — ABNORMAL HIGH (ref 4.8–5.6)
Mean Plasma Glucose: 320.66 mg/dL

## 2021-08-13 MED ORDER — HYDROXYZINE HCL 10 MG PO TABS
10.0000 mg | ORAL_TABLET | Freq: Three times a day (TID) | ORAL | Status: DC | PRN
  Administered 2021-08-13: 10 mg via ORAL
  Filled 2021-08-13 (×3): qty 1

## 2021-08-13 MED ORDER — INSULIN ASPART 100 UNIT/ML IJ SOLN
10.0000 [IU] | Freq: Once | INTRAMUSCULAR | Status: AC
  Administered 2021-08-13: 10 [IU] via SUBCUTANEOUS

## 2021-08-13 MED ORDER — TRAMADOL HCL 50 MG PO TABS
50.0000 mg | ORAL_TABLET | Freq: Two times a day (BID) | ORAL | Status: DC | PRN
Start: 2021-08-13 — End: 2021-08-15
  Administered 2021-08-13: 50 mg via ORAL
  Filled 2021-08-13: qty 1

## 2021-08-13 MED ORDER — INSULIN ASPART 100 UNIT/ML IJ SOLN
4.0000 [IU] | Freq: Three times a day (TID) | INTRAMUSCULAR | Status: DC
  Administered 2021-08-13 – 2021-08-14 (×6): 4 [IU] via SUBCUTANEOUS

## 2021-08-13 MED ORDER — BUSPIRONE HCL 5 MG PO TABS
10.0000 mg | ORAL_TABLET | Freq: Every day | ORAL | Status: DC
  Administered 2021-08-13 (×2): 10 mg via ORAL
  Filled 2021-08-13 (×2): qty 2

## 2021-08-13 MED ORDER — AMITRIPTYLINE HCL 25 MG PO TABS
25.0000 mg | ORAL_TABLET | Freq: Every day | ORAL | Status: DC
  Administered 2021-08-13: 25 mg via ORAL
  Filled 2021-08-13: qty 1

## 2021-08-13 MED ORDER — INSULIN ASPART 100 UNIT/ML IJ SOLN
0.0000 [IU] | Freq: Three times a day (TID) | INTRAMUSCULAR | Status: DC
  Administered 2021-08-13 (×2): 8 [IU] via SUBCUTANEOUS
  Administered 2021-08-13 – 2021-08-14 (×4): 5 [IU] via SUBCUTANEOUS

## 2021-08-13 MED ORDER — VALACYCLOVIR HCL 500 MG PO TABS
500.0000 mg | ORAL_TABLET | Freq: Every day | ORAL | Status: DC
  Administered 2021-08-13 – 2021-08-14 (×2): 500 mg via ORAL
  Filled 2021-08-13 (×2): qty 1

## 2021-08-13 MED ORDER — INSULIN ASPART 100 UNIT/ML IJ SOLN
0.0000 [IU] | Freq: Every day | INTRAMUSCULAR | Status: DC
  Administered 2021-08-13: 5 [IU] via SUBCUTANEOUS

## 2021-08-13 MED ORDER — ASPIRIN EC 81 MG PO TBEC
81.0000 mg | DELAYED_RELEASE_TABLET | Freq: Every day | ORAL | Status: DC
  Administered 2021-08-13 (×2): 81 mg via ORAL
  Filled 2021-08-13 (×2): qty 1

## 2021-08-13 NOTE — Progress Notes (Signed)
PROGRESS NOTE    Phyllis Pittman  [id number] DOB: 04/02/1976 DOA: 08/11/2021 PCP: Trey Sailors, PA     Brief Narrative:  Phyllis Pittman is a 45 y.o. female with medical history significant of T2DM, HTN, HLD, CVA, anxiety, depression, fibromyalgia, IBS, OSA who presented with fevers and back pain.   Patient reports that she started to have multiple symptoms including intermittent low-grade fever with unclear temperature, sweating, headaches, neck pain, bilateral back pain, urinary pressure-like feeling about 4-5 days ago.  Associate symptoms included nausea, decreased appetite and generalized weakness she went to see her PCP yesterday and was found to have UTI.  She was sent to ED for further evaluation last night.  Abdominal CT suggested acute pyonephritis left greater than right.  Patient was given ceftriaxone 2 g IV and transferred to [facility] long for further management.  New events last 24 hours / Subjective: Patient continues to have headache.  Concerned about multiple home medications that were not given last night after admission.  Assessment & Plan:   Principal Problem:   Sepsis ([place]) Active Problems:   Pyelonephritis   Lactic acidosis   Hyponatremia   Hypokalemia   Hyperglycemia   T2DM (type 2 diabetes mellitus) ([place])   HTN (hypertension)   HLD (hyperlipidemia)   Anxiety and depression   Sepsis secondary to pyelonephritis -Blood culture pending -Urine culture pending -Continue Rocephin  Diabetes mellitus type 2, uncontrolled with hyperglycemia -Hemoglobin A1c 12.8 -Continue home Lantus -Increase sliding scale insulin and added mealtime dosing due to hyperglycemia  Anxiety, depression -Continue Xanax, Wellbutrin, BuSpar, Atarax  Herpes -Continue Valtrex   DVT prophylaxis:  enoxaparin (LOVENOX) injection 40 mg Start: 08/12/21 2200  Code Status:     Code Status Orders  (From admission, onward)           Start     Ordered   08/12/21 1620   Full code  Continuous        08/12/21 1621           Code Status History     This patient has a current code status but no historical code status.      Family Communication: No family at bedside Disposition Plan:  Status is: Inpatient  Remains inpatient appropriate because:IV treatments appropriate due to intensity of illness or inability to take PO and Inpatient level of care appropriate due to severity of illness  Dispo: The patient is from: Home              Anticipated d/c is to: Home              Patient currently is not medically stable to d/c.   Difficult to place patient No      Consultants:  None  Procedures:  None  Antimicrobials:  Anti-infectives (From admission, onward)    Start     Dose/Rate Route Frequency Ordered Stop   08/13/21 1100  valACYclovir (VALTREX) tablet 500 mg        500 mg Oral Daily 08/13/21 1010     08/13/21 0200  cefTRIAXone (ROCEPHIN) 2 g in sodium chloride 0.9 % 100 mL IVPB        2 g 200 mL/hr over 30 Minutes Intravenous Every 24 hours 08/12/21 1553     08/12/21 0145  cefTRIAXone (ROCEPHIN) 2 g in sodium chloride 0.9 % 100 mL IVPB        2 g 200 mL/hr over 30 Minutes Intravenous  Once 08/12/21 0137 08/12/21 [id number]  Objective: Vitals:   08/13/21 0536 08/13/21 0821 08/13/21 0900 08/13/21 0942  BP: 118/82 113/76    Pulse: 84 78    Resp: 18 (!) '22 14 18  '$ Temp: 97.6 F (36.4 C) 97.8 F (36.6 C)    TempSrc:  Oral    SpO2: 97% 98%    Weight:      Height:        Intake/Output Summary (Last 24 hours) at 08/13/2021 1255 Last data filed at 08/13/2021 0300 Gross per 24 hour  Intake 1119.3 ml  Output 1600 ml  Net -480.7 ml   Filed Weights   08/11/21 2021  Weight: 64.4 kg    Examination:  General exam: Appears calm and comfortable  Respiratory system: Clear to auscultation. Respiratory effort normal. No respiratory distress. No conversational dyspnea.  Cardiovascular system: S1 & S2 heard, RRR. No murmurs. No pedal  edema. Gastrointestinal system: Abdomen is nondistended, soft and nontender. Normal bowel sounds heard. Central nervous system: Alert and oriented. No focal neurological deficits. Speech clear.  Extremities: Symmetric in appearance  Skin: No rashes, lesions or ulcers on exposed skin  Psychiatry: Judgement and insight appear normal. Mood & affect appropriate.   Data Reviewed: I have personally reviewed following labs and imaging studies  CBC: Recent Labs  Lab 08/11/21 2316 08/13/21 0626  WBC 15.1* 19.6*  NEUTROABS 12.8*  --   HGB 15.2* 11.3*  HCT 43.4 33.8*  MCV 86.6 89.2  PLT 364 [id number]   Basic Metabolic Panel: Recent Labs  Lab 08/11/21 2316 08/13/21 0626  NA 133* 135  K 3.1* 3.8  CL 95* 106  CO2 26 19*  GLUCOSE 335* 344*  BUN 9 12  CREATININE 0.78 0.58  CALCIUM 9.2 8.7*  MG  --  2.0   GFR: Estimated Creatinine Clearance: 75.2 mL/min (by C-G formula based on SCr of 0.58 mg/dL). Liver Function Tests: Recent Labs  Lab 08/11/21 2316  AST 27  ALT 21  ALKPHOS 103  BILITOT 0.5  PROT 8.5*  ALBUMIN 4.0   Recent Labs  Lab 08/11/21 2316  LIPASE 23   No results for input(s): AMMONIA in the last 168 hours. Coagulation Profile: Recent Labs  Lab 08/11/21 2316  INR 1.1   Cardiac Enzymes: No results for input(s): CKTOTAL, CKMB, CKMBINDEX, TROPONINI in the last 168 hours. BNP (last 3 results) No results for input(s): PROBNP in the last 8760 hours. HbA1C: Recent Labs    08/13/21 0626  HGBA1C 12.8*   CBG: Recent Labs  Lab 08/12/21 1331 08/12/21 1500 08/12/21 1631 08/12/21 2052 08/13/21 0818  GLUCAP 433* 397* 358* 297* 275*   Lipid Profile: No results for input(s): CHOL, HDL, LDLCALC, TRIG, CHOLHDL, LDLDIRECT in the last 72 hours. Thyroid Function Tests: No results for input(s): TSH, T4TOTAL, FREET4, T3FREE, THYROIDAB in the last 72 hours. Anemia Panel: No results for input(s): VITAMINB12, FOLATE, FERRITIN, TIBC, IRON, RETICCTPCT in the last 72  hours. Sepsis Labs: Recent Labs  Lab 08/11/21 2316 08/12/21 0143  LATICACIDVEN 2.2* 1.3    Recent Results (from the past 240 hour(s))  Resp Panel by RT-PCR (Flu A&B, Covid) Nasopharyngeal Swab     Status: None   Collection Time: 08/11/21 11:16 PM   Specimen: Nasopharyngeal Swab; Nasopharyngeal(NP) swabs in vial transport medium  Result Value Ref Range Status   SARS Coronavirus 2 by RT PCR NEGATIVE NEGATIVE Final    Comment: (NOTE) SARS-CoV-2 target nucleic acids are NOT DETECTED.  The SARS-CoV-2 RNA is generally detectable in upper respiratory specimens during the  acute phase of infection. The lowest concentration of SARS-CoV-2 viral copies this assay can detect is 138 copies/mL. A negative result does not preclude SARS-Cov-2 infection and should not be used as the sole basis for treatment or other patient management decisions. A negative result may occur with  improper specimen collection/handling, submission of specimen other than nasopharyngeal swab, presence of viral mutation(s) within the areas targeted by this assay, and inadequate number of viral copies(<138 copies/mL). A negative result must be combined with clinical observations, patient history, and epidemiological information. The expected result is Negative.  Fact Sheet for Patients:  EntrepreneurPulse.com.au  Fact Sheet for Healthcare Providers:  IncredibleEmployment.be  This test is no t yet approved or cleared by the [place] FDA and  has been authorized for detection and/or diagnosis of SARS-CoV-2 by FDA under an Emergency Use Authorization (EUA). This EUA will remain  in effect (meaning this test can be used) for the duration of the COVID-19 declaration under Section 564(b)(1) of the Act, 21 U.S.C.section 360bbb-3(b)(1), unless the authorization is terminated  or revoked sooner.       Influenza A by PCR NEGATIVE NEGATIVE Final   Influenza B by PCR NEGATIVE  NEGATIVE Final    Comment: (NOTE) The Xpert Xpress SARS-CoV-2/FLU/RSV plus assay is intended as an aid in the diagnosis of influenza from Nasopharyngeal swab specimens and should not be used as a sole basis for treatment. Nasal washings and aspirates are unacceptable for Xpert Xpress SARS-CoV-2/FLU/RSV testing.  Fact Sheet for Patients: EntrepreneurPulse.com.au  Fact Sheet for Healthcare Providers: IncredibleEmployment.be  This test is not yet approved or cleared by the [place] FDA and has been authorized for detection and/or diagnosis of SARS-CoV-2 by FDA under an Emergency Use Authorization (EUA). This EUA will remain in effect (meaning this test can be used) for the duration of the COVID-19 declaration under Section 564(b)(1) of the Act, 21 U.S.C. section 360bbb-3(b)(1), unless the authorization is terminated or revoked.  Performed at [facility], [address] [place], [place] [address]   CSF culture     Status: None (Preliminary result)   Collection Time: 08/12/21  1:34 AM   Specimen: Back; Cerebrospinal Fluid  Result Value Ref Range Status   Specimen Description   Final    BACK Performed at [facility], [address] [place], [place] [address]    Special Requests   Final    NONE Performed at [facility], [address] [place], [place] [address]    Gram Stain PENDING  Incomplete   Culture   Final    NO GROWTH 1 DAY Performed at [facility], [place] [address], [place], [place] [address]    Report Status PENDING  Incomplete  Gram stain     Status: None   Collection Time: 08/12/21  1:34 AM   Specimen: Back; Cerebrospinal Fluid  Result Value Ref Range Status   Specimen Description   Final    LUMBAR Performed at [facility], [address] [place], [place] [address]    Special Requests   Final    NONE Performed at [facility], [address] [place], [place] [address]    Gram Stain   Final    NO SQUAMOUS EPITHELIAL CELLS SEEN NO WBC SEEN NO ORGANISMS SEEN Performed at [facility], [place] [address] [place],  [address]    Report Status 08/12/2021 FINAL  Final  Radiology Studies: CT ABDOMEN PELVIS W CONTRAST  Result Date: 08/12/2021 CLINICAL DATA:  Abdominal pain, fever EXAM: CT ABDOMEN AND PELVIS WITH CONTRAST TECHNIQUE: Multidetector CT imaging of the abdomen and pelvis was performed using the standard protocol following bolus administration of intravenous contrast. CONTRAST:  187m OMNIPAQUE IOHEXOL 350 MG/ML SOLN COMPARISON:  07/13/2021 FINDINGS: Lower chest: Lung bases are clear. Hepatobiliary: Liver is within normal limits. Status post cholecystectomy. No intrahepatic or extrahepatic ductal dilatation. Pancreas: Within normal limits. Spleen: Within normal limits. Adrenals/Urinary Tract: Adrenal glands are within normal limits. Multiple punctate nonobstructing left renal calculi measuring up to 2 mm (for example, series 2/image 40). Heterogeneous enhancement of the bilateral kidneys, including the lateral right upper pole (series 2/image 33) and multiple areas within the left kidney (series 2/images 29, 36, 44, and 46), reflecting pyelonephritis. No hydronephrosis. Bladder is within normal limits. Stomach/Bowel: Stomach is within normal limits. No evidence of bowel obstruction. Appendix is not discretely visualized. Vascular/Lymphatic: No evidence of abdominal aortic aneurysm. No suspicious abdominopelvic lymphadenopathy. Reproductive: Uterus is within normal limits. Bilateral ovaries are within normal limits. Other: No abdominopelvic ascites. Musculoskeletal: Visualized osseous structures are within normal limits. IMPRESSION: Acute pyelonephritis, left greater than right. Multiple punctate nonobstructing left renal calculi measuring up to 2 mm. No hydronephrosis. Electronically Signed   By: SJulian HyM.D.   On: 08/12/2021  02:48   DG Chest Portable 1 View  Result Date: 08/11/2021 CLINICAL DATA:  Fever EXAM: PORTABLE CHEST 1 VIEW COMPARISON:  02/15/2020 FINDINGS: The heart size and mediastinal contours are within normal limits. Both lungs are clear. The visualized skeletal structures are unremarkable. IMPRESSION: No active disease. Electronically Signed   By: KDonavan FoilM.D.   On: 08/11/2021 23:49      Scheduled Meds:  amitriptyline  25 mg Oral QHS   aspirin EC  81 mg Oral Daily   buPROPion  300 mg Oral QHS   busPIRone  10 mg Oral Daily   enoxaparin (LOVENOX) injection  40 mg Subcutaneous Q24H   feeding supplement  237 mL Oral BID BM   home med stored in pyxis  1 each Subcutaneous Q2200   insulin aspart  0-15 Units Subcutaneous TID WC   insulin aspart  0-5 Units Subcutaneous QHS   insulin aspart  4 Units Subcutaneous TID WC   insulin glargine  20 Units Subcutaneous QHS   pregabalin  200 mg Oral BID   valACYclovir  500 mg Oral Daily   Continuous Infusions:  sodium chloride 75 mL/hr at 08/12/21 1233   cefTRIAXone (ROCEPHIN)  IV 2 g (08/13/21 0111)     LOS: 1 day      Time spent: 30 minutes   JDessa Phi DO [facility] 08/13/2021, 12:55 PM   Available via Epic secure chat 7am-7pm After these hours, please refer to coverage provider listed on [email or web address]

## 2021-08-14 LAB — CBC
HCT: 34.8 % — ABNORMAL LOW (ref 36.0–46.0)
Hemoglobin: 11.4 g/dL — ABNORMAL LOW (ref 12.0–15.0)
MCH: 29.5 pg (ref 26.0–34.0)
MCHC: 32.8 g/dL (ref 30.0–36.0)
MCV: 89.9 fL (ref 80.0–100.0)
Platelets: 370 10*3/uL (ref 150–400)
RBC: 3.87 MIL/uL (ref 3.87–5.11)
RDW: 12.6 % (ref 11.5–15.5)
WBC: 12.9 10*3/uL — ABNORMAL HIGH (ref 4.0–10.5)
nRBC: 0 % (ref 0.0–0.2)

## 2021-08-14 LAB — GLUCOSE, CAPILLARY
Glucose-Capillary: 236 mg/dL — ABNORMAL HIGH (ref 70–99)
Glucose-Capillary: 226 mg/dL — ABNORMAL HIGH (ref 70–99)
Glucose-Capillary: 209 mg/dL — ABNORMAL HIGH (ref 70–99)

## 2021-08-14 MED ORDER — BASAGLAR KWIKPEN 100 UNIT/ML ~~LOC~~ SOPN: 30.0000 [IU] | PEN_INJECTOR | Freq: Every day | SUBCUTANEOUS | 0 refills | Status: DC

## 2021-08-14 MED ORDER — AMOXICILLIN-POT CLAVULANATE 875-125 MG PO TABS: 1.0000 | ORAL_TABLET | Freq: Two times a day (BID) | ORAL | 0 refills | Status: AC

## 2021-08-14 NOTE — Discharge Summary (Signed)
Physician Discharge Summary  Phyllis Pittman [id number] DOB: 1976/10/02 DOA: 08/11/2021  PCP: Trey Sailors, PA  Admit date: 08/11/2021 Discharge date: 08/14/2021  Admitted From: Home Disposition:  Home  Recommendations for Outpatient Follow-up:  Follow up with PCP in 1 week Outpatient diabetes nutrition education referral placed  Discharge Condition: Stable CODE STATUS: Full  Diet recommendation:  Diet Orders (From admission, onward)     Start     Ordered   08/14/21 0000  Diet Carb Modified        08/14/21 1045   08/12/21 1209  Diet Carb Modified Fluid consistency: Thin; Room service appropriate? Yes  Diet effective now       Question Answer Comment  Diet-HS Snack? Nothing   Calorie Level Medium 1600-2000   Fluid consistency: Thin   Room service appropriate? Yes      08/12/21 1209           Brief/Interim Summary: Phyllis Pittman is a 45 y.o. female with medical history significant of T2DM, HTN, HLD, CVA, anxiety, depression, fibromyalgia, IBS, OSA who presented with fevers and back pain.   Patient reports that she started to have multiple symptoms including intermittent low-grade fever with unclear temperature, sweating, headaches, neck pain, bilateral back pain, urinary pressure-like feeling about 4-5 days ago.  Associate symptoms included nausea, decreased appetite and generalized weakness she went to see her PCP yesterday and was found to have UTI.  She was sent to ED for further evaluation last night.  Abdominal CT suggested acute pyonephritis left greater than right.  Patient was given ceftriaxone 2 g IV and transferred to [facility] long for further management.  Patient's urine culture returned negative.  White blood cell count continue to improve on IV antibiotics.  After discussion with pharmacy, and reviewing her multiple medication allergies/intolerances, antibiotic was changed from Rocephin to Augmentin for discharge.  Discharge Diagnoses:  Principal  Problem:   Sepsis ([place]) Active Problems:   Pyelonephritis   Lactic acidosis   Hyponatremia   Hypokalemia   Hyperglycemia   T2DM (type 2 diabetes mellitus) (HCC)   HTN (hypertension)   HLD (hyperlipidemia)   Anxiety and depression   Sepsis secondary to pyelonephritis -Blood culture negative to date -Urine culture negative -Rocephin --> Augmentin for discharge due to patient's multiple medication intolerances -WBC improving  Diabetes mellitus type 2, uncontrolled with hyperglycemia -Hemoglobin A1c 12.8 -Continue home Lantus, dose increased -Outpatient referral for diabetes nutrition and education placed  Anxiety, depression -Continue Xanax, Wellbutrin, BuSpar, Atarax   Herpes -Continue Valtrex    Discharge Instructions  Discharge Instructions     Ambulatory referral to Nutrition and Diabetic Education   Complete by: As directed    Call MD for:  difficulty breathing, headache or visual disturbances   Complete by: As directed    Call MD for:  extreme fatigue   Complete by: As directed    Call MD for:  hives   Complete by: As directed    Call MD for:  persistant dizziness or light-headedness   Complete by: As directed    Call MD for:  persistant nausea and vomiting   Complete by: As directed    Call MD for:  severe uncontrolled pain   Complete by: As directed    Call MD for:  temperature >100.4   Complete by: As directed    Diet Carb Modified   Complete by: As directed    Discharge instructions   Complete by: As directed    You were cared for  by a hospitalist during your hospital stay. If you have any questions about your discharge medications or the care you received while you were in the hospital after you are discharged, you can call the unit and ask to speak with the hospitalist on call if the hospitalist that took care of you is not available. Once you are discharged, your primary care physician will handle any further medical issues. Please note that NO  REFILLS for any discharge medications will be authorized once you are discharged, as it is imperative that you return to your primary care physician (or establish a relationship with a primary care physician if you do not have one) for your aftercare needs so that they can reassess your need for medications and monitor your lab values.   Increase activity slowly   Complete by: As directed       Allergies as of 08/14/2021       Reactions   Dust Mite Extract Anxiety, Hives, Itching, Rash, Shortness Of Breath, Swelling   Molds & Smuts Anaphylaxis, Anxiety, Hives, Itching, Nausea Only, Rash, Shortness Of Breath, Swelling   Prednisone Other (See Comments)   Other reaction(s): Other (See Comments) Hyperglycemia. Hyperglycemia. Other reaction(s): Other (See Comments) Hyperglycemia. Not the dose pack   Venlafaxine Other (See Comments), Shortness Of Breath   Chest discomfort   Latex Rash   Powder only   Metformin Nausea And Vomiting, Nausea Only, Other (See Comments)   Acyclovir Itching, Nausea Only   Cephalexin Nausea Only   Toletares IV - not pills   Codeine Other (See Comments)   Unknown   Famotidine    Levaquin [levofloxacin] Nausea And Vomiting   Po only can take iv   Semaglutide Other (See Comments)   Made her really sick   Sulfa Antibiotics    Other reaction(s): stomach upset   Trulicity [dulaglutide] Nausea And Vomiting        Medication List     STOP taking these medications    diclofenac 75 MG EC tablet Commonly known as: VOLTAREN   LORazepam 0.5 MG tablet Commonly known as: Ativan   methocarbamol 500 MG tablet Commonly known as: ROBAXIN   SUMAtriptan 50 MG tablet Commonly known as: IMITREX       TAKE these medications    ALPRAZolam 0.5 MG tablet Commonly known as: XANAX Take 1 tablet (0.5 mg total) by mouth at bedtime as needed for anxiety.   amitriptyline 25 MG tablet Commonly known as: ELAVIL Take 25-50 mg by mouth at bedtime as needed for  sleep.   amoxicillin-clavulanate 875-125 MG tablet Commonly known as: Augmentin Take 1 tablet by mouth 2 (two) times daily for 7 days.   aspirin EC 81 MG tablet Take 81 mg by mouth daily. Swallow whole.   Basaglar KwikPen 100 UNIT/ML Inject 30 Units into the skin at bedtime. What changed: how much to take   buPROPion 300 MG 24 hr tablet Commonly known as: WELLBUTRIN XL Take 300 mg by mouth at bedtime.   busPIRone 10 MG tablet Commonly known as: BUSPAR Take 10 mg by mouth daily.   cyclobenzaprine 5 MG tablet Commonly known as: FLEXERIL Take 1 tablet (5 mg total) by mouth 3 (three) times daily as needed for muscle spasms.   etodolac 200 MG capsule Commonly known as: LODINE Take 200 mg by mouth 2 (two) times daily.   fluconazole 150 MG tablet Commonly known as: DIFLUCAN Take 150 mg by mouth daily as needed (itching).   hydrOXYzine 10 MG tablet  Commonly known as: ATARAX/VISTARIL Take 10 mg by mouth 3 (three) times daily as needed for itching or anxiety.   Lidocaine 0.5 % Gel Apply 1 application topically daily as needed (pain).   naproxen 500 MG tablet Commonly known as: NAPROSYN Take 500 mg by mouth daily as needed for mild pain.   NovoLOG 100 UNIT/ML injection Generic drug: insulin aspart Inject 0-10 Units into the skin 3 (three) times daily with meals. Sliding scale   ondansetron 8 MG disintegrating tablet Commonly known as: Zofran ODT Take 1 tablet (8 mg total) by mouth every 8 (eight) hours as needed for nausea or vomiting.   pregabalin 200 MG capsule Commonly known as: LYRICA Take 200 mg by mouth 2 (two) times daily.   QUEtiapine 50 MG tablet Commonly known as: SEROQUEL Take 50 mg by mouth at bedtime.   tiZANidine 4 MG tablet Commonly known as: ZANAFLEX Take 4 mg by mouth every 8 (eight) hours as needed for muscle spasms.   traMADol 50 MG tablet Commonly known as: ULTRAM Take 50 mg by mouth 2 (two) times daily as needed for moderate pain.    valACYclovir 500 MG tablet Commonly known as: VALTREX Take 500 mg by mouth daily.        Follow-up Information     Trey Sailors, [place]. Schedule an appointment as soon as possible for a visit in 1 week(s).   Specialty: Physician Assistant Contact information: [address] [place] [address] [phone]                Allergies  Allergen Reactions   Dust Mite Extract Anxiety, Hives, Itching, Rash, Shortness Of Breath and Swelling   Molds & Smuts Anaphylaxis, Anxiety, Hives, Itching, Nausea Only, Rash, Shortness Of Breath and Swelling   Prednisone Other (See Comments)    Other reaction(s): Other (See Comments) Hyperglycemia. Hyperglycemia. Other reaction(s): Other (See Comments) Hyperglycemia. Not the dose pack    Venlafaxine Other (See Comments) and Shortness Of Breath    Chest discomfort    Latex Rash    Powder only    Metformin Nausea And Vomiting, Nausea Only and Other (See Comments)   Acyclovir Itching and Nausea Only   Cephalexin Nausea Only    Toletares IV - not pills   Codeine Other (See Comments)    Unknown   Famotidine    Levaquin [Levofloxacin] Nausea And Vomiting    Po only can take iv   Semaglutide Other (See Comments)    Made her really sick   Sulfa Antibiotics     Other reaction(s): stomach upset   Trulicity [Dulaglutide] Nausea And Vomiting    Consultations: None    Procedures/Studies: CT ABDOMEN PELVIS W CONTRAST  Result Date: 08/12/2021 CLINICAL DATA:  Abdominal pain, fever EXAM: CT ABDOMEN AND PELVIS WITH CONTRAST TECHNIQUE: Multidetector CT imaging of the abdomen and pelvis was performed using the standard protocol following bolus administration of intravenous contrast. CONTRAST:  150m OMNIPAQUE IOHEXOL 350 MG/ML SOLN COMPARISON:  07/13/2021 FINDINGS: Lower chest: Lung bases are clear. Hepatobiliary: Liver is within normal limits. Status post cholecystectomy. No intrahepatic or extrahepatic ductal dilatation. Pancreas:  Within normal limits. Spleen: Within normal limits. Adrenals/Urinary Tract: Adrenal glands are within normal limits. Multiple punctate nonobstructing left renal calculi measuring up to 2 mm (for example, series 2/image 40). Heterogeneous enhancement of the bilateral kidneys, including the lateral right upper pole (series 2/image 33) and multiple areas within the left kidney (series 2/images 29, 36, 44, and 46), reflecting pyelonephritis. No  hydronephrosis. Bladder is within normal limits. Stomach/Bowel: Stomach is within normal limits. No evidence of bowel obstruction. Appendix is not discretely visualized. Vascular/Lymphatic: No evidence of abdominal aortic aneurysm. No suspicious abdominopelvic lymphadenopathy. Reproductive: Uterus is within normal limits. Bilateral ovaries are within normal limits. Other: No abdominopelvic ascites. Musculoskeletal: Visualized osseous structures are within normal limits. IMPRESSION: Acute pyelonephritis, left greater than right. Multiple punctate nonobstructing left renal calculi measuring up to 2 mm. No hydronephrosis. Electronically Signed   By: Julian Hy M.D.   On: 08/12/2021 02:48   DG Chest Portable 1 View  Result Date: 08/11/2021 CLINICAL DATA:  Fever EXAM: PORTABLE CHEST 1 VIEW COMPARISON:  02/15/2020 FINDINGS: The heart size and mediastinal contours are within normal limits. Both lungs are clear. The visualized skeletal structures are unremarkable. IMPRESSION: No active disease. Electronically Signed   By: Donavan Foil M.D.   On: 08/11/2021 23:49       Discharge Exam: Vitals:   08/13/21 2110 08/14/21 0454  BP: 128/88 95/67  Pulse: 91 88  Resp: 16 16  Temp: 98.3 F (36.8 C) 98.3 F (36.8 C)  SpO2: 97% 94%    General: Pt is alert, awake, not in acute distress Cardiovascular: RRR, S1/S2 +, no edema Respiratory: CTA bilaterally, no wheezing, no rhonchi, no respiratory distress, no conversational dyspnea  Abdominal: Soft, NT, ND, bowel sounds  + Extremities: no edema, no cyanosis Psych: Normal mood and affect, stable judgement and insight     The results of significant diagnostics from this hospitalization (including imaging, microbiology, ancillary and laboratory) are listed below for reference.     Microbiology: Recent Results (from the past 240 hour(s))  Culture, blood (Routine x 2)     Status: None (Preliminary result)   Collection Time: 08/11/21 11:15 PM   Specimen: Left Antecubital; Blood  Result Value Ref Range Status   Specimen Description   Final    LEFT ANTECUBITAL Performed at [facility], [address] [place], [place] [address]    Special Requests   Final    BOTTLES DRAWN AEROBIC AND ANAEROBIC Blood Culture adequate volume Performed at [facility], [address] [place], [place] [address]    Culture   Final    NO GROWTH 2 DAYS Performed at [facility], [place] [address] [place], [place] [address]    Report Status PENDING  Incomplete  Resp Panel by RT-PCR (Flu A&B, Covid) Nasopharyngeal Swab     Status: None   Collection Time: 08/11/21 11:16 PM   Specimen: Nasopharyngeal Swab; Nasopharyngeal(NP) swabs in vial transport medium  Result Value Ref Range Status   SARS Coronavirus 2 by RT PCR NEGATIVE NEGATIVE Final    Comment: (NOTE) SARS-CoV-2 target nucleic acids are NOT DETECTED.  The SARS-CoV-2 RNA is generally detectable in upper respiratory specimens during the acute phase of infection. The lowest concentration of SARS-CoV-2 viral copies this assay can detect is 138 copies/mL. A negative result does not preclude SARS-Cov-2 infection and should not be used as the sole basis for treatment or other patient management decisions. A negative result may occur with  improper specimen collection/handling, submission of specimen other than nasopharyngeal swab, presence of viral mutation(s) within the areas targeted by this assay, and inadequate number of viral copies(<138  copies/mL). A negative result must be combined with clinical observations, patient history, and epidemiological information. The expected result is Negative.  Fact Sheet for Patients:  EntrepreneurPulse.com.au  Fact Sheet for Healthcare Providers:  IncredibleEmployment.be  This test is no t yet approved or cleared by the [place] and  has been authorized for detection and/or diagnosis of SARS-CoV-2 by FDA under an Emergency Use Authorization (EUA). This EUA will remain  in effect (meaning this test can be used) for the duration of the COVID-19 declaration under Section 564(b)(1) of the Act, 21 U.S.C.section 360bbb-3(b)(1), unless the authorization is terminated  or revoked sooner.       Influenza A by PCR NEGATIVE NEGATIVE Final   Influenza B by PCR NEGATIVE NEGATIVE Final    Comment: (NOTE) The Xpert Xpress SARS-CoV-2/FLU/RSV plus assay is intended as an aid in the diagnosis of influenza from Nasopharyngeal swab specimens and should not be used as a sole basis for treatment. Nasal washings and aspirates are unacceptable for Xpert Xpress SARS-CoV-2/FLU/RSV testing.  Fact Sheet for Patients: EntrepreneurPulse.com.au  Fact Sheet for Healthcare Providers: IncredibleEmployment.be  This test is not yet approved or cleared by the [place] FDA and has been authorized for detection and/or diagnosis of SARS-CoV-2 by FDA under an Emergency Use Authorization (EUA). This EUA will remain in effect (meaning this test can be used) for the duration of the COVID-19 declaration under Section 564(b)(1) of the Act, 21 U.S.C. section 360bbb-3(b)(1), unless the authorization is terminated or revoked.  Performed at [facility], [address] [place], [place] [address]   Culture, blood (Routine x 2)     Status: None (Preliminary result)   Collection Time: 08/12/21 12:15 AM   Specimen: BLOOD RIGHT  FOREARM  Result Value Ref Range Status   Specimen Description   Final    BLOOD RIGHT FOREARM Performed at [facility], [address] [place], [place] [address]    Special Requests   Final    BOTTLES DRAWN AEROBIC AND ANAEROBIC Blood Culture adequate volume Performed at [facility] [address] [place], [place] [address]    Culture   Final    NO GROWTH 2 DAYS Performed at [facility], [place] [address] [place], [place] [address]    Report Status PENDING  Incomplete  CSF culture     Status: None (Preliminary result)   Collection Time: 08/12/21  1:34 AM   Specimen: Back; Cerebrospinal Fluid  Result Value Ref Range Status   Specimen Description   Final    BACK Performed at [facility], [address] [place], [place] [address]    Special Requests   Final    NONE Performed at [facility], [address] [place], [place] [address]    Gram Stain NO WBC SEEN NO ORGANISMS SEEN CYTOSPIN SMEAR   Final   Culture   Final    NO GROWTH 2 DAYS Performed at [facility], [place] [address], [place], [place] [address]    Report Status PENDING  Incomplete  Gram stain     Status: None   Collection Time: 08/12/21  1:34 AM   Specimen: Back; Cerebrospinal Fluid  Result Value Ref Range Status   Specimen Description   Final    LUMBAR Performed at [facility], [address] [place], [place] [address]    Special Requests   Final    NONE Performed at [facility], [address] [place], [place] [address]    Gram Stain   Final    NO SQUAMOUS EPITHELIAL CELLS SEEN NO WBC SEEN NO ORGANISMS  SEEN Performed at [facility], [place] [address], [place], [place] [address]    Report Status 08/12/2021 FINAL  Final  Urine Culture     Status: None   Collection Time: 08/12/21  1:35 PM   Specimen: Urine, Clean Catch  Result Value Ref Range Status   Specimen Description   Final    URINE, CLEAN  CATCH Performed at [facility], [place] [address] [place], [place] [address]    Special Requests   Final    NONE Performed at [facility], [place] [address] [place], [place] [address]    Culture   Final    NO GROWTH Performed at [facility], [place] [address], [place], [place] [address]    Report Status 08/13/2021 FINAL  Final     Labs: BNP (last 3 results) No results for input(s): BNP in the last 8760 hours. Basic Metabolic Panel: Recent Labs  Lab 08/11/21 2316 08/13/21 0626  NA 133* 135  K 3.1* 3.8  CL 95* 106  CO2 26 19*  GLUCOSE 335* 344*  BUN 9 12  CREATININE 0.78 0.58  CALCIUM 9.2 8.7*  MG  --  2.0   Liver Function Tests: Recent Labs  Lab 08/11/21 2316  AST 27  ALT 21  ALKPHOS 103  BILITOT 0.5  PROT 8.5*  ALBUMIN 4.0   Recent Labs  Lab 08/11/21 2316  LIPASE 23   No results for input(s): AMMONIA in the last 168 hours. CBC: Recent Labs  Lab 08/11/21 2316 08/13/21 0626 08/14/21 0514  WBC 15.1* 19.6* 12.9*  NEUTROABS 12.8*  --   --   HGB 15.2* 11.3* 11.4*  HCT 43.4 33.8* 34.8*  MCV 86.6 89.2 89.9  PLT 364 345 370   Cardiac Enzymes: No results for input(s): CKTOTAL, CKMB, CKMBINDEX, TROPONINI in the last 168 hours. BNP: Invalid input(s): POCBNP CBG: Recent Labs  Lab 08/13/21 0818 08/13/21 1244 08/13/21 1705 08/13/21 2108 08/14/21 0754  GLUCAP 275* 244* 297* 394* 236*   D-Dimer No results for input(s): DDIMER in the last 72 hours. Hgb A1c Recent Labs    08/13/21 0626  HGBA1C 12.8*   Lipid Profile No results for input(s): CHOL, HDL, LDLCALC, TRIG, CHOLHDL, LDLDIRECT in the last 72 hours. Thyroid function studies No results for input(s): TSH, T4TOTAL, T3FREE, THYROIDAB in the last 72 hours.  Invalid input(s): FREET3 Anemia work up No results for input(s): VITAMINB12, FOLATE, FERRITIN, TIBC, IRON, RETICCTPCT in the last 72 hours. Urinalysis    Component Value Date/Time   COLORURINE  YELLOW 08/12/2021 0253   APPEARANCEUR CLOUDY (A) 08/12/2021 0253   LABSPEC 1.015 08/12/2021 0253   PHURINE 6.0 08/12/2021 0253   GLUCOSEU >=500 (A) 08/12/2021 0253   HGBUR LARGE (A) 08/12/2021 0253   BILIRUBINUR LARGE (A) 08/12/2021 0253   KETONESUR 40 (A) 08/12/2021 0253   PROTEINUR 30 (A) 08/12/2021 0253   NITRITE NEGATIVE 08/12/2021 0253   LEUKOCYTESUR SMALL (A) 08/12/2021 0253   Sepsis Labs Invalid input(s): PROCALCITONIN,  WBC,  LACTICIDVEN Microbiology Recent Results (from the past 240 hour(s))  Culture, blood (Routine x 2)     Status: None (Preliminary result)   Collection Time: 08/11/21 11:15 PM   Specimen: Left Antecubital; Blood  Result Value Ref Range Status   Specimen Description   Final    LEFT ANTECUBITAL Performed at [facility], [address] [place], [place] [address]    Special Requests   Final    BOTTLES DRAWN  AEROBIC AND ANAEROBIC Blood Culture adequate volume Performed at [facility], [address] [place], [place] [address]    Culture   Final    NO GROWTH 2 DAYS Performed at [facility], [place] [address], [place], [place] [address]    Report Status PENDING  Incomplete  Resp Panel by RT-PCR (Flu A&B, Covid) Nasopharyngeal Swab     Status: None   Collection Time: 08/11/21 11:16 PM   Specimen: Nasopharyngeal Swab; Nasopharyngeal(NP) swabs in vial transport medium  Result Value Ref Range Status   SARS Coronavirus 2 by RT PCR NEGATIVE NEGATIVE Final    Comment: (NOTE) SARS-CoV-2 target nucleic acids are NOT DETECTED.  The SARS-CoV-2 RNA is generally detectable in upper respiratory specimens during the acute phase of infection. The lowest concentration of SARS-CoV-2 viral copies this assay can detect is 138 copies/mL. A negative result does not preclude SARS-Cov-2 infection and should not be used as the sole basis for treatment or other patient management decisions. A negative result may occur with  improper specimen  collection/handling, submission of specimen other than nasopharyngeal swab, presence of viral mutation(s) within the areas targeted by this assay, and inadequate number of viral copies(<138 copies/mL). A negative result must be combined with clinical observations, patient history, and epidemiological information. The expected result is Negative.  Fact Sheet for Patients:  EntrepreneurPulse.com.au  Fact Sheet for Healthcare Providers:  IncredibleEmployment.be  This test is no t yet approved or cleared by the [place] FDA and  has been authorized for detection and/or diagnosis of SARS-CoV-2 by FDA under an Emergency Use Authorization (EUA). This EUA will remain  in effect (meaning this test can be used) for the duration of the COVID-19 declaration under Section 564(b)(1) of the Act, 21 U.S.C.section 360bbb-3(b)(1), unless the authorization is terminated  or revoked sooner.       Influenza A by PCR NEGATIVE NEGATIVE Final   Influenza B by PCR NEGATIVE NEGATIVE Final    Comment: (NOTE) The Xpert Xpress SARS-CoV-2/FLU/RSV plus assay is intended as an aid in the diagnosis of influenza from Nasopharyngeal swab specimens and should not be used as a sole basis for treatment. Nasal washings and aspirates are unacceptable for Xpert Xpress SARS-CoV-2/FLU/RSV testing.  Fact Sheet for Patients: EntrepreneurPulse.com.au  Fact Sheet for Healthcare Providers: IncredibleEmployment.be  This test is not yet approved or cleared by the [place] FDA and has been authorized for detection and/or diagnosis of SARS-CoV-2 by FDA under an Emergency Use Authorization (EUA). This EUA will remain in effect (meaning this test can be used) for the duration of the COVID-19 declaration under Section 564(b)(1) of the Act, 21 U.S.C. section 360bbb-3(b)(1), unless the authorization is terminated or revoked.  Performed at [facility], [address] [place], [place] [address]   Culture, blood (Routine x 2)     Status: None (Preliminary result)   Collection Time: 08/12/21 12:15 AM   Specimen: BLOOD RIGHT FOREARM  Result Value Ref Range Status   Specimen Description   Final    BLOOD RIGHT FOREARM Performed at [facility], [address] [place], [place] [address]    Special Requests   Final    BOTTLES DRAWN AEROBIC AND ANAEROBIC Blood Culture adequate volume Performed at [facility], [address] [place], [place] [address]    Culture   Final    NO GROWTH 2 DAYS Performed at [facility], [place]  [address], [place], [place] [address]    Report Status PENDING  Incomplete  CSF culture     Status: None (Preliminary result)   Collection Time: 08/12/21  1:34 AM   Specimen: Back; Cerebrospinal Fluid  Result Value Ref Range Status   Specimen Description   Final    BACK Performed at [facility], [address] [place], [place] [address]    Special Requests   Final    NONE Performed at [facility], [address] [place], [place] [address]    Gram Stain NO WBC SEEN NO ORGANISMS SEEN CYTOSPIN SMEAR   Final   Culture   Final    NO GROWTH 2 DAYS Performed at [facility], [place] [address] [place], [place] [address]    Report Status PENDING  Incomplete  Gram stain     Status: None   Collection Time: 08/12/21  1:34 AM   Specimen: Back; Cerebrospinal Fluid  Result Value Ref Range Status   Specimen Description   Final    LUMBAR Performed at [facility], [place] [place], [place] [address]    Special Requests   Final    NONE Performed at [facility], [address] [place], [place] [address]    Gram Stain   Final    NO SQUAMOUS EPITHELIAL CELLS SEEN NO WBC SEEN NO ORGANISMS SEEN Performed at [facility], [place] [address] [place], [place] [address]    Report Status 08/12/2021 FINAL  Final   Urine Culture     Status: None   Collection Time: 08/12/21  1:35 PM   Specimen: Urine, Clean Catch  Result Value Ref Range Status   Specimen Description   Final    URINE, CLEAN CATCH Performed at [facility], [place] [address], [place], [place] [address]    Special Requests   Final    NONE Performed at [facility], [place] [address], [place],  [address]    Culture   Final    NO GROWTH Performed at [facility], [place] [address] [place],  [address]    Report Status 08/13/2021 FINAL  Final     Patient was seen and examined on the day of discharge and was found to be in stable condition. Time coordinating discharge: 20 minutes including assessment and coordination of care, as well as examination of the patient.   SIGNED:  Dessa Phi, DO [facility] 08/14/2021, 10:46 AM

## 2021-08-14 NOTE — Progress Notes (Signed)
Mobility Specialist - Cancellation Note  Cancellation/Refusal: Pt stated feeling "too wobbly" and increased pain levels as she waits for d/c.   [place] Specialist Acute Rehabilitation Services Phone: [phone] 08/14/21, 3:31 PM

## 2021-08-14 NOTE — Progress Notes (Signed)
Inpatient Diabetes Program Recommendations  AACE/ADA: New Consensus Statement on Inpatient Glycemic Control (2015)  Target Ranges:  Prepandial:   less than 140 mg/dL      Peak postprandial:   less than 180 mg/dL (1-2 hours)      Critically ill patients:  140 - 180 mg/dL   Lab Results  Component Value Date   GLUCAP 209 (H) 08/14/2021   HGBA1C 12.8 (H) 08/13/2021    Review of Glycemic Control  Diabetes history: DM2 Outpatient Diabetes medications: Basaglar 20 units QHS, Novolog 0-10 units TID Current orders for Inpatient glycemic control: Lantus 20 QHS, Novolog 0-15 units TID with meals and 0-5 HS + 4 units TID  HgbA1C - 12.8%  Inpatient Diabetes Program Recommendations:    Spoke with patient about her diabetes control at home.  Discussed A1C results (12.8%)  and informed patient that her current A1C indicates an average glucose of 321 mg/dl over the past 2-3 months. Discussed basic pathophysiology of DM Type 2, basic home care, importance of checking CBGs and maintaining good CBG control to prevent long-term and short-term complications. Reviewed glucose and A1C goals. Reviewed signs and symptoms of hyperglycemia and hypoglycemia along with treatment for both. Discussed impact of nutrition, exercise, stress, sickness, and medications on diabetes control. Pt seems to be in denial about her glucose control. Does not always take insulin as prescribed. States she has lost a significant amount of weight in the past month or two. Stressed importance of getting a PCP that she has feels comfortable with. Needs to monitor blood sugars 3-4x/day and take logbook to MD visit. Stressed importance of glucose control to prevent long and short-term complications.   Thank you. Lorenda Peck, RD, LDN, CDE Inpatient Diabetes Coordinator [phone]

## 2021-08-15 LAB — CSF CULTURE W GRAM STAIN
Culture: NO GROWTH
Gram Stain: NONE SEEN

## 2021-08-15 LAB — HSV 1/2 PCR, CSF
HSV-1 DNA: NEGATIVE
HSV-2 DNA: NEGATIVE

## 2021-08-15 LAB — HSV CULTURE AND TYPING

## 2021-08-17 LAB — CULTURE, BLOOD (ROUTINE X 2)
Culture: NO GROWTH
Culture: NO GROWTH
Special Requests: ADEQUATE
Special Requests: ADEQUATE

## 2021-08-19 LAB — VZV PCR, CSF: VZV PCR, CSF: NEGATIVE

## 2021-08-22 ENCOUNTER — Other Ambulatory Visit: Payer: Self-pay | Admitting: Family

## 2021-08-22 ENCOUNTER — Other Ambulatory Visit: Payer: Medicare Other

## 2021-08-22 ENCOUNTER — Ambulatory Visit: Payer: Medicare Other | Admitting: Family

## 2021-08-22 DIAGNOSIS — D751 Secondary polycythemia: Secondary | ICD-10-CM

## 2021-08-22 DIAGNOSIS — D509 Iron deficiency anemia, unspecified: Secondary | ICD-10-CM

## 2021-08-22 DIAGNOSIS — D75839 Thrombocytosis, unspecified: Secondary | ICD-10-CM

## 2021-08-22 DIAGNOSIS — D72829 Elevated white blood cell count, unspecified: Secondary | ICD-10-CM

## 2021-09-22 DIAGNOSIS — E1165 Type 2 diabetes mellitus with hyperglycemia: Secondary | ICD-10-CM | POA: Diagnosis not present

## 2021-09-26 ENCOUNTER — Other Ambulatory Visit: Payer: Self-pay | Admitting: Family

## 2021-09-26 DIAGNOSIS — D72829 Elevated white blood cell count, unspecified: Secondary | ICD-10-CM

## 2021-09-26 DIAGNOSIS — D75839 Thrombocytosis, unspecified: Secondary | ICD-10-CM

## 2021-09-27 ENCOUNTER — Inpatient Hospital Stay: Payer: No Typology Code available for payment source | Admitting: Family

## 2021-09-27 ENCOUNTER — Inpatient Hospital Stay: Payer: No Typology Code available for payment source | Attending: Hematology & Oncology

## 2021-11-23 DIAGNOSIS — Z20822 Contact with and (suspected) exposure to covid-19: Secondary | ICD-10-CM | POA: Diagnosis not present

## 2021-11-24 DIAGNOSIS — E1165 Type 2 diabetes mellitus with hyperglycemia: Secondary | ICD-10-CM | POA: Diagnosis not present

## 2021-11-24 DIAGNOSIS — R202 Paresthesia of skin: Secondary | ICD-10-CM | POA: Diagnosis not present

## 2021-11-24 DIAGNOSIS — G43111 Migraine with aura, intractable, with status migrainosus: Secondary | ICD-10-CM | POA: Diagnosis not present

## 2021-12-15 DIAGNOSIS — M7742 Metatarsalgia, left foot: Secondary | ICD-10-CM | POA: Diagnosis not present

## 2022-02-22 DIAGNOSIS — R110 Nausea: Secondary | ICD-10-CM | POA: Diagnosis not present

## 2022-04-27 DIAGNOSIS — K2950 Unspecified chronic gastritis without bleeding: Secondary | ICD-10-CM | POA: Diagnosis not present

## 2022-04-27 DIAGNOSIS — R197 Diarrhea, unspecified: Secondary | ICD-10-CM | POA: Diagnosis not present
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
## 2022-07-31 IMAGING — DX DG CHEST 1V PORT
1 series · 1 of 1 positions shown · non-contrast
Comparison: 02/15/2020

CLINICAL DATA: Fever

EXAM:
PORTABLE CHEST 1 VIEW

[chest ap]
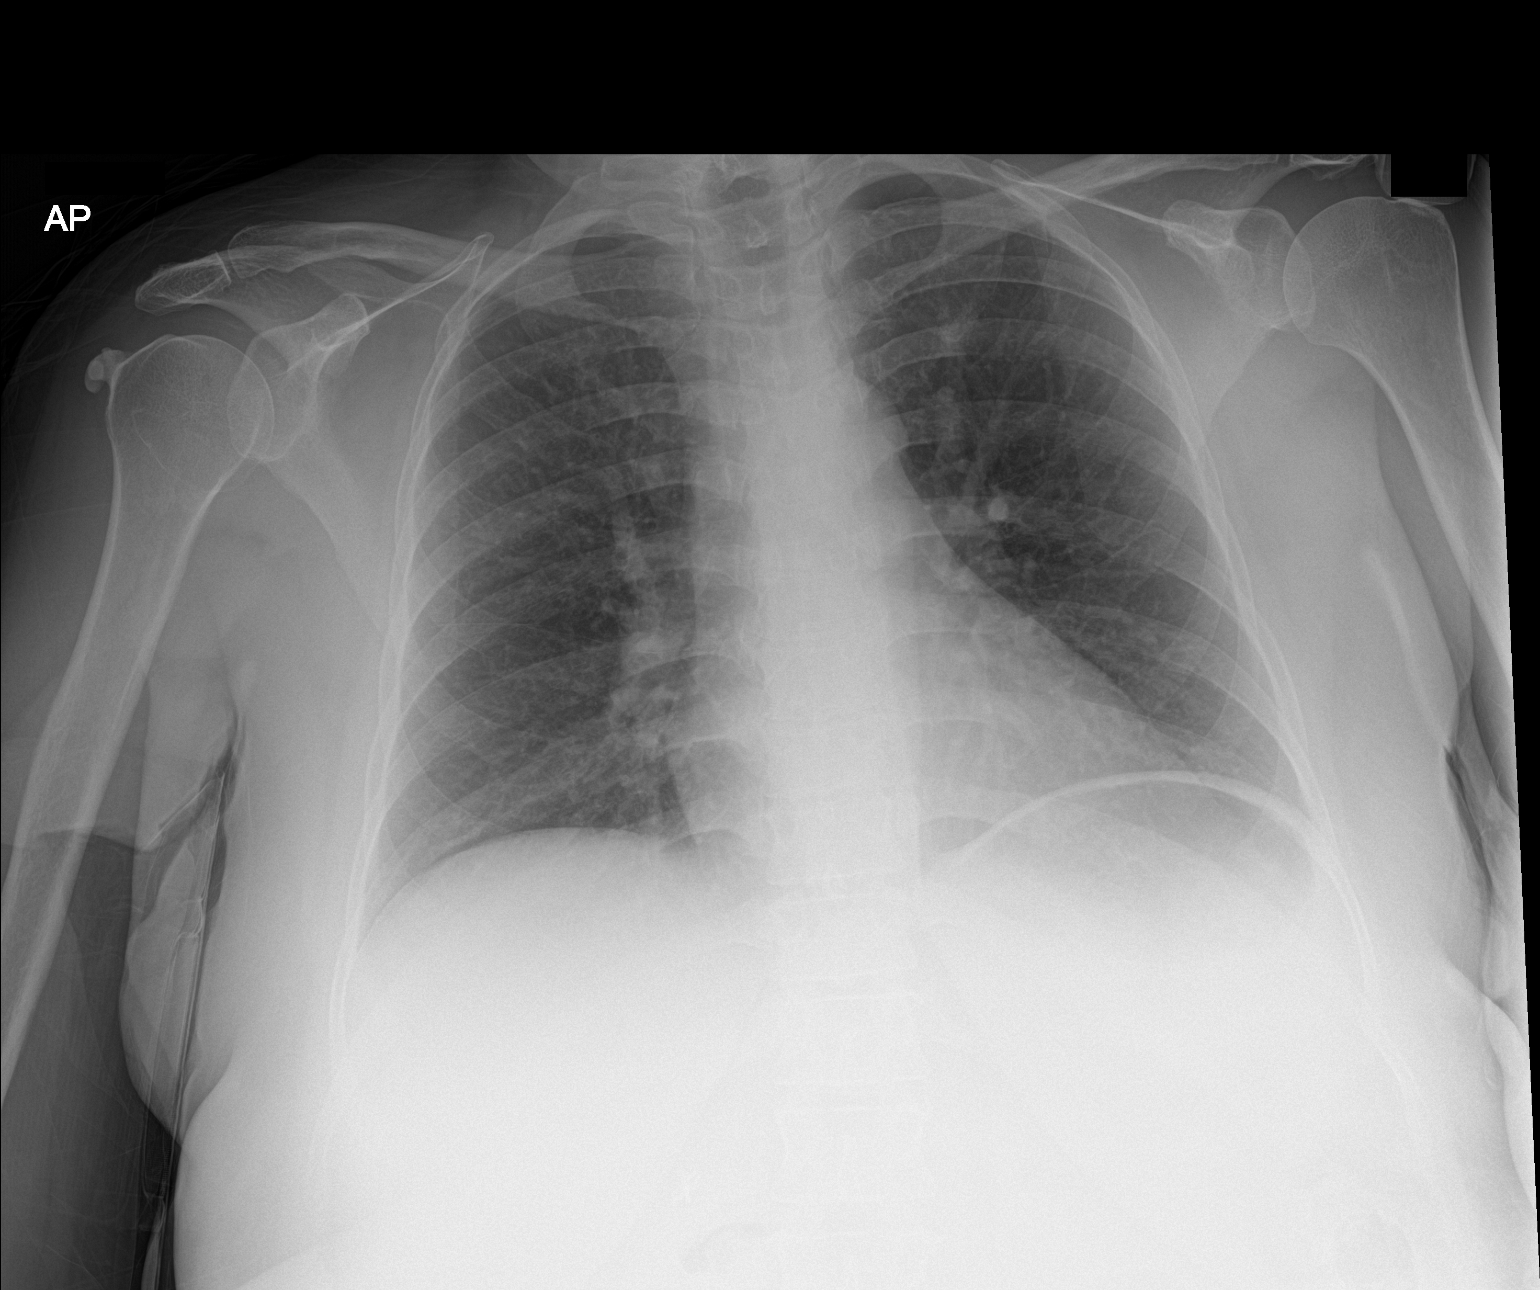

[1 of 1 positions shown; findings below may reference images not displayed]

FINDINGS: The heart size and mediastinal contours are within normal limits.
Both lungs are clear. The visualized skeletal structures are
unremarkable.
IMPRESSION: No active disease.

## 2022-08-01 IMAGING — CT CT ABD-PELV W/ CM
2 of 5 series · 16 of 46 positions shown, 18 images · IV contrast (omnipaque)
Comparison: 07/13/2021

CLINICAL DATA: Abdominal pain, fever

EXAM:
CT ABDOMEN AND PELVIS WITH CONTRAST
TECHNIQUE: Multidetector CT imaging of the abdomen and pelvis was performed
using the standard protocol following bolus administration of
intravenous contrast.
CONTRAST:  100mL OMNIPAQUE IOHEXOL 350 MG/ML SOLN

[Series 2: axial st · axial · 0.90mm/px · z∈[+756,+1166]mm · 13 of 96 slices shown, 15 images]
[im 7/96  soft-tissue]
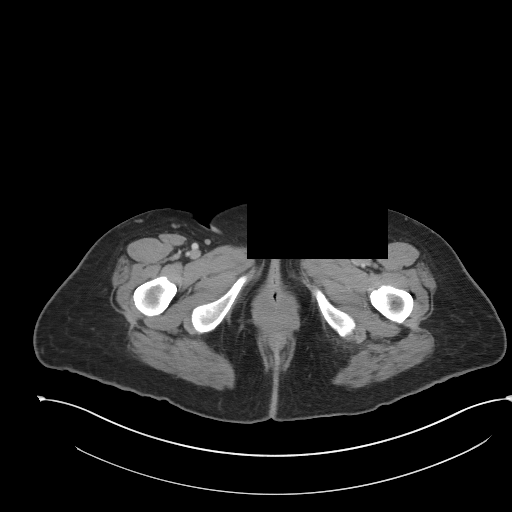
[im 7/96  bone]
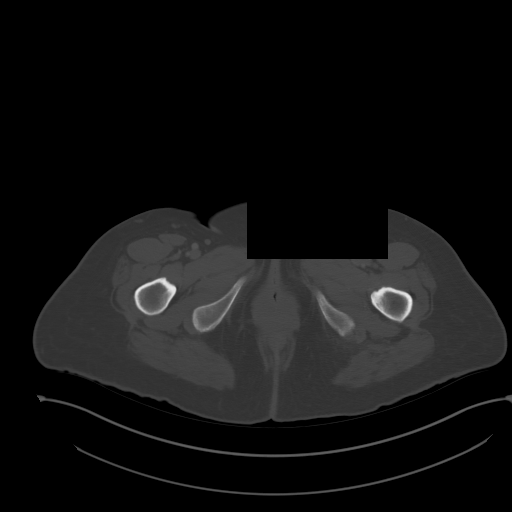
[im 13/96  soft-tissue]
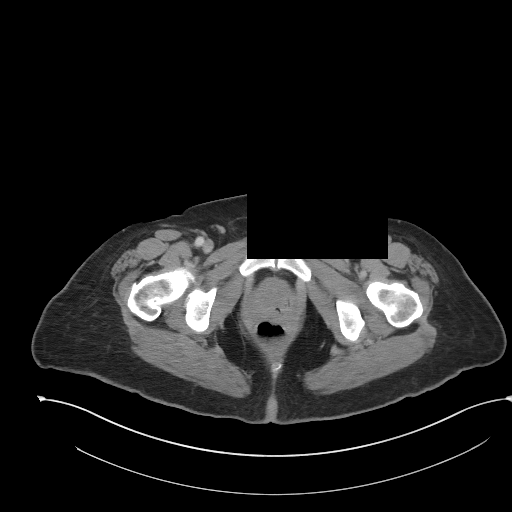
[im 20/96  soft-tissue]
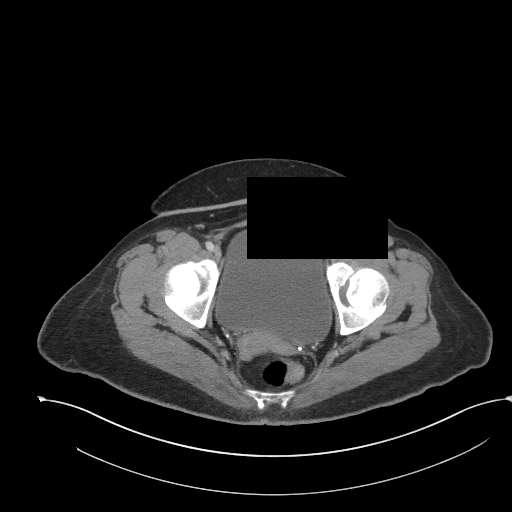
[im 26/96  soft-tissue]
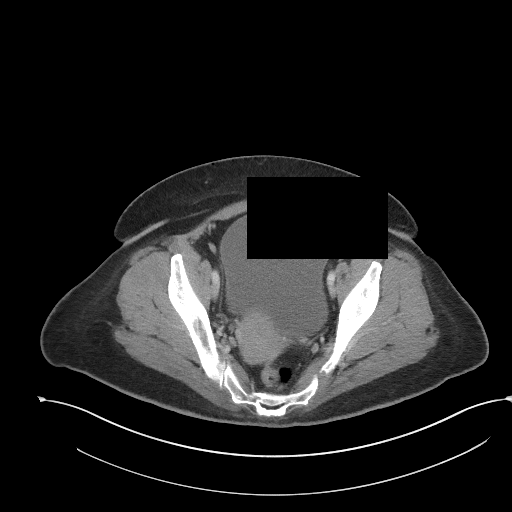
[im 32/96  soft-tissue]
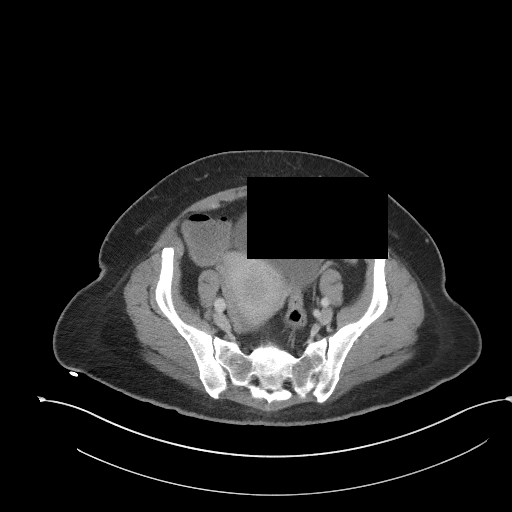
[im 39/96  soft-tissue]
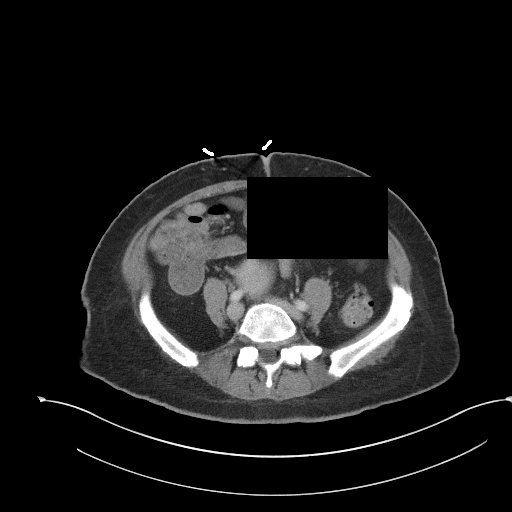
[im 51/96  soft-tissue]
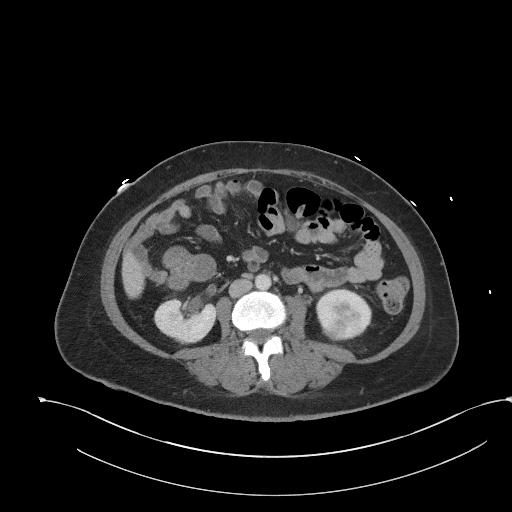
[im 58/96  soft-tissue]
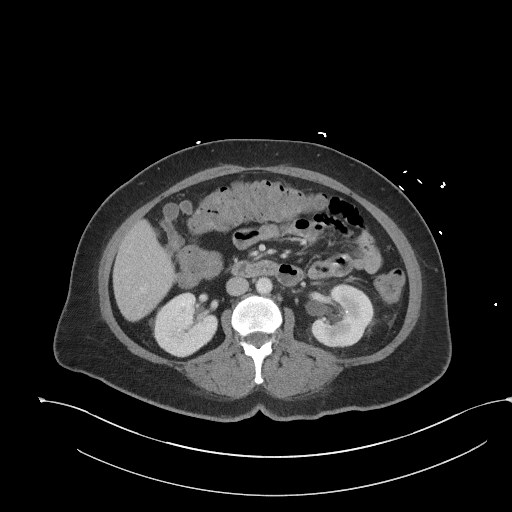
[im 64/96  soft-tissue]
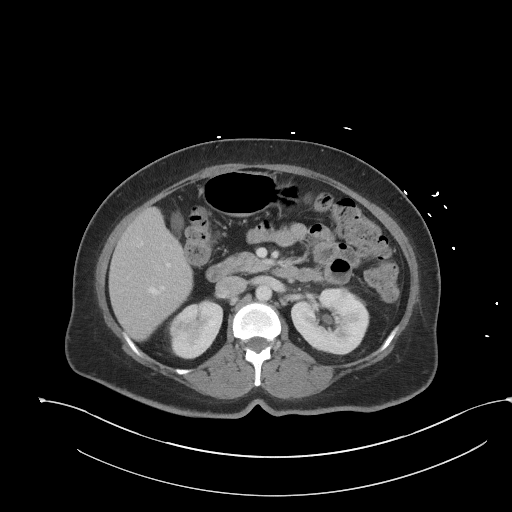
[im 64/96  bone]
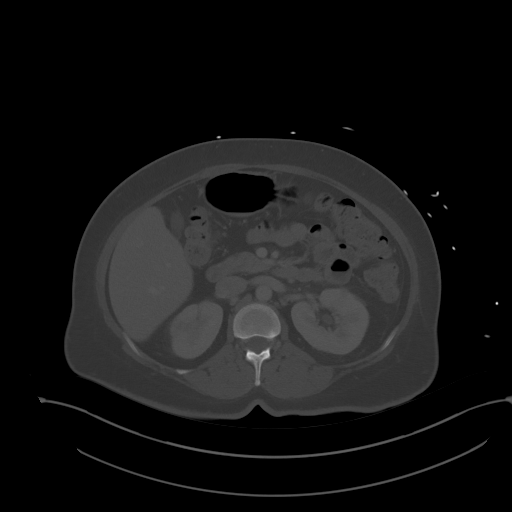
[im 70/96  soft-tissue]
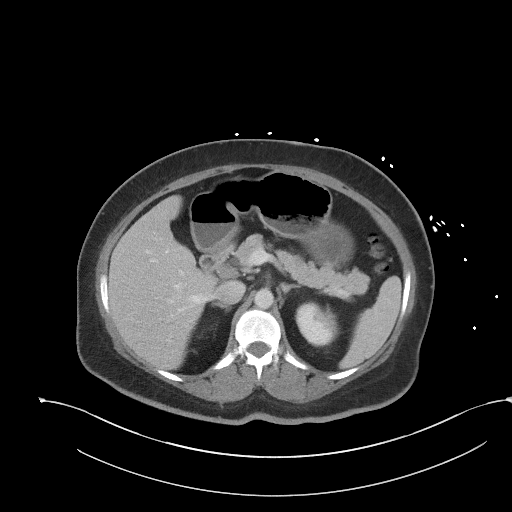
[im 77/96  soft-tissue]
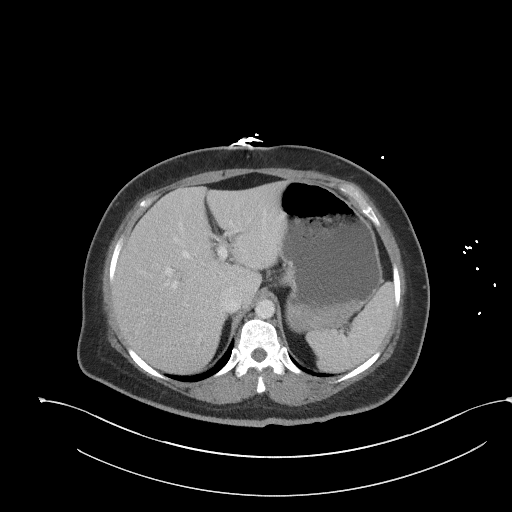
[im 83/96  soft-tissue]
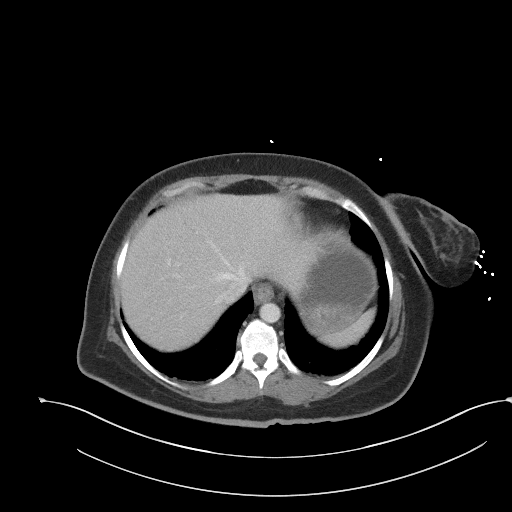
[im 89/96  soft-tissue]
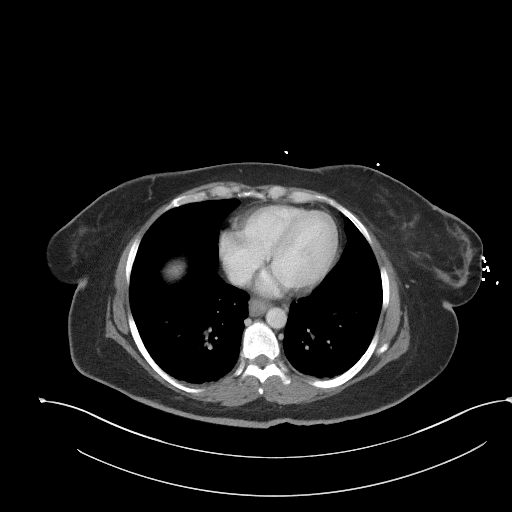

[Series 5: coronal st · coronal · 0.71mm/px · 3 of 101 slices shown]
[im 34/101  soft-tissue]
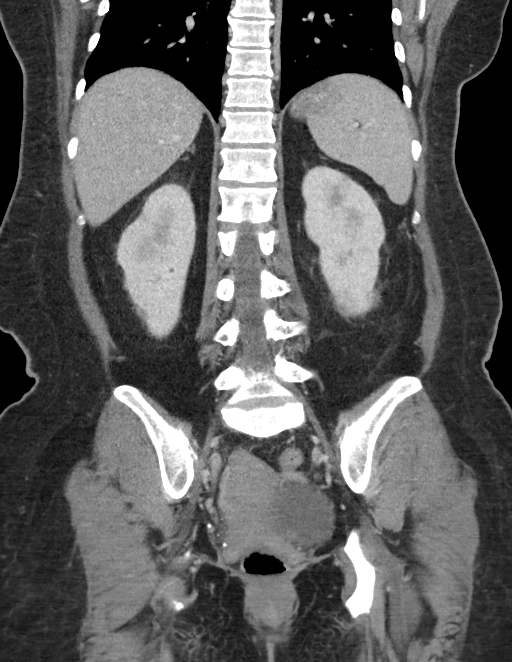
[im 45/101  soft-tissue]
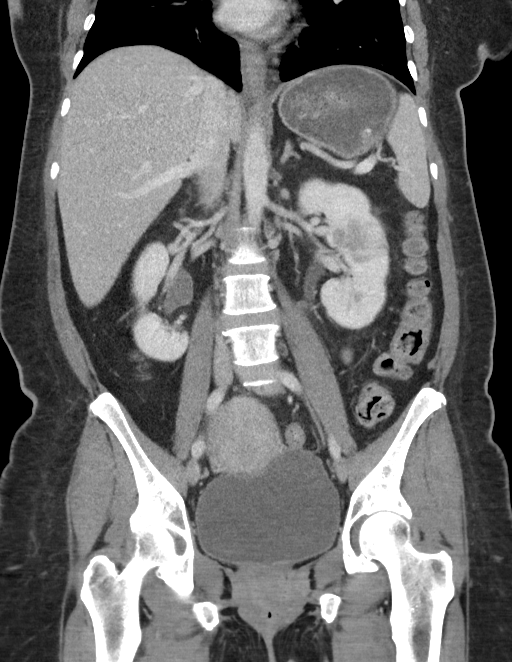
[im 56/101  soft-tissue]
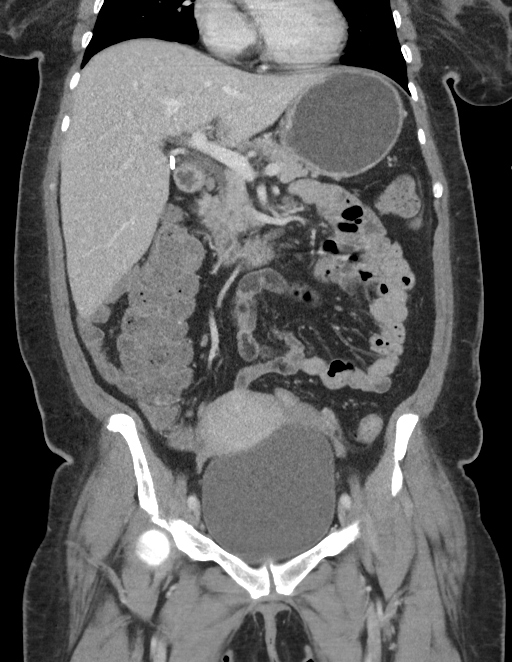

[16 of 46 positions shown; findings below may reference images not displayed]

FINDINGS: Lower chest: Lung bases are clear.

Hepatobiliary: Liver is within normal limits.

Status post cholecystectomy. No intrahepatic or extrahepatic ductal
dilatation.

Pancreas: Within normal limits.

Spleen: Within normal limits.

Adrenals/Urinary Tract: Adrenal glands are within normal limits.

Multiple punctate nonobstructing left renal calculi measuring up to
2 mm (for example, series 2/image 40).

Heterogeneous enhancement of the bilateral kidneys, including the
lateral right upper pole (series 2/image 33) and multiple areas
within the left kidney (series 2/images 29, 36, 44, and 46),
reflecting pyelonephritis.

No hydronephrosis.

Bladder is within normal limits.

Stomach/Bowel: Stomach is within normal limits.

No evidence of bowel obstruction.

Appendix is not discretely visualized.

Vascular/Lymphatic: No evidence of abdominal aortic aneurysm.

No suspicious abdominopelvic lymphadenopathy.

Reproductive: Uterus is within normal limits.

Bilateral ovaries are within normal limits.

Other: No abdominopelvic ascites.

Musculoskeletal: Visualized osseous structures are within normal
limits.
IMPRESSION: Acute pyelonephritis, left greater than right.

Multiple punctate nonobstructing left renal calculi measuring up to
2 mm. No hydronephrosis.

## 2022-08-15 ENCOUNTER — Emergency Department (HOSPITAL_COMMUNITY): Payer: Medicare Other

## 2022-08-15 ENCOUNTER — Encounter (HOSPITAL_COMMUNITY): Payer: Self-pay | Admitting: Emergency Medicine

## 2022-08-15 ENCOUNTER — Other Ambulatory Visit: Payer: Self-pay

## 2022-08-15 ENCOUNTER — Emergency Department (HOSPITAL_COMMUNITY)
Admission: EM | Admit: 2022-08-15 | Discharge: 2022-08-15 | Disposition: A | Discharge status: Home / Self Care | Payer: Medicare Other | Attending: Emergency Medicine | Admitting: Emergency Medicine

## 2022-08-15 DIAGNOSIS — R519 Headache, unspecified: Secondary | ICD-10-CM | POA: Diagnosis not present

## 2022-08-15 DIAGNOSIS — Z91040 Latex allergy status: Secondary | ICD-10-CM | POA: Insufficient documentation

## 2022-08-15 DIAGNOSIS — Z79899 Other long term (current) drug therapy: Secondary | ICD-10-CM | POA: Diagnosis not present

## 2022-08-15 DIAGNOSIS — R000 Tachycardia, unspecified: Secondary | ICD-10-CM | POA: Diagnosis not present

## 2022-08-15 DIAGNOSIS — Z7982 Long term (current) use of aspirin: Secondary | ICD-10-CM | POA: Diagnosis not present

## 2022-08-15 DIAGNOSIS — M25552 Pain in left hip: Secondary | ICD-10-CM | POA: Diagnosis not present

## 2022-08-15 DIAGNOSIS — M542 Cervicalgia: Secondary | ICD-10-CM | POA: Diagnosis not present

## 2022-08-15 DIAGNOSIS — Z794 Long term (current) use of insulin: Secondary | ICD-10-CM | POA: Insufficient documentation

## 2022-08-15 DIAGNOSIS — Y92410 Unspecified street and highway as the place of occurrence of the external cause: Secondary | ICD-10-CM | POA: Insufficient documentation

## 2022-08-15 DIAGNOSIS — M545 Low back pain, unspecified: Secondary | ICD-10-CM | POA: Insufficient documentation

## 2022-08-15 MED ORDER — KETOROLAC TROMETHAMINE 30 MG/ML IJ SOLN
30.0000 mg | Freq: Once | INTRAMUSCULAR | Status: DC
  Filled 2022-08-15: qty 1

## 2022-08-15 MED ORDER — HYDROXYZINE HCL 25 MG PO TABS: 25.0000 mg | ORAL_TABLET | Freq: Once | ORAL | Status: DC

## 2022-08-15 MED ORDER — IBUPROFEN 800 MG PO TABS
800.0000 mg | ORAL_TABLET | Freq: Once | ORAL | Status: AC
  Administered 2022-08-15: 800 mg via ORAL
  Filled 2022-08-15: qty 1
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
## 2022-08-15 NOTE — Discharge Instructions (Addendum)
Your work-up today was negative for any fracture or trauma.  Work note provided, return to work Monday if you are feeling improved.  Take Tylenol and Motrin for pain.  Return to ED for new concerning symptoms if needed.

## 2022-10-05 DIAGNOSIS — E559 Vitamin D deficiency, unspecified: Secondary | ICD-10-CM | POA: Diagnosis not present

## 2022-10-05 DIAGNOSIS — G43909 Migraine, unspecified, not intractable, without status migrainosus: Secondary | ICD-10-CM | POA: Diagnosis not present

## 2023-03-21 ENCOUNTER — Other Ambulatory Visit: Payer: Self-pay | Admitting: Internal Medicine

## 2023-03-21 DIAGNOSIS — Z1231 Encounter for screening mammogram for malignant neoplasm of breast: Secondary | ICD-10-CM

## 2023-04-19 DIAGNOSIS — Z794 Long term (current) use of insulin: Secondary | ICD-10-CM | POA: Diagnosis not present

## 2023-04-19 DIAGNOSIS — Z7984 Long term (current) use of oral hypoglycemic drugs: Secondary | ICD-10-CM | POA: Diagnosis not present

## 2023-04-19 DIAGNOSIS — F411 Generalized anxiety disorder: Secondary | ICD-10-CM | POA: Diagnosis not present

## 2023-04-19 DIAGNOSIS — E1140 Type 2 diabetes mellitus with diabetic neuropathy, unspecified: Secondary | ICD-10-CM | POA: Diagnosis not present

## 2023-04-19 DIAGNOSIS — F332 Major depressive disorder, recurrent severe without psychotic features: Secondary | ICD-10-CM | POA: Diagnosis not present

## 2023-04-19 DIAGNOSIS — E1165 Type 2 diabetes mellitus with hyperglycemia: Secondary | ICD-10-CM | POA: Diagnosis not present

## 2023-04-26 DIAGNOSIS — F411 Generalized anxiety disorder: Secondary | ICD-10-CM | POA: Diagnosis not present

## 2023-04-26 DIAGNOSIS — F332 Major depressive disorder, recurrent severe without psychotic features: Secondary | ICD-10-CM | POA: Diagnosis not present

## 2023-04-30 DIAGNOSIS — F332 Major depressive disorder, recurrent severe without psychotic features: Secondary | ICD-10-CM | POA: Diagnosis not present

## 2023-04-30 DIAGNOSIS — F411 Generalized anxiety disorder: Secondary | ICD-10-CM | POA: Diagnosis not present

## 2023-05-07 DIAGNOSIS — F411 Generalized anxiety disorder: Secondary | ICD-10-CM | POA: Diagnosis not present

## 2023-05-07 DIAGNOSIS — F332 Major depressive disorder, recurrent severe without psychotic features: Secondary | ICD-10-CM | POA: Diagnosis not present

## 2023-05-14 DIAGNOSIS — F411 Generalized anxiety disorder: Secondary | ICD-10-CM | POA: Diagnosis not present

## 2023-05-14 DIAGNOSIS — F332 Major depressive disorder, recurrent severe without psychotic features: Secondary | ICD-10-CM | POA: Diagnosis not present

## 2023-05-15 DIAGNOSIS — F332 Major depressive disorder, recurrent severe without psychotic features: Secondary | ICD-10-CM | POA: Diagnosis not present

## 2023-05-15 DIAGNOSIS — F411 Generalized anxiety disorder: Secondary | ICD-10-CM | POA: Diagnosis not present

## 2023-05-20 DIAGNOSIS — D72829 Elevated white blood cell count, unspecified: Secondary | ICD-10-CM | POA: Diagnosis not present

## 2023-05-20 DIAGNOSIS — D473 Essential (hemorrhagic) thrombocythemia: Secondary | ICD-10-CM | POA: Diagnosis not present

## 2023-05-21 ENCOUNTER — Other Ambulatory Visit: Payer: Self-pay | Admitting: Physician Assistant

## 2023-05-21 DIAGNOSIS — M5442 Lumbago with sciatica, left side: Secondary | ICD-10-CM | POA: Diagnosis not present

## 2023-05-21 DIAGNOSIS — F418 Other specified anxiety disorders: Secondary | ICD-10-CM | POA: Diagnosis not present

## 2023-05-21 DIAGNOSIS — F332 Major depressive disorder, recurrent severe without psychotic features: Secondary | ICD-10-CM | POA: Diagnosis not present

## 2023-05-21 DIAGNOSIS — Z794 Long term (current) use of insulin: Secondary | ICD-10-CM | POA: Diagnosis not present

## 2023-05-21 DIAGNOSIS — E1165 Type 2 diabetes mellitus with hyperglycemia: Secondary | ICD-10-CM | POA: Diagnosis not present

## 2023-05-21 DIAGNOSIS — M79605 Pain in left leg: Secondary | ICD-10-CM

## 2023-05-21 DIAGNOSIS — Z720 Tobacco use: Secondary | ICD-10-CM | POA: Diagnosis not present

## 2023-05-21 DIAGNOSIS — E569 Vitamin deficiency, unspecified: Secondary | ICD-10-CM | POA: Diagnosis not present

## 2023-05-21 DIAGNOSIS — N926 Irregular menstruation, unspecified: Secondary | ICD-10-CM | POA: Diagnosis not present

## 2023-05-21 DIAGNOSIS — R771 Abnormality of globulin: Secondary | ICD-10-CM | POA: Diagnosis not present

## 2023-05-21 DIAGNOSIS — G43909 Migraine, unspecified, not intractable, without status migrainosus: Secondary | ICD-10-CM | POA: Diagnosis not present

## 2023-05-21 DIAGNOSIS — E782 Mixed hyperlipidemia: Secondary | ICD-10-CM | POA: Diagnosis not present

## 2023-05-21 DIAGNOSIS — E1142 Type 2 diabetes mellitus with diabetic polyneuropathy: Secondary | ICD-10-CM | POA: Diagnosis not present

## 2023-05-21 DIAGNOSIS — F411 Generalized anxiety disorder: Secondary | ICD-10-CM | POA: Diagnosis not present

## 2023-05-21 DIAGNOSIS — G8929 Other chronic pain: Secondary | ICD-10-CM | POA: Diagnosis not present

## 2023-05-22 DIAGNOSIS — Z79899 Other long term (current) drug therapy: Secondary | ICD-10-CM | POA: Diagnosis not present

## 2023-05-22 DIAGNOSIS — M797 Fibromyalgia: Secondary | ICD-10-CM | POA: Diagnosis not present

## 2023-05-23 ENCOUNTER — Other Ambulatory Visit: Payer: Self-pay | Admitting: Physician Assistant

## 2023-05-23 DIAGNOSIS — Z1231 Encounter for screening mammogram for malignant neoplasm of breast: Secondary | ICD-10-CM

## 2023-05-24 DIAGNOSIS — H524 Presbyopia: Secondary | ICD-10-CM | POA: Diagnosis not present

## 2023-05-28 DIAGNOSIS — F332 Major depressive disorder, recurrent severe without psychotic features: Secondary | ICD-10-CM | POA: Diagnosis not present

## 2023-05-28 DIAGNOSIS — F411 Generalized anxiety disorder: Secondary | ICD-10-CM | POA: Diagnosis not present

## 2023-05-30 DIAGNOSIS — Z79899 Other long term (current) drug therapy: Secondary | ICD-10-CM | POA: Diagnosis not present

## 2023-05-30 DIAGNOSIS — Z6824 Body mass index (BMI) 24.0-24.9, adult: Secondary | ICD-10-CM | POA: Diagnosis not present

## 2023-05-30 DIAGNOSIS — M5137 Other intervertebral disc degeneration, lumbosacral region: Secondary | ICD-10-CM | POA: Diagnosis not present

## 2023-06-03 DIAGNOSIS — Z79899 Other long term (current) drug therapy: Secondary | ICD-10-CM | POA: Diagnosis not present

## 2023-06-04 DIAGNOSIS — G8929 Other chronic pain: Secondary | ICD-10-CM | POA: Diagnosis not present

## 2023-06-04 DIAGNOSIS — Z720 Tobacco use: Secondary | ICD-10-CM | POA: Diagnosis not present

## 2023-06-04 DIAGNOSIS — N3001 Acute cystitis with hematuria: Secondary | ICD-10-CM | POA: Diagnosis not present

## 2023-06-04 DIAGNOSIS — E782 Mixed hyperlipidemia: Secondary | ICD-10-CM | POA: Diagnosis not present

## 2023-06-04 DIAGNOSIS — N926 Irregular menstruation, unspecified: Secondary | ICD-10-CM | POA: Diagnosis not present

## 2023-06-04 DIAGNOSIS — E1165 Type 2 diabetes mellitus with hyperglycemia: Secondary | ICD-10-CM | POA: Diagnosis not present

## 2023-06-04 DIAGNOSIS — M5442 Lumbago with sciatica, left side: Secondary | ICD-10-CM | POA: Diagnosis not present

## 2023-06-04 DIAGNOSIS — G43909 Migraine, unspecified, not intractable, without status migrainosus: Secondary | ICD-10-CM | POA: Diagnosis not present

## 2023-06-04 DIAGNOSIS — Z794 Long term (current) use of insulin: Secondary | ICD-10-CM | POA: Diagnosis not present

## 2023-06-11 DIAGNOSIS — E1165 Type 2 diabetes mellitus with hyperglycemia: Secondary | ICD-10-CM | POA: Diagnosis not present

## 2023-06-11 DIAGNOSIS — E782 Mixed hyperlipidemia: Secondary | ICD-10-CM | POA: Diagnosis not present

## 2023-06-11 DIAGNOSIS — Z8719 Personal history of other diseases of the digestive system: Secondary | ICD-10-CM | POA: Diagnosis not present

## 2023-06-11 DIAGNOSIS — G8929 Other chronic pain: Secondary | ICD-10-CM | POA: Diagnosis not present

## 2023-06-11 DIAGNOSIS — Z720 Tobacco use: Secondary | ICD-10-CM | POA: Diagnosis not present

## 2023-06-11 DIAGNOSIS — G43909 Migraine, unspecified, not intractable, without status migrainosus: Secondary | ICD-10-CM | POA: Diagnosis not present

## 2023-06-11 DIAGNOSIS — M5442 Lumbago with sciatica, left side: Secondary | ICD-10-CM | POA: Diagnosis not present

## 2023-06-11 DIAGNOSIS — Z794 Long term (current) use of insulin: Secondary | ICD-10-CM | POA: Diagnosis not present

## 2023-06-11 DIAGNOSIS — Z0001 Encounter for general adult medical examination with abnormal findings: Secondary | ICD-10-CM | POA: Diagnosis not present

## 2023-06-14 DIAGNOSIS — Z79899 Other long term (current) drug therapy: Secondary | ICD-10-CM | POA: Diagnosis not present

## 2023-06-14 DIAGNOSIS — Z87828 Personal history of other (healed) physical injury and trauma: Secondary | ICD-10-CM | POA: Diagnosis not present

## 2023-06-14 DIAGNOSIS — M797 Fibromyalgia: Secondary | ICD-10-CM | POA: Diagnosis not present

## 2023-06-14 DIAGNOSIS — G43709 Chronic migraine without aura, not intractable, without status migrainosus: Secondary | ICD-10-CM | POA: Diagnosis not present

## 2023-06-14 DIAGNOSIS — M5137 Other intervertebral disc degeneration, lumbosacral region: Secondary | ICD-10-CM | POA: Diagnosis not present

## 2023-06-14 DIAGNOSIS — Z6823 Body mass index (BMI) 23.0-23.9, adult: Secondary | ICD-10-CM | POA: Diagnosis not present

## 2023-06-14 DIAGNOSIS — R208 Other disturbances of skin sensation: Secondary | ICD-10-CM | POA: Diagnosis not present

## 2023-06-21 DIAGNOSIS — Z79899 Other long term (current) drug therapy: Secondary | ICD-10-CM | POA: Diagnosis not present

## 2023-06-24 DIAGNOSIS — F411 Generalized anxiety disorder: Secondary | ICD-10-CM | POA: Diagnosis not present

## 2023-06-24 DIAGNOSIS — F332 Major depressive disorder, recurrent severe without psychotic features: Secondary | ICD-10-CM | POA: Diagnosis not present

## 2023-07-03 DIAGNOSIS — Z79899 Other long term (current) drug therapy: Secondary | ICD-10-CM | POA: Diagnosis not present

## 2023-07-03 DIAGNOSIS — M5137 Other intervertebral disc degeneration, lumbosacral region: Secondary | ICD-10-CM | POA: Diagnosis not present

## 2023-07-03 DIAGNOSIS — Z794 Long term (current) use of insulin: Secondary | ICD-10-CM | POA: Diagnosis not present

## 2023-07-03 DIAGNOSIS — Z6824 Body mass index (BMI) 24.0-24.9, adult: Secondary | ICD-10-CM | POA: Diagnosis not present

## 2023-07-03 DIAGNOSIS — Z87828 Personal history of other (healed) physical injury and trauma: Secondary | ICD-10-CM | POA: Diagnosis not present

## 2023-07-03 DIAGNOSIS — E119 Type 2 diabetes mellitus without complications: Secondary | ICD-10-CM | POA: Diagnosis not present

## 2023-07-03 DIAGNOSIS — M797 Fibromyalgia: Secondary | ICD-10-CM | POA: Diagnosis not present

## 2023-07-03 DIAGNOSIS — G43709 Chronic migraine without aura, not intractable, without status migrainosus: Secondary | ICD-10-CM | POA: Diagnosis not present

## 2023-07-03 DIAGNOSIS — R208 Other disturbances of skin sensation: Secondary | ICD-10-CM | POA: Diagnosis not present

## 2023-07-05 DIAGNOSIS — Z79899 Other long term (current) drug therapy: Secondary | ICD-10-CM | POA: Diagnosis not present

## 2023-07-10 DIAGNOSIS — F332 Major depressive disorder, recurrent severe without psychotic features: Secondary | ICD-10-CM | POA: Diagnosis not present

## 2023-07-10 DIAGNOSIS — F411 Generalized anxiety disorder: Secondary | ICD-10-CM | POA: Diagnosis not present

## 2023-07-18 DIAGNOSIS — F332 Major depressive disorder, recurrent severe without psychotic features: Secondary | ICD-10-CM | POA: Diagnosis not present

## 2023-07-18 DIAGNOSIS — F411 Generalized anxiety disorder: Secondary | ICD-10-CM | POA: Diagnosis not present

## 2023-07-23 DIAGNOSIS — F411 Generalized anxiety disorder: Secondary | ICD-10-CM | POA: Diagnosis not present

## 2023-07-23 DIAGNOSIS — F332 Major depressive disorder, recurrent severe without psychotic features: Secondary | ICD-10-CM | POA: Diagnosis not present

## 2023-07-25 DIAGNOSIS — F332 Major depressive disorder, recurrent severe without psychotic features: Secondary | ICD-10-CM | POA: Diagnosis not present

## 2023-07-25 DIAGNOSIS — F411 Generalized anxiety disorder: Secondary | ICD-10-CM | POA: Diagnosis not present

## 2023-07-28 DIAGNOSIS — E119 Type 2 diabetes mellitus without complications: Secondary | ICD-10-CM | POA: Diagnosis not present

## 2023-07-28 DIAGNOSIS — E559 Vitamin D deficiency, unspecified: Secondary | ICD-10-CM | POA: Diagnosis not present

## 2023-07-28 DIAGNOSIS — E1142 Type 2 diabetes mellitus with diabetic polyneuropathy: Secondary | ICD-10-CM | POA: Diagnosis not present

## 2023-07-28 DIAGNOSIS — R5383 Other fatigue: Secondary | ICD-10-CM | POA: Diagnosis not present

## 2023-07-28 DIAGNOSIS — Z6824 Body mass index (BMI) 24.0-24.9, adult: Secondary | ICD-10-CM | POA: Diagnosis not present

## 2023-07-28 DIAGNOSIS — M797 Fibromyalgia: Secondary | ICD-10-CM | POA: Diagnosis not present

## 2023-07-28 DIAGNOSIS — D539 Nutritional anemia, unspecified: Secondary | ICD-10-CM | POA: Diagnosis not present

## 2023-07-28 DIAGNOSIS — I7381 Erythromelalgia: Secondary | ICD-10-CM | POA: Diagnosis not present

## 2023-07-28 DIAGNOSIS — M5137 Other intervertebral disc degeneration, lumbosacral region: Secondary | ICD-10-CM | POA: Diagnosis not present

## 2023-07-28 DIAGNOSIS — E7800 Pure hypercholesterolemia, unspecified: Secondary | ICD-10-CM | POA: Diagnosis not present

## 2023-07-28 DIAGNOSIS — Z794 Long term (current) use of insulin: Secondary | ICD-10-CM | POA: Diagnosis not present

## 2023-07-28 DIAGNOSIS — Z79899 Other long term (current) drug therapy: Secondary | ICD-10-CM | POA: Diagnosis not present

## 2023-07-31 DIAGNOSIS — Z79899 Other long term (current) drug therapy: Secondary | ICD-10-CM | POA: Diagnosis not present

## 2023-08-01 DIAGNOSIS — F332 Major depressive disorder, recurrent severe without psychotic features: Secondary | ICD-10-CM | POA: Diagnosis not present

## 2023-08-01 DIAGNOSIS — F411 Generalized anxiety disorder: Secondary | ICD-10-CM | POA: Diagnosis not present

## 2023-08-06 DIAGNOSIS — F332 Major depressive disorder, recurrent severe without psychotic features: Secondary | ICD-10-CM | POA: Diagnosis not present

## 2023-08-06 DIAGNOSIS — F411 Generalized anxiety disorder: Secondary | ICD-10-CM | POA: Diagnosis not present

## 2023-08-14 DIAGNOSIS — F411 Generalized anxiety disorder: Secondary | ICD-10-CM | POA: Diagnosis not present

## 2023-08-14 DIAGNOSIS — F332 Major depressive disorder, recurrent severe without psychotic features: Secondary | ICD-10-CM | POA: Diagnosis not present

## 2023-08-22 DIAGNOSIS — F411 Generalized anxiety disorder: Secondary | ICD-10-CM | POA: Diagnosis not present

## 2023-08-22 DIAGNOSIS — F332 Major depressive disorder, recurrent severe without psychotic features: Secondary | ICD-10-CM | POA: Diagnosis not present

## 2023-08-25 DIAGNOSIS — M797 Fibromyalgia: Secondary | ICD-10-CM | POA: Diagnosis not present

## 2023-08-25 DIAGNOSIS — Z6824 Body mass index (BMI) 24.0-24.9, adult: Secondary | ICD-10-CM | POA: Diagnosis not present

## 2023-08-25 DIAGNOSIS — M5137 Other intervertebral disc degeneration, lumbosacral region: Secondary | ICD-10-CM | POA: Diagnosis not present

## 2023-08-25 DIAGNOSIS — E039 Hypothyroidism, unspecified: Secondary | ICD-10-CM | POA: Diagnosis not present

## 2023-08-25 DIAGNOSIS — E559 Vitamin D deficiency, unspecified: Secondary | ICD-10-CM | POA: Diagnosis not present

## 2023-08-25 DIAGNOSIS — E119 Type 2 diabetes mellitus without complications: Secondary | ICD-10-CM | POA: Diagnosis not present

## 2023-08-25 DIAGNOSIS — I7381 Erythromelalgia: Secondary | ICD-10-CM | POA: Diagnosis not present

## 2023-08-25 DIAGNOSIS — Z79899 Other long term (current) drug therapy: Secondary | ICD-10-CM | POA: Diagnosis not present

## 2023-08-25 DIAGNOSIS — E1142 Type 2 diabetes mellitus with diabetic polyneuropathy: Secondary | ICD-10-CM | POA: Diagnosis not present

## 2023-08-30 DIAGNOSIS — F411 Generalized anxiety disorder: Secondary | ICD-10-CM | POA: Diagnosis not present

## 2023-08-30 DIAGNOSIS — F332 Major depressive disorder, recurrent severe without psychotic features: Secondary | ICD-10-CM | POA: Diagnosis not present

## 2023-09-05 DIAGNOSIS — M25552 Pain in left hip: Secondary | ICD-10-CM | POA: Diagnosis not present

## 2023-09-05 DIAGNOSIS — M545 Low back pain, unspecified: Secondary | ICD-10-CM | POA: Diagnosis not present

## 2023-09-05 DIAGNOSIS — Y92009 Unspecified place in unspecified non-institutional (private) residence as the place of occurrence of the external cause: Secondary | ICD-10-CM | POA: Diagnosis not present

## 2023-09-05 DIAGNOSIS — M79604 Pain in right leg: Secondary | ICD-10-CM | POA: Diagnosis not present

## 2023-09-05 DIAGNOSIS — W19XXXA Unspecified fall, initial encounter: Secondary | ICD-10-CM | POA: Diagnosis not present

## 2023-09-05 DIAGNOSIS — M79605 Pain in left leg: Secondary | ICD-10-CM | POA: Diagnosis not present

## 2023-09-05 DIAGNOSIS — F332 Major depressive disorder, recurrent severe without psychotic features: Secondary | ICD-10-CM | POA: Diagnosis not present

## 2023-09-05 DIAGNOSIS — M25551 Pain in right hip: Secondary | ICD-10-CM | POA: Diagnosis not present

## 2023-09-05 DIAGNOSIS — F411 Generalized anxiety disorder: Secondary | ICD-10-CM | POA: Diagnosis not present

## 2023-09-10 DIAGNOSIS — F411 Generalized anxiety disorder: Secondary | ICD-10-CM | POA: Diagnosis not present

## 2023-09-10 DIAGNOSIS — F332 Major depressive disorder, recurrent severe without psychotic features: Secondary | ICD-10-CM | POA: Diagnosis not present

## 2023-09-17 DIAGNOSIS — F332 Major depressive disorder, recurrent severe without psychotic features: Secondary | ICD-10-CM | POA: Diagnosis not present

## 2023-09-17 DIAGNOSIS — F411 Generalized anxiety disorder: Secondary | ICD-10-CM | POA: Diagnosis not present

## 2023-09-18 DIAGNOSIS — Z794 Long term (current) use of insulin: Secondary | ICD-10-CM | POA: Diagnosis not present

## 2023-09-18 DIAGNOSIS — E1142 Type 2 diabetes mellitus with diabetic polyneuropathy: Secondary | ICD-10-CM | POA: Diagnosis not present

## 2023-09-18 DIAGNOSIS — T63423A Toxic effect of venom of ants, assault, initial encounter: Secondary | ICD-10-CM | POA: Diagnosis not present

## 2023-09-22 DIAGNOSIS — I7381 Erythromelalgia: Secondary | ICD-10-CM | POA: Diagnosis not present

## 2023-09-22 DIAGNOSIS — E039 Hypothyroidism, unspecified: Secondary | ICD-10-CM | POA: Diagnosis not present

## 2023-09-22 DIAGNOSIS — E119 Type 2 diabetes mellitus without complications: Secondary | ICD-10-CM | POA: Diagnosis not present

## 2023-09-22 DIAGNOSIS — Z79899 Other long term (current) drug therapy: Secondary | ICD-10-CM | POA: Diagnosis not present

## 2023-09-22 DIAGNOSIS — R894 Abnormal immunological findings in specimens from other organs, systems and tissues: Secondary | ICD-10-CM | POA: Diagnosis not present

## 2023-09-22 DIAGNOSIS — M5137 Other intervertebral disc degeneration, lumbosacral region with discogenic back pain only: Secondary | ICD-10-CM | POA: Diagnosis not present

## 2023-09-22 DIAGNOSIS — E1142 Type 2 diabetes mellitus with diabetic polyneuropathy: Secondary | ICD-10-CM | POA: Diagnosis not present

## 2023-09-22 DIAGNOSIS — Z6825 Body mass index (BMI) 25.0-25.9, adult: Secondary | ICD-10-CM | POA: Diagnosis not present

## 2023-09-22 DIAGNOSIS — Z794 Long term (current) use of insulin: Secondary | ICD-10-CM | POA: Diagnosis not present

## 2023-09-25 DIAGNOSIS — F411 Generalized anxiety disorder: Secondary | ICD-10-CM | POA: Diagnosis not present

## 2023-09-25 DIAGNOSIS — F332 Major depressive disorder, recurrent severe without psychotic features: Secondary | ICD-10-CM | POA: Diagnosis not present

## 2023-09-26 DIAGNOSIS — Z79899 Other long term (current) drug therapy: Secondary | ICD-10-CM | POA: Diagnosis not present

## 2023-10-01 DIAGNOSIS — F411 Generalized anxiety disorder: Secondary | ICD-10-CM | POA: Diagnosis not present

## 2023-10-01 DIAGNOSIS — F332 Major depressive disorder, recurrent severe without psychotic features: Secondary | ICD-10-CM | POA: Diagnosis not present

## 2023-10-05 DIAGNOSIS — Z91199 Patient's noncompliance with other medical treatment and regimen due to unspecified reason: Secondary | ICD-10-CM | POA: Diagnosis not present

## 2023-10-05 DIAGNOSIS — E1165 Type 2 diabetes mellitus with hyperglycemia: Secondary | ICD-10-CM | POA: Diagnosis not present

## 2023-10-09 DIAGNOSIS — F411 Generalized anxiety disorder: Secondary | ICD-10-CM | POA: Diagnosis not present

## 2023-10-09 DIAGNOSIS — F332 Major depressive disorder, recurrent severe without psychotic features: Secondary | ICD-10-CM | POA: Diagnosis not present

## 2023-10-16 DIAGNOSIS — F332 Major depressive disorder, recurrent severe without psychotic features: Secondary | ICD-10-CM | POA: Diagnosis not present

## 2023-10-16 DIAGNOSIS — F411 Generalized anxiety disorder: Secondary | ICD-10-CM | POA: Diagnosis not present

## 2023-12-09 ENCOUNTER — Other Ambulatory Visit: Payer: Self-pay | Admitting: Orthopedic Surgery

## 2023-12-09 DIAGNOSIS — M8008XA Age-related osteoporosis with current pathological fracture, vertebra(e), initial encounter for fracture: Secondary | ICD-10-CM

## 2023-12-25 DIAGNOSIS — R0600 Dyspnea, unspecified: Secondary | ICD-10-CM | POA: Diagnosis not present

## 2024-01-02 ENCOUNTER — Inpatient Hospital Stay: Admission: RE | Admit: 2024-01-02 | Payer: Medicare HMO | Source: Ambulatory Visit

## 2024-01-08 DIAGNOSIS — D473 Essential (hemorrhagic) thrombocythemia: Secondary | ICD-10-CM | POA: Diagnosis not present

## 2024-01-08 DIAGNOSIS — D72829 Elevated white blood cell count, unspecified: Secondary | ICD-10-CM | POA: Diagnosis not present

## 2024-01-18 ENCOUNTER — Inpatient Hospital Stay: Admission: RE | Admit: 2024-01-18 | Payer: Medicare HMO | Source: Ambulatory Visit

## 2024-02-08 DIAGNOSIS — Z79899 Other long term (current) drug therapy: Secondary | ICD-10-CM | POA: Diagnosis not present

## 2024-02-21 DIAGNOSIS — I7300 Raynaud's syndrome without gangrene: Secondary | ICD-10-CM | POA: Diagnosis not present

## 2024-02-21 DIAGNOSIS — T07XXXA Unspecified multiple injuries, initial encounter: Secondary | ICD-10-CM | POA: Diagnosis not present

## 2024-02-28 DIAGNOSIS — R9389 Abnormal findings on diagnostic imaging of other specified body structures: Secondary | ICD-10-CM | POA: Diagnosis not present

## 2024-02-28 DIAGNOSIS — R000 Tachycardia, unspecified: Secondary | ICD-10-CM | POA: Diagnosis not present

## 2024-02-28 DIAGNOSIS — R1032 Left lower quadrant pain: Secondary | ICD-10-CM | POA: Diagnosis not present

## 2024-02-28 DIAGNOSIS — N200 Calculus of kidney: Secondary | ICD-10-CM | POA: Diagnosis not present

## 2024-02-28 DIAGNOSIS — R42 Dizziness and giddiness: Secondary | ICD-10-CM | POA: Diagnosis not present

## 2024-02-28 DIAGNOSIS — R1084 Generalized abdominal pain: Secondary | ICD-10-CM | POA: Diagnosis not present

## 2024-02-28 DIAGNOSIS — K5900 Constipation, unspecified: Secondary | ICD-10-CM | POA: Diagnosis not present

## 2024-02-28 DIAGNOSIS — N3000 Acute cystitis without hematuria: Secondary | ICD-10-CM | POA: Diagnosis not present

## 2024-02-28 DIAGNOSIS — R079 Chest pain, unspecified: Secondary | ICD-10-CM | POA: Diagnosis not present

## 2024-02-28 DIAGNOSIS — E860 Dehydration: Secondary | ICD-10-CM | POA: Diagnosis not present

## 2024-03-04 DIAGNOSIS — G8929 Other chronic pain: Secondary | ICD-10-CM | POA: Diagnosis not present

## 2024-03-04 DIAGNOSIS — R42 Dizziness and giddiness: Secondary | ICD-10-CM | POA: Diagnosis not present

## 2024-03-04 DIAGNOSIS — Z79899 Other long term (current) drug therapy: Secondary | ICD-10-CM | POA: Diagnosis not present

## 2024-03-04 DIAGNOSIS — M5137 Other intervertebral disc degeneration, lumbosacral region with discogenic back pain only: Secondary | ICD-10-CM | POA: Diagnosis not present

## 2024-03-04 DIAGNOSIS — M25512 Pain in left shoulder: Secondary | ICD-10-CM | POA: Diagnosis not present

## 2024-03-04 DIAGNOSIS — Z09 Encounter for follow-up examination after completed treatment for conditions other than malignant neoplasm: Secondary | ICD-10-CM | POA: Diagnosis not present

## 2024-03-04 DIAGNOSIS — I7381 Erythromelalgia: Secondary | ICD-10-CM | POA: Diagnosis not present

## 2024-03-04 DIAGNOSIS — L989 Disorder of the skin and subcutaneous tissue, unspecified: Secondary | ICD-10-CM | POA: Diagnosis not present

## 2024-03-04 DIAGNOSIS — Z794 Long term (current) use of insulin: Secondary | ICD-10-CM | POA: Diagnosis not present

## 2024-03-04 DIAGNOSIS — E1142 Type 2 diabetes mellitus with diabetic polyneuropathy: Secondary | ICD-10-CM | POA: Diagnosis not present

## 2024-03-04 DIAGNOSIS — E119 Type 2 diabetes mellitus without complications: Secondary | ICD-10-CM | POA: Diagnosis not present

## 2024-03-08 DIAGNOSIS — Z1159 Encounter for screening for other viral diseases: Secondary | ICD-10-CM | POA: Diagnosis not present

## 2024-03-08 DIAGNOSIS — Z760 Encounter for issue of repeat prescription: Secondary | ICD-10-CM | POA: Diagnosis not present

## 2024-03-08 DIAGNOSIS — Z1211 Encounter for screening for malignant neoplasm of colon: Secondary | ICD-10-CM | POA: Diagnosis not present

## 2024-03-08 DIAGNOSIS — Z1231 Encounter for screening mammogram for malignant neoplasm of breast: Secondary | ICD-10-CM | POA: Diagnosis not present

## 2024-03-08 DIAGNOSIS — E119 Type 2 diabetes mellitus without complications: Secondary | ICD-10-CM | POA: Diagnosis not present

## 2024-03-08 DIAGNOSIS — R0602 Shortness of breath: Secondary | ICD-10-CM | POA: Diagnosis not present

## 2024-03-08 DIAGNOSIS — E559 Vitamin D deficiency, unspecified: Secondary | ICD-10-CM | POA: Diagnosis not present

## 2024-03-08 DIAGNOSIS — E1142 Type 2 diabetes mellitus with diabetic polyneuropathy: Secondary | ICD-10-CM | POA: Diagnosis not present

## 2024-03-08 DIAGNOSIS — Z794 Long term (current) use of insulin: Secondary | ICD-10-CM | POA: Diagnosis not present

## 2024-03-08 DIAGNOSIS — D539 Nutritional anemia, unspecified: Secondary | ICD-10-CM | POA: Diagnosis not present

## 2024-03-29 DIAGNOSIS — J069 Acute upper respiratory infection, unspecified: Secondary | ICD-10-CM | POA: Diagnosis not present

## 2024-03-29 DIAGNOSIS — R059 Cough, unspecified: Secondary | ICD-10-CM | POA: Diagnosis not present

## 2024-03-29 DIAGNOSIS — R079 Chest pain, unspecified: Secondary | ICD-10-CM | POA: Diagnosis not present

## 2024-03-29 DIAGNOSIS — E559 Vitamin D deficiency, unspecified: Secondary | ICD-10-CM | POA: Diagnosis not present

## 2024-03-29 DIAGNOSIS — Z79899 Other long term (current) drug therapy: Secondary | ICD-10-CM | POA: Diagnosis not present

## 2024-03-29 DIAGNOSIS — E119 Type 2 diabetes mellitus without complications: Secondary | ICD-10-CM | POA: Diagnosis not present

## 2024-03-29 DIAGNOSIS — M5137 Other intervertebral disc degeneration, lumbosacral region with discogenic back pain only: Secondary | ICD-10-CM | POA: Diagnosis not present

## 2024-03-29 DIAGNOSIS — E7800 Pure hypercholesterolemia, unspecified: Secondary | ICD-10-CM | POA: Diagnosis not present

## 2024-03-29 DIAGNOSIS — F17210 Nicotine dependence, cigarettes, uncomplicated: Secondary | ICD-10-CM | POA: Diagnosis not present

## 2024-03-29 DIAGNOSIS — Z794 Long term (current) use of insulin: Secondary | ICD-10-CM | POA: Diagnosis not present

## 2024-03-29 DIAGNOSIS — Z0000 Encounter for general adult medical examination without abnormal findings: Secondary | ICD-10-CM | POA: Diagnosis not present

## 2024-04-02 DIAGNOSIS — Z79899 Other long term (current) drug therapy: Secondary | ICD-10-CM | POA: Diagnosis not present

## 2024-04-09 DIAGNOSIS — M5416 Radiculopathy, lumbar region: Secondary | ICD-10-CM | POA: Diagnosis not present

## 2024-04-12 DIAGNOSIS — R42 Dizziness and giddiness: Secondary | ICD-10-CM | POA: Diagnosis not present

## 2024-04-12 DIAGNOSIS — G2581 Restless legs syndrome: Secondary | ICD-10-CM | POA: Diagnosis not present

## 2024-04-12 DIAGNOSIS — M62830 Muscle spasm of back: Secondary | ICD-10-CM | POA: Diagnosis not present

## 2024-04-12 DIAGNOSIS — Z794 Long term (current) use of insulin: Secondary | ICD-10-CM | POA: Diagnosis not present

## 2024-04-12 DIAGNOSIS — E119 Type 2 diabetes mellitus without complications: Secondary | ICD-10-CM | POA: Diagnosis not present

## 2024-04-12 DIAGNOSIS — J069 Acute upper respiratory infection, unspecified: Secondary | ICD-10-CM | POA: Diagnosis not present

## 2024-04-12 DIAGNOSIS — M5137 Other intervertebral disc degeneration, lumbosacral region with discogenic back pain only: Secondary | ICD-10-CM | POA: Diagnosis not present

## 2024-04-12 DIAGNOSIS — Z79899 Other long term (current) drug therapy: Secondary | ICD-10-CM | POA: Diagnosis not present

## 2024-04-12 DIAGNOSIS — E7800 Pure hypercholesterolemia, unspecified: Secondary | ICD-10-CM | POA: Diagnosis not present

## 2024-04-12 DIAGNOSIS — R079 Chest pain, unspecified: Secondary | ICD-10-CM | POA: Diagnosis not present

## 2024-04-17 DIAGNOSIS — M5137 Other intervertebral disc degeneration, lumbosacral region with discogenic back pain only: Secondary | ICD-10-CM | POA: Diagnosis not present

## 2024-04-17 DIAGNOSIS — R42 Dizziness and giddiness: Secondary | ICD-10-CM | POA: Diagnosis not present

## 2024-04-17 DIAGNOSIS — R079 Chest pain, unspecified: Secondary | ICD-10-CM | POA: Diagnosis not present

## 2024-04-26 DIAGNOSIS — Z794 Long term (current) use of insulin: Secondary | ICD-10-CM | POA: Diagnosis not present

## 2024-04-26 DIAGNOSIS — R42 Dizziness and giddiness: Secondary | ICD-10-CM | POA: Diagnosis not present

## 2024-04-26 DIAGNOSIS — R079 Chest pain, unspecified: Secondary | ICD-10-CM | POA: Diagnosis not present

## 2024-04-26 DIAGNOSIS — G43909 Migraine, unspecified, not intractable, without status migrainosus: Secondary | ICD-10-CM | POA: Diagnosis not present

## 2024-04-26 DIAGNOSIS — Z79899 Other long term (current) drug therapy: Secondary | ICD-10-CM | POA: Diagnosis not present

## 2024-04-26 DIAGNOSIS — Z1231 Encounter for screening mammogram for malignant neoplasm of breast: Secondary | ICD-10-CM | POA: Diagnosis not present

## 2024-04-26 DIAGNOSIS — M5137 Other intervertebral disc degeneration, lumbosacral region with discogenic back pain only: Secondary | ICD-10-CM | POA: Diagnosis not present

## 2024-04-26 DIAGNOSIS — Z1211 Encounter for screening for malignant neoplasm of colon: Secondary | ICD-10-CM | POA: Diagnosis not present

## 2024-04-26 DIAGNOSIS — E7800 Pure hypercholesterolemia, unspecified: Secondary | ICD-10-CM | POA: Diagnosis not present

## 2024-04-26 DIAGNOSIS — E119 Type 2 diabetes mellitus without complications: Secondary | ICD-10-CM | POA: Diagnosis not present

## 2024-04-29 DIAGNOSIS — Z20822 Contact with and (suspected) exposure to covid-19: Secondary | ICD-10-CM | POA: Diagnosis not present

## 2024-04-29 DIAGNOSIS — R000 Tachycardia, unspecified: Secondary | ICD-10-CM | POA: Diagnosis not present

## 2024-04-29 DIAGNOSIS — R519 Headache, unspecified: Secondary | ICD-10-CM | POA: Diagnosis not present

## 2024-04-29 DIAGNOSIS — I959 Hypotension, unspecified: Secondary | ICD-10-CM | POA: Diagnosis not present

## 2024-04-30 DIAGNOSIS — R079 Chest pain, unspecified: Secondary | ICD-10-CM | POA: Diagnosis not present

## 2024-05-05 DIAGNOSIS — R079 Chest pain, unspecified: Secondary | ICD-10-CM | POA: Diagnosis not present

## 2024-05-07 DIAGNOSIS — E1165 Type 2 diabetes mellitus with hyperglycemia: Secondary | ICD-10-CM | POA: Diagnosis not present

## 2024-05-07 DIAGNOSIS — Z794 Long term (current) use of insulin: Secondary | ICD-10-CM | POA: Diagnosis not present

## 2024-05-12 ENCOUNTER — Other Ambulatory Visit (HOSPITAL_COMMUNITY): Payer: Self-pay | Admitting: Family Medicine

## 2024-05-12 DIAGNOSIS — R079 Chest pain, unspecified: Secondary | ICD-10-CM

## 2024-05-24 DIAGNOSIS — Z79899 Other long term (current) drug therapy: Secondary | ICD-10-CM | POA: Diagnosis not present

## 2024-05-24 DIAGNOSIS — G43709 Chronic migraine without aura, not intractable, without status migrainosus: Secondary | ICD-10-CM | POA: Diagnosis not present

## 2024-05-24 DIAGNOSIS — R079 Chest pain, unspecified: Secondary | ICD-10-CM | POA: Diagnosis not present

## 2024-05-24 DIAGNOSIS — E119 Type 2 diabetes mellitus without complications: Secondary | ICD-10-CM | POA: Diagnosis not present

## 2024-05-24 DIAGNOSIS — E7800 Pure hypercholesterolemia, unspecified: Secondary | ICD-10-CM | POA: Diagnosis not present

## 2024-05-24 DIAGNOSIS — Z794 Long term (current) use of insulin: Secondary | ICD-10-CM | POA: Diagnosis not present

## 2024-05-24 DIAGNOSIS — Z1231 Encounter for screening mammogram for malignant neoplasm of breast: Secondary | ICD-10-CM | POA: Diagnosis not present

## 2024-05-24 DIAGNOSIS — Z1211 Encounter for screening for malignant neoplasm of colon: Secondary | ICD-10-CM | POA: Diagnosis not present

## 2024-05-24 DIAGNOSIS — M5137 Other intervertebral disc degeneration, lumbosacral region with discogenic back pain only: Secondary | ICD-10-CM | POA: Diagnosis not present

## 2024-05-24 DIAGNOSIS — R42 Dizziness and giddiness: Secondary | ICD-10-CM | POA: Diagnosis not present

## 2024-06-11 ENCOUNTER — Encounter (HOSPITAL_COMMUNITY): Payer: Self-pay

## 2024-06-16 ENCOUNTER — Telehealth (HOSPITAL_COMMUNITY): Payer: Self-pay | Admitting: *Deleted

## 2024-06-16 NOTE — Telephone Encounter (Signed)
 Attempted to call patient regarding upcoming cardiac CT appointment. Voicemail box if full but was able to send an SMS notification to patient.  Chantal Requena RN Navigator Cardiac Imaging [facility] [phone] Office [phone] Cell

## 2024-06-17 ENCOUNTER — Ambulatory Visit (HOSPITAL_COMMUNITY): Admission: RE | Admit: 2024-06-17 | Source: Ambulatory Visit

## 2024-06-17 DIAGNOSIS — T148XXA Other injury of unspecified body region, initial encounter: Secondary | ICD-10-CM | POA: Diagnosis not present

## 2024-06-17 DIAGNOSIS — E871 Hypo-osmolality and hyponatremia: Secondary | ICD-10-CM | POA: Diagnosis not present

## 2024-06-17 DIAGNOSIS — N390 Urinary tract infection, site not specified: Secondary | ICD-10-CM | POA: Diagnosis not present

## 2024-06-17 DIAGNOSIS — R519 Headache, unspecified: Secondary | ICD-10-CM | POA: Diagnosis not present

## 2024-06-17 DIAGNOSIS — M7989 Other specified soft tissue disorders: Secondary | ICD-10-CM | POA: Diagnosis not present

## 2024-06-17 DIAGNOSIS — S80862A Insect bite (nonvenomous), left lower leg, initial encounter: Secondary | ICD-10-CM | POA: Diagnosis not present

## 2024-06-19 DIAGNOSIS — G2581 Restless legs syndrome: Secondary | ICD-10-CM | POA: Diagnosis not present

## 2024-06-19 DIAGNOSIS — G4700 Insomnia, unspecified: Secondary | ICD-10-CM | POA: Diagnosis not present

## 2024-06-19 DIAGNOSIS — M5137 Other intervertebral disc degeneration, lumbosacral region with discogenic back pain only: Secondary | ICD-10-CM | POA: Diagnosis not present

## 2024-06-19 DIAGNOSIS — M792 Neuralgia and neuritis, unspecified: Secondary | ICD-10-CM | POA: Diagnosis not present

## 2024-06-24 ENCOUNTER — Telehealth (HOSPITAL_COMMUNITY): Payer: Self-pay | Admitting: Emergency Medicine

## 2024-06-24 NOTE — Telephone Encounter (Signed)
 Unable to leave vm.

## 2024-06-25 ENCOUNTER — Ambulatory Visit (HOSPITAL_COMMUNITY)
Admission: RE | Admit: 2024-06-25 | Discharge: 2024-06-25 | Disposition: A | Discharge status: Home / Self Care | Source: Ambulatory Visit | Attending: Family Medicine | Admitting: Family Medicine

## 2024-06-25 ENCOUNTER — Encounter (HOSPITAL_COMMUNITY): Payer: Self-pay

## 2024-06-25 DIAGNOSIS — R079 Chest pain, unspecified: Secondary | ICD-10-CM

## 2024-06-25 DIAGNOSIS — Z79899 Other long term (current) drug therapy: Secondary | ICD-10-CM | POA: Diagnosis not present

## 2024-06-30 DIAGNOSIS — I96 Gangrene, not elsewhere classified: Secondary | ICD-10-CM | POA: Diagnosis not present

## 2024-06-30 DIAGNOSIS — D539 Nutritional anemia, unspecified: Secondary | ICD-10-CM | POA: Diagnosis not present

## 2024-06-30 DIAGNOSIS — Z89422 Acquired absence of other left toe(s): Secondary | ICD-10-CM | POA: Diagnosis not present

## 2024-06-30 DIAGNOSIS — E871 Hypo-osmolality and hyponatremia: Secondary | ICD-10-CM | POA: Diagnosis not present

## 2024-06-30 DIAGNOSIS — E11621 Type 2 diabetes mellitus with foot ulcer: Secondary | ICD-10-CM | POA: Diagnosis not present

## 2024-06-30 DIAGNOSIS — Z7982 Long term (current) use of aspirin: Secondary | ICD-10-CM | POA: Diagnosis not present

## 2024-06-30 DIAGNOSIS — R079 Chest pain, unspecified: Secondary | ICD-10-CM | POA: Diagnosis not present

## 2024-06-30 DIAGNOSIS — M86272 Subacute osteomyelitis, left ankle and foot: Secondary | ICD-10-CM | POA: Diagnosis not present

## 2024-06-30 DIAGNOSIS — Z8673 Personal history of transient ischemic attack (TIA), and cerebral infarction without residual deficits: Secondary | ICD-10-CM | POA: Diagnosis not present

## 2024-06-30 DIAGNOSIS — R000 Tachycardia, unspecified: Secondary | ICD-10-CM | POA: Diagnosis not present

## 2024-06-30 DIAGNOSIS — Z882 Allergy status to sulfonamides status: Secondary | ICD-10-CM | POA: Diagnosis not present

## 2024-06-30 DIAGNOSIS — R600 Localized edema: Secondary | ICD-10-CM | POA: Diagnosis not present

## 2024-06-30 DIAGNOSIS — K219 Gastro-esophageal reflux disease without esophagitis: Secondary | ICD-10-CM | POA: Diagnosis not present

## 2024-06-30 DIAGNOSIS — M86172 Other acute osteomyelitis, left ankle and foot: Secondary | ICD-10-CM | POA: Diagnosis not present

## 2024-06-30 DIAGNOSIS — I491 Atrial premature depolarization: Secondary | ICD-10-CM | POA: Diagnosis not present

## 2024-06-30 DIAGNOSIS — Z794 Long term (current) use of insulin: Secondary | ICD-10-CM | POA: Diagnosis not present

## 2024-06-30 DIAGNOSIS — Z4789 Encounter for other orthopedic aftercare: Secondary | ICD-10-CM | POA: Diagnosis not present

## 2024-06-30 DIAGNOSIS — M726 Necrotizing fasciitis: Secondary | ICD-10-CM | POA: Diagnosis not present

## 2024-06-30 DIAGNOSIS — Z7409 Other reduced mobility: Secondary | ICD-10-CM | POA: Diagnosis not present

## 2024-06-30 DIAGNOSIS — R238 Other skin changes: Secondary | ICD-10-CM | POA: Diagnosis not present

## 2024-06-30 DIAGNOSIS — M869 Osteomyelitis, unspecified: Secondary | ICD-10-CM | POA: Diagnosis not present

## 2024-06-30 DIAGNOSIS — M7989 Other specified soft tissue disorders: Secondary | ICD-10-CM | POA: Diagnosis not present

## 2024-06-30 DIAGNOSIS — E1142 Type 2 diabetes mellitus with diabetic polyneuropathy: Secondary | ICD-10-CM | POA: Diagnosis not present

## 2024-06-30 DIAGNOSIS — S91302A Unspecified open wound, left foot, initial encounter: Secondary | ICD-10-CM | POA: Diagnosis not present

## 2024-06-30 DIAGNOSIS — I10 Essential (primary) hypertension: Secondary | ICD-10-CM | POA: Diagnosis not present

## 2024-06-30 DIAGNOSIS — E1165 Type 2 diabetes mellitus with hyperglycemia: Secondary | ICD-10-CM | POA: Diagnosis not present

## 2024-06-30 DIAGNOSIS — Z91040 Latex allergy status: Secondary | ICD-10-CM | POA: Diagnosis not present

## 2024-06-30 DIAGNOSIS — L089 Local infection of the skin and subcutaneous tissue, unspecified: Secondary | ICD-10-CM | POA: Diagnosis not present

## 2024-06-30 DIAGNOSIS — Z87442 Personal history of urinary calculi: Secondary | ICD-10-CM | POA: Diagnosis not present

## 2024-06-30 DIAGNOSIS — M79672 Pain in left foot: Secondary | ICD-10-CM | POA: Diagnosis not present

## 2024-06-30 DIAGNOSIS — E1169 Type 2 diabetes mellitus with other specified complication: Secondary | ICD-10-CM | POA: Diagnosis not present

## 2024-06-30 DIAGNOSIS — E11628 Type 2 diabetes mellitus with other skin complications: Secondary | ICD-10-CM | POA: Diagnosis not present

## 2024-06-30 DIAGNOSIS — L02612 Cutaneous abscess of left foot: Secondary | ICD-10-CM | POA: Diagnosis not present

## 2024-06-30 DIAGNOSIS — F5101 Primary insomnia: Secondary | ICD-10-CM | POA: Diagnosis not present

## 2024-06-30 DIAGNOSIS — R0602 Shortness of breath: Secondary | ICD-10-CM | POA: Diagnosis not present

## 2024-06-30 DIAGNOSIS — M797 Fibromyalgia: Secondary | ICD-10-CM | POA: Diagnosis not present

## 2024-06-30 DIAGNOSIS — F17210 Nicotine dependence, cigarettes, uncomplicated: Secondary | ICD-10-CM | POA: Diagnosis not present

## 2024-06-30 DIAGNOSIS — Z881 Allergy status to other antibiotic agents status: Secondary | ICD-10-CM | POA: Diagnosis not present

## 2024-06-30 DIAGNOSIS — R918 Other nonspecific abnormal finding of lung field: Secondary | ICD-10-CM | POA: Diagnosis not present

## 2024-06-30 DIAGNOSIS — Z888 Allergy status to other drugs, medicaments and biological substances status: Secondary | ICD-10-CM | POA: Diagnosis not present

## 2024-06-30 DIAGNOSIS — L97529 Non-pressure chronic ulcer of other part of left foot with unspecified severity: Secondary | ICD-10-CM | POA: Diagnosis not present

## 2024-07-01 DIAGNOSIS — L089 Local infection of the skin and subcutaneous tissue, unspecified: Secondary | ICD-10-CM | POA: Diagnosis not present

## 2024-07-01 DIAGNOSIS — S91302A Unspecified open wound, left foot, initial encounter: Secondary | ICD-10-CM | POA: Diagnosis not present

## 2024-07-01 DIAGNOSIS — L02612 Cutaneous abscess of left foot: Secondary | ICD-10-CM | POA: Diagnosis not present

## 2024-07-02 DIAGNOSIS — L02612 Cutaneous abscess of left foot: Secondary | ICD-10-CM | POA: Diagnosis not present

## 2024-07-02 DIAGNOSIS — L089 Local infection of the skin and subcutaneous tissue, unspecified: Secondary | ICD-10-CM | POA: Diagnosis not present

## 2024-07-02 DIAGNOSIS — E11628 Type 2 diabetes mellitus with other skin complications: Secondary | ICD-10-CM | POA: Diagnosis not present

## 2024-07-02 DIAGNOSIS — M86272 Subacute osteomyelitis, left ankle and foot: Secondary | ICD-10-CM | POA: Diagnosis not present

## 2024-07-06 DIAGNOSIS — L02612 Cutaneous abscess of left foot: Secondary | ICD-10-CM | POA: Diagnosis not present

## 2024-07-06 DIAGNOSIS — M86272 Subacute osteomyelitis, left ankle and foot: Secondary | ICD-10-CM | POA: Diagnosis not present

## 2024-07-06 DIAGNOSIS — E11628 Type 2 diabetes mellitus with other skin complications: Secondary | ICD-10-CM | POA: Diagnosis not present

## 2024-07-06 DIAGNOSIS — L089 Local infection of the skin and subcutaneous tissue, unspecified: Secondary | ICD-10-CM | POA: Diagnosis not present

## 2024-07-07 DIAGNOSIS — L089 Local infection of the skin and subcutaneous tissue, unspecified: Secondary | ICD-10-CM | POA: Diagnosis not present

## 2024-07-07 DIAGNOSIS — M86272 Subacute osteomyelitis, left ankle and foot: Secondary | ICD-10-CM | POA: Diagnosis not present

## 2024-07-07 DIAGNOSIS — E11628 Type 2 diabetes mellitus with other skin complications: Secondary | ICD-10-CM | POA: Diagnosis not present

## 2024-07-07 DIAGNOSIS — L02612 Cutaneous abscess of left foot: Secondary | ICD-10-CM | POA: Diagnosis not present

## 2024-07-07 NOTE — Discharge Summary (Addendum)
 @IPENCSERVICE @  Discharge Summary   Name: Phyllis Pittman Age: 48 yrs  MRN: [id number] DOB: Jul 18, 1976  Admit Date: 06/30/2024 Admitting Physician: Albina Sor, MD  Discharge Date and Time: 07/07/2024  Discharge Physician: Erica MALVA Lia, MD    Predictive Model Details       16% (Medium) Factor Value   Risk of Unplanned Readmission Model Number of active inpatient medication orders 35    Number of ED visits in last six months 4    Active NSAID inpatient medication order present    ECG/EKG order present in last 6 months    Latest calcium low (7.9 mg/dL)    Diagnosis of electrolyte disorder present    Imaging order present in last 6 months    Current length of stay 6.22 days    Latest hemoglobin low (9.8 g/dL)    Diagnosis of deficiency anemia present    Active anticoagulant inpatient medication order present    Age 26    Charlson Comorbidity Index 2    Future appointment scheduled    Active ulcer inpatient medication order present    Admission Diagnoses:    Necrotizing fasciitis    (CMD) [M72.6] Diabetic foot infection    (CMD) [E11.628, L08.9] Diabetic infection of left foot    (CMD) [E11.628, L08.9] Left foot infection [L08.9]  Discharge Diagnoses:   Principal Problem:   Diabetic infection of left foot    (CMD) Active Problems:   Gastroesophageal reflux disease without esophagitis   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 *     *For documentation of patient's Past Medical History and Problem List, see the last page. TO DO List at Follow-up for PCP/Specialist:   Key Medication changes: Rocephin  2 g daily iv until 08/12/2024. Order CBC CMP weekly while the patient is on antibiotics Pending labs to follow up on:   Incidental findings requiring follow-up:  Other:    Indication for Hospitalization/Hospital Course:   For the details of admission, please see the H&P by Dr.N Sor  on 06/30/2024.  For full details, please see progress notes, consult notes and ancillary notes.  The patient's hospital course will be summarized in a problem based approach below.  Phyllis Pittman, a 48 year old female with a history of uncontrolled type 2 diabetes mellitus, hypertension, hyperlipidemia, and diabetic neuropathy, was admitted to [facility] on 06/30/2024 for evaluation of a diabetic infection of the left foot. Initial evaluation in the ED revealed significant wound cellulitis/abscess with concern for necrotizing soft tissue infection, supported by imaging findings of soft tissue emphysema and elevated inflammatory markers. The principal diagnosis was confirmed as osteomyelitis of the left fifth metatarsal, necessitating surgical intervention.  The patient underwent incision and drainage (I&D) with partial fifth ray amputation on 07/01/2024. Postoperatively, she was started on IV antibiotics, including vancomycin and ceftriaxone , with cultures revealing Streptococcus agalactiae. A PICC line was placed on 07/06/2024 to facilitate long-term antibiotic therapy, with plans for outpatient parenteral antimicrobial therapy (OPAT) at an infusion center.  Throughout her hospitalization, Phyllis Pittman's poorly controlled diabetes was addressed, though she was hesitant to adjust her insulin  regimen despite education on the importance of glycemic control for wound healing.  Her hemoglobin A1c was noted to be 12.2, indicating significant hyperglycemia. She also experienced normocytic anemia, likely related to her acute infection, and was managed with supportive care.  Phyllis Pittman's discharge plan included continuation of IV ceftriaxone  therapy until 08/12/2024, with weekly monitoring of CBC and CMP. She  declined home health services, preferring to manage wound care independently and receive antibiotics at the infusion center. Her discharge was on 07/07/2024, with follow-up arrangements made for outpatient care.   Problem List as of 07/07/2024 Reviewed: 07/01/2024  4:49 PM by Viktoria Pouch, MD    Type 2 diabetes mellitus with diabetic polyneuropathy, with long-term current use of insulin  (HCC)   Diabetic polyneuropathy associated with type 2 diabetes mellitus (HCC)   Hyperlipidemia   Current use of insulin  (HCC)   Overweight (BMI 25.0-29.9)   Gastroesophageal reflux disease without esophagitis   Last Assessment & Plan 06/30/2024 Hospital Encounter Written 07/06/2024  4:50 PM by Dorise Donnice Hammers, MD  -- Continue PPI      Myopia of both eyes   Alopecia   Hirsutism   Chronic migraine without aura, not intractable   Uncontrolled type 2 diabetes mellitus with hyperglycemia, with long-term current use of insulin  (HCC)   Type 2 diabetes mellitus with hyperglycemia, with long-term current use of insulin  (HCC)   Irritable bowel syndrome with both constipation and diarrhea   OSA (obstructive sleep apnea)   Cigarette nicotine dependence without complication   Vitreous floaters of both eyes   Abnormal cervical Papanicolaou smear   Hyponatremia   History of transient ischemic attack   Moderate episode of recurrent major depressive disorder (HCC)   Cervical radiculopathy   History of renal calculi   Migraine headache   Occlusion and stenosis of precerebral artery   Dizziness   Vision abnormalities   Orthostatic headache   Grief   Anxiety   Whole body pain   Anxiety and depression   Fibromyalgia   Neuropathic pain   Chronic pain of left knee   Medication management   Fire ant bite                                              The patient's chronic medical conditions were treated accordingly per the patient's home medication regimen except as noted in the plan above and in  the medication list below.    Discharge Condition:   Disposition: Patient discharged to Home or Self Care in good condition. @WHPROCDISCHARGE @  Wound/Incision Care Instructions:   Photo of wound/incision:   Physical Exam at Discharge   BP 132/85 (BP Location: Left arm, Patient Position: Lying)   Pulse 85   Temp 98.2 F (36.8 C) (Oral)   Resp 20   Ht 1.727 m (5' 8)   Wt 69.1 kg (152 lb 5.4 oz)   LMP 06/11/2024 (Approximate)   SpO2 98%   BMI 23.16 kg/m  At the time of discharge, the patient was seen and examined by the attending physician, and was deemed appropriate for discharge. General Appearance:  alert, appears stated age, and cooperative Lungs:   clear to auscultation bilaterally Breasts:   normal appearance, no masses or tenderness Heart:   regular rate and rhythm, S1, S2 normal, no murmur, click, rub or gallop Extremities:   extremities normal, atraumatic, no cyanosis or edema Left foot is bandaged. Fall Risk Status:    0-24 Low Risk, >  24 High Risk   Discharge Medications:      Significant Diagnostic Tests:    LABS:  Lab Results  Component Value Date   WBC 13.45 (H) 07/07/2024   HGB 9.8 (L) 07/07/2024   HCT 30.0 (L) 07/07/2024   PLT 636 (H) 07/07/2024   ALT 6 (L) 06/30/2024   AST 9 (L) 06/30/2024   NA 133 (L) 07/07/2024   K 4.2 07/07/2024   CL 96 (L) 07/07/2024   CREATININE 0.43 (L) 07/07/2024   BUN 8 07/07/2024   CO2 31 07/07/2024   TSH 1.196 02/28/2024   HGBA1C 12.2 (H) 07/01/2024    PATHOLOGY:  IMAGING:  TESTS:  CULTURES:   Surgeries/Procedures:   Procedure(s) (LRB): AMPUTATION TRANSMETATARSAL (Left) Other procedures performed:   Consults:   IP CONSULT TO PODIATRY IP CONSULT TO HOSPITALIST IP CONSULT TO INFECTIOUS DISEASES   Follow-up Appointments:     Future Appointments  Date Time Provider Department Center  07/08/2024  2:45 PM Geofm Almarie Barter, MD [facility] URO [facility] [address]  08/05/2024 10:10 AM Autumn Reggy Joshua [facility] END CN None  11/20/2024  1:45 PM Suzen Rana McPeeks, PA-C [facility] GEN DE [facility] [address]      Appointments which have been scheduled for you    Jul 08, 2024 2:45 PM URGENT CONSULT with Geofm Almarie Barter, MD [facility]  - UROLOGY [place] ([facility] [address] - [place]) [address] [place] [address] [phone]     Aug 05, 2024 10:10 AM Office Visit with Lionel Reggy Joshua, PA-C [facility]  - Endocrinology [place] (--) [address] [place] [address] [phone]  Please arrive 15 minutes prior to your scheduled visit.     Nov 20, 2024 1:45 PM Consult with Suzen Rana Guerin, PA-C [facility]  - Dermatology [place] [facility] [phone] - [facility]) [address] [place] [address] [phone]  Please arrive 15 minutes prior to your scheduled appointment.           Contact information during this hospitalization (Please re-verify post-discharge): PCP: Latrelle Bunkers, MD PCP Phone: [phone]       @PTCONTACTS @  Medical History[1] Patient Active Problem List   Diagnosis Date Noted  . Osteomyelitis    (CMD) 07/06/2024  . Abnormality of gait and mobility 07/03/2024  . Normocytic anemia 07/03/2024  . Impaired mobility and ADLs 07/03/2024  . Osteomyelitis of ankle or foot, left, acute    (CMD) 07/02/2024  . Primary hypertension 07/02/2024  . Primary insomnia 07/02/2024  . 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 Diabetic infection of left foot    (CMD) 06/30/2024  . Fire ant bite 09/18/2023  . Fibromyalgia 10/26/2022  . Neuropathic pain 10/26/2022  . Chronic pain of left knee 10/26/2022  . Medication management 10/26/2022  . Anxiety and depression 08/12/2021  . Whole body pain 05/25/2021  . Grief 02/17/2020  . Anxiety 02/17/2020  . Dizziness 02/08/2020  . Vision  abnormalities 02/08/2020  . Orthostatic headache 02/08/2020  . Migraine headache 11/16/2019  . Occlusion and stenosis of precerebral artery 11/16/2019  . Moderate episode of recurrent major depressive disorder (HCC) 10/17/2017  . History of transient ischemic attack 09/18/2017  . Abnormal cervical Papanicolaou smear 08/16/2017  . Hyponatremia 08/16/2017  . Vitreous floaters of both eyes 01/24/2017  . OSA (obstructive sleep apnea) 11/14/2016  . Cigarette nicotine  dependence without complication 11/14/2016  . Irritable bowel syndrome with both constipation and diarrhea 05/31/2016  . Type 2 diabetes mellitus with hyperglycemia, with long-term current use of insulin  (HCC) 03/06/2016  . Uncontrolled type 2 diabetes mellitus with hyperglycemia, with long-term current use of insulin  (HCC) 02/28/2016  . Type 2 diabetes mellitus with diabetic polyneuropathy, with long-term current use of insulin  (HCC) 01/28/2016  . Diabetic polyneuropathy associated with type 2 diabetes mellitus (HCC) 01/28/2016  . Hyperlipidemia 01/28/2016  . Current use of insulin  (HCC) 01/28/2016  . Overweight (BMI 25.0-29.9) 01/28/2016  . Gastroesophageal reflux disease without esophagitis 01/28/2016  . Myopia of both eyes 01/28/2016  . Alopecia 01/28/2016  . Hirsutism 01/28/2016  . Chronic migraine without aura, not intractable 01/28/2016  . History of renal calculi 01/13/2016  . Cervical radiculopathy 10/28/2015    Time spent on discharge: 32 minutes.       [1] Past Medical History: Diagnosis Date  . Acid reflux 01/28/2016  . Alopecia 01/28/2016  . Asthma (CMD)   . Benign essential hypertension 01/28/2016  . BMI 38.0-38.9,adult 01/28/2016  . Chronic migraine without aura, not intractable 01/28/2016  . Current use of insulin     (CMD) 01/28/2016  . Elevated prolactin level 01/28/2016  . Heart block   . High cholesterol   . Hirsutism 01/28/2016  . Hyperlipidemia 01/28/2016  . Hypertension with goal to be  determined   . Kidney stone   . Latex allergy   . Myopia of both eyes 01/28/2016  . Obesity 01/28/2016  . Paresthesia of arm 01/28/2016   Possible CTS and also having ulnar neuropathy sx. Will consider further workup/PNCV with neurologist at next visit 2 weeks. Restart wrist brace use.  . Polyneuropathy in diabetes    (CMD) 01/28/2016  . Renal abscess   . Sebaceous cyst of skin of right breast 01/28/2016  . Sleep apnea   . Stroke    (CMD)    mini strokes  . Tennis elbow 01/28/2016  . Type 2 diabetes, uncontrolled, with neuropathy 01/28/2016

## 2024-07-10 ENCOUNTER — Telehealth: Payer: Self-pay | Admitting: *Deleted

## 2024-07-10 ENCOUNTER — Telehealth: Payer: Self-pay

## 2024-07-10 NOTE — Transitions of Care (Post Inpatient/ED Visit) (Signed)
   07/10/2024  Name: Phyllis Pittman MRN: [id number] DOB: 12-03-1976  Today's TOC FU Call Status: Today's TOC FU Call Status:: Unsuccessful Call (1st Attempt) Unsuccessful Call (1st Attempt) Date: 07/10/24  Attempted to reach the patient regarding the most recent Inpatient/ED visit.  Follow Up Plan: Additional outreach attempts will be made to reach the patient to complete the Transitions of Care (Post Inpatient/ED visit) call.   Andrea Dimes RN, BSN [place]  [facility] RN Care Manager [phone]

## 2024-07-10 NOTE — Transitions of Care (Post Inpatient/ED Visit) (Signed)
   07/10/2024  Name: Phyllis Pittman MRN: [id number] DOB: [birth year]  Today's TOC FU Call Status: Today's TOC FU Call Status:: Unsuccessful Call (2nd Attempt) Unsuccessful Call (1st Attempt) Date: 07/10/24 Unsuccessful Call (2nd Attempt) Date: 07/10/24 (326 pm)  Attempted to reach the patient regarding the most recent Inpatient/ED visit.  Follow Up Plan: Additional outreach attempts will be made to reach the patient to complete the Transitions of Care (Post Inpatient/ED visit) call.    Bing Edison MSN, RN RN Case Sales executive Health  [facility] Office Hours M-F [phone] Direct Dial: [phone] Main Phone [phone]  Fax: [phone] [email or web address]

## 2024-07-11 DIAGNOSIS — M86172 Other acute osteomyelitis, left ankle and foot: Secondary | ICD-10-CM | POA: Diagnosis not present

## 2024-07-12 DIAGNOSIS — M86172 Other acute osteomyelitis, left ankle and foot: Secondary | ICD-10-CM | POA: Diagnosis not present

## 2024-07-13 ENCOUNTER — Telehealth: Payer: Self-pay | Admitting: *Deleted

## 2024-07-13 DIAGNOSIS — M86172 Other acute osteomyelitis, left ankle and foot: Secondary | ICD-10-CM | POA: Diagnosis not present

## 2024-07-13 NOTE — Transitions of Care (Post Inpatient/ED Visit) (Signed)
   07/13/2024  Name: Phyllis Pittman MRN: [id number] DOB: 1976/05/03  Today's TOC FU Call Status: Today's TOC FU Call Status:: Unsuccessful Call (3rd Attempt) Unsuccessful Call (3rd Attempt) Date: 07/13/24  Attempted to reach the patient regarding the most recent Inpatient/ED visit.  Follow Up Plan: No further outreach attempts will be made at this time. We have been unable to contact the patient.  Andrea Dimes RN, BSN [place]  [facility] RN Care Manager [phone]

## 2024-07-14 DIAGNOSIS — S98132A Complete traumatic amputation of one left lesser toe, initial encounter: Secondary | ICD-10-CM | POA: Diagnosis not present

## 2024-07-14 DIAGNOSIS — M726 Necrotizing fasciitis: Secondary | ICD-10-CM | POA: Diagnosis not present

## 2024-07-14 DIAGNOSIS — E11628 Type 2 diabetes mellitus with other skin complications: Secondary | ICD-10-CM | POA: Diagnosis not present

## 2024-07-14 DIAGNOSIS — M86172 Other acute osteomyelitis, left ankle and foot: Secondary | ICD-10-CM | POA: Diagnosis not present

## 2024-07-14 DIAGNOSIS — L089 Local infection of the skin and subcutaneous tissue, unspecified: Secondary | ICD-10-CM | POA: Diagnosis not present

## 2024-07-15 DIAGNOSIS — M86172 Other acute osteomyelitis, left ankle and foot: Secondary | ICD-10-CM | POA: Diagnosis not present

## 2024-07-16 DIAGNOSIS — M86172 Other acute osteomyelitis, left ankle and foot: Secondary | ICD-10-CM | POA: Diagnosis not present

## 2024-07-17 DIAGNOSIS — M86172 Other acute osteomyelitis, left ankle and foot: Secondary | ICD-10-CM | POA: Diagnosis not present

## 2024-07-19 DIAGNOSIS — M86172 Other acute osteomyelitis, left ankle and foot: Secondary | ICD-10-CM | POA: Diagnosis not present

## 2024-07-20 DIAGNOSIS — M86172 Other acute osteomyelitis, left ankle and foot: Secondary | ICD-10-CM | POA: Diagnosis not present

## 2024-07-21 DIAGNOSIS — M86172 Other acute osteomyelitis, left ankle and foot: Secondary | ICD-10-CM | POA: Diagnosis not present

## 2024-07-22 DIAGNOSIS — M86172 Other acute osteomyelitis, left ankle and foot: Secondary | ICD-10-CM | POA: Diagnosis not present

## 2024-07-23 DIAGNOSIS — M726 Necrotizing fasciitis: Secondary | ICD-10-CM | POA: Diagnosis not present

## 2024-07-23 DIAGNOSIS — M86172 Other acute osteomyelitis, left ankle and foot: Secondary | ICD-10-CM | POA: Diagnosis not present

## 2024-07-23 DIAGNOSIS — L089 Local infection of the skin and subcutaneous tissue, unspecified: Secondary | ICD-10-CM | POA: Diagnosis not present

## 2024-07-23 DIAGNOSIS — R2681 Unsteadiness on feet: Secondary | ICD-10-CM | POA: Diagnosis not present

## 2024-07-23 DIAGNOSIS — E11628 Type 2 diabetes mellitus with other skin complications: Secondary | ICD-10-CM | POA: Diagnosis not present

## 2024-07-23 DIAGNOSIS — S98132A Complete traumatic amputation of one left lesser toe, initial encounter: Secondary | ICD-10-CM | POA: Diagnosis not present

## 2024-07-24 DIAGNOSIS — M86172 Other acute osteomyelitis, left ankle and foot: Secondary | ICD-10-CM | POA: Diagnosis not present

## 2024-07-25 DIAGNOSIS — M86172 Other acute osteomyelitis, left ankle and foot: Secondary | ICD-10-CM | POA: Diagnosis not present

## 2024-07-26 DIAGNOSIS — M86172 Other acute osteomyelitis, left ankle and foot: Secondary | ICD-10-CM | POA: Diagnosis not present

## 2024-07-27 DIAGNOSIS — M86172 Other acute osteomyelitis, left ankle and foot: Secondary | ICD-10-CM | POA: Diagnosis not present

## 2024-07-27 DIAGNOSIS — S91302S Unspecified open wound, left foot, sequela: Secondary | ICD-10-CM | POA: Diagnosis not present

## 2024-07-27 DIAGNOSIS — Z89432 Acquired absence of left foot: Secondary | ICD-10-CM | POA: Diagnosis not present

## 2024-07-28 DIAGNOSIS — M86172 Other acute osteomyelitis, left ankle and foot: Secondary | ICD-10-CM | POA: Diagnosis not present

## 2024-07-30 DIAGNOSIS — M86172 Other acute osteomyelitis, left ankle and foot: Secondary | ICD-10-CM | POA: Diagnosis not present

## 2024-07-31 DIAGNOSIS — M86172 Other acute osteomyelitis, left ankle and foot: Secondary | ICD-10-CM | POA: Diagnosis not present

## 2024-08-01 DIAGNOSIS — M86172 Other acute osteomyelitis, left ankle and foot: Secondary | ICD-10-CM | POA: Diagnosis not present

## 2024-08-01 DIAGNOSIS — R2681 Unsteadiness on feet: Secondary | ICD-10-CM | POA: Diagnosis not present

## 2024-08-01 DIAGNOSIS — S98132A Complete traumatic amputation of one left lesser toe, initial encounter: Secondary | ICD-10-CM | POA: Diagnosis not present

## 2024-08-02 DIAGNOSIS — M86172 Other acute osteomyelitis, left ankle and foot: Secondary | ICD-10-CM | POA: Diagnosis not present

## 2024-08-03 DIAGNOSIS — M86172 Other acute osteomyelitis, left ankle and foot: Secondary | ICD-10-CM | POA: Diagnosis not present

## 2024-08-04 DIAGNOSIS — M86172 Other acute osteomyelitis, left ankle and foot: Secondary | ICD-10-CM | POA: Diagnosis not present

## 2024-08-05 DIAGNOSIS — G2581 Restless legs syndrome: Secondary | ICD-10-CM | POA: Diagnosis not present

## 2024-08-05 DIAGNOSIS — M726 Necrotizing fasciitis: Secondary | ICD-10-CM | POA: Diagnosis not present

## 2024-08-05 DIAGNOSIS — S98132A Complete traumatic amputation of one left lesser toe, initial encounter: Secondary | ICD-10-CM | POA: Diagnosis not present

## 2024-08-05 DIAGNOSIS — L089 Local infection of the skin and subcutaneous tissue, unspecified: Secondary | ICD-10-CM | POA: Diagnosis not present

## 2024-08-05 DIAGNOSIS — M86172 Other acute osteomyelitis, left ankle and foot: Secondary | ICD-10-CM | POA: Diagnosis not present

## 2024-08-06 DIAGNOSIS — M86172 Other acute osteomyelitis, left ankle and foot: Secondary | ICD-10-CM | POA: Diagnosis not present

## 2024-08-07 DIAGNOSIS — M86172 Other acute osteomyelitis, left ankle and foot: Secondary | ICD-10-CM | POA: Diagnosis not present

## 2024-08-08 DIAGNOSIS — M86172 Other acute osteomyelitis, left ankle and foot: Secondary | ICD-10-CM | POA: Diagnosis not present

## 2024-08-10 DIAGNOSIS — M86172 Other acute osteomyelitis, left ankle and foot: Secondary | ICD-10-CM | POA: Diagnosis not present

## 2024-08-11 DIAGNOSIS — Z792 Long term (current) use of antibiotics: Secondary | ICD-10-CM | POA: Diagnosis not present

## 2024-08-11 DIAGNOSIS — M86172 Other acute osteomyelitis, left ankle and foot: Secondary | ICD-10-CM | POA: Diagnosis not present

## 2024-08-12 DIAGNOSIS — E1165 Type 2 diabetes mellitus with hyperglycemia: Secondary | ICD-10-CM | POA: Diagnosis not present

## 2024-08-12 DIAGNOSIS — Z794 Long term (current) use of insulin: Secondary | ICD-10-CM | POA: Diagnosis not present

## 2024-08-12 DIAGNOSIS — M86172 Other acute osteomyelitis, left ankle and foot: Secondary | ICD-10-CM | POA: Diagnosis not present

## 2024-08-13 DIAGNOSIS — M86172 Other acute osteomyelitis, left ankle and foot: Secondary | ICD-10-CM | POA: Diagnosis not present

## 2024-08-14 DIAGNOSIS — M86172 Other acute osteomyelitis, left ankle and foot: Secondary | ICD-10-CM | POA: Diagnosis not present

## 2024-08-15 DIAGNOSIS — M86172 Other acute osteomyelitis, left ankle and foot: Secondary | ICD-10-CM | POA: Diagnosis not present

## 2024-08-16 DIAGNOSIS — M86172 Other acute osteomyelitis, left ankle and foot: Secondary | ICD-10-CM | POA: Diagnosis not present

## 2024-08-25 DIAGNOSIS — R030 Elevated blood-pressure reading, without diagnosis of hypertension: Secondary | ICD-10-CM | POA: Diagnosis not present

## 2024-08-25 DIAGNOSIS — L089 Local infection of the skin and subcutaneous tissue, unspecified: Secondary | ICD-10-CM | POA: Diagnosis not present

## 2024-08-25 DIAGNOSIS — S98132A Complete traumatic amputation of one left lesser toe, initial encounter: Secondary | ICD-10-CM | POA: Diagnosis not present

## 2024-08-25 DIAGNOSIS — M726 Necrotizing fasciitis: Secondary | ICD-10-CM | POA: Diagnosis not present

## 2024-08-25 DIAGNOSIS — G2581 Restless legs syndrome: Secondary | ICD-10-CM | POA: Diagnosis not present

## 2024-08-28 DIAGNOSIS — Z89432 Acquired absence of left foot: Secondary | ICD-10-CM | POA: Diagnosis not present

## 2024-08-31 DIAGNOSIS — G4700 Insomnia, unspecified: Secondary | ICD-10-CM | POA: Diagnosis not present

## 2024-08-31 DIAGNOSIS — M7552 Bursitis of left shoulder: Secondary | ICD-10-CM | POA: Diagnosis not present

## 2024-08-31 DIAGNOSIS — M5137 Other intervertebral disc degeneration, lumbosacral region with discogenic back pain only: Secondary | ICD-10-CM | POA: Diagnosis not present

## 2024-08-31 DIAGNOSIS — M792 Neuralgia and neuritis, unspecified: Secondary | ICD-10-CM | POA: Diagnosis not present

## 2024-08-31 DIAGNOSIS — M7551 Bursitis of right shoulder: Secondary | ICD-10-CM | POA: Diagnosis not present

## 2024-08-31 DIAGNOSIS — G2581 Restless legs syndrome: Secondary | ICD-10-CM | POA: Diagnosis not present

## 2024-09-14 DIAGNOSIS — M726 Necrotizing fasciitis: Secondary | ICD-10-CM | POA: Diagnosis not present

## 2024-09-14 DIAGNOSIS — G2581 Restless legs syndrome: Secondary | ICD-10-CM | POA: Diagnosis not present

## 2024-09-14 DIAGNOSIS — R519 Headache, unspecified: Secondary | ICD-10-CM | POA: Diagnosis not present

## 2024-09-14 DIAGNOSIS — Z6824 Body mass index (BMI) 24.0-24.9, adult: Secondary | ICD-10-CM | POA: Diagnosis not present

## 2024-09-14 DIAGNOSIS — L089 Local infection of the skin and subcutaneous tissue, unspecified: Secondary | ICD-10-CM | POA: Diagnosis not present

## 2024-09-16 DIAGNOSIS — E11621 Type 2 diabetes mellitus with foot ulcer: Secondary | ICD-10-CM | POA: Diagnosis not present

## 2024-09-16 DIAGNOSIS — Z89422 Acquired absence of other left toe(s): Secondary | ICD-10-CM | POA: Diagnosis not present

## 2024-09-16 DIAGNOSIS — L97423 Non-pressure chronic ulcer of left heel and midfoot with necrosis of muscle: Secondary | ICD-10-CM | POA: Diagnosis not present

## 2024-09-23 DIAGNOSIS — E11621 Type 2 diabetes mellitus with foot ulcer: Secondary | ICD-10-CM | POA: Diagnosis not present

## 2024-09-23 DIAGNOSIS — L97522 Non-pressure chronic ulcer of other part of left foot with fat layer exposed: Secondary | ICD-10-CM | POA: Diagnosis not present

## 2024-09-30 DIAGNOSIS — L97423 Non-pressure chronic ulcer of left heel and midfoot with necrosis of muscle: Secondary | ICD-10-CM | POA: Diagnosis not present

## 2024-09-30 DIAGNOSIS — E11621 Type 2 diabetes mellitus with foot ulcer: Secondary | ICD-10-CM | POA: Diagnosis not present

## 2024-10-07 DIAGNOSIS — E11621 Type 2 diabetes mellitus with foot ulcer: Secondary | ICD-10-CM | POA: Diagnosis not present

## 2024-10-07 DIAGNOSIS — L97425 Non-pressure chronic ulcer of left heel and midfoot with muscle involvement without evidence of necrosis: Secondary | ICD-10-CM | POA: Diagnosis not present

## 2024-10-07 DIAGNOSIS — L97512 Non-pressure chronic ulcer of other part of right foot with fat layer exposed: Secondary | ICD-10-CM | POA: Diagnosis not present

## 2024-10-14 DIAGNOSIS — L97425 Non-pressure chronic ulcer of left heel and midfoot with muscle involvement without evidence of necrosis: Secondary | ICD-10-CM | POA: Diagnosis not present

## 2024-10-14 DIAGNOSIS — E11621 Type 2 diabetes mellitus with foot ulcer: Secondary | ICD-10-CM | POA: Diagnosis not present

## 2024-10-14 NOTE — Progress Notes (Signed)
 Dressing applied per providers orders.  Patient tolerated procedure well.  Patient given AVS.

## 2024-10-14 NOTE — Progress Notes (Addendum)
 Debridement Amputation Anterior;Left Toe D5, fifth  Performed by: Arlean Almarie Cage, MD Authorized by: Arlean Almarie Cage, MD  Associated wounds:  Wound 07/01/24 Amputation Toe D5, fifth Anterior;Left  Performed By:  Physician Debridement Type:  Debridement Goals of Debridement:  Promote wound healing Severity of Tissue Pre-Debridement:  Fat Layer Exposed Level of Consciousness Pre-Procedure:  Awake and Alert Pre-Procedure Verification/Time-Out Taken:  Yes Start Time:  10/14/2024 9:29 AM Tissue and Other Material Debrided:  Slough, Subcutaneous and Biofilm Excisional Method:  Excisional Level:  Subcutaneous Tissue Instrument:  Curette Specimen:  None Bleeding:  Moderate Hemostasis Achieved:  Pressure Procedural Pain:  0 Post Procedural Pain:  0 Response to Treatment:  Procedure was Tolerated Well Level of Consciousness Post-Procedure:  Awake and Alert Pre-debridement measurements:    Time taken:  10/14/2024 9:12 AM   Length (cm):  2.5   Width (cm):  5.5   Depth (cm):  0.2 Post-debridement measurements:    Time taken:  10/14/2024 9:12 AM   Length (cm):  2.5   Width (cm):  5.5   Depth (cm):  0.2 Total Area Debrided:    Length (cm):  2.5   Width (cm):  5.5   Surface Area (cm2):  13.75 Character of Wound / Ulcer Post Debridement:  Stable Severity of Tissue Post Debridement:  Fat Layer Exposed Post Procedure Diagnosis:  Same as Pre-Procedure Wound Dressing:  Other (Comment)

## 2024-10-14 NOTE — Progress Notes (Addendum)
 WOUND CARE CONSULTATION REPORT - OUTPATIENT CLINIC  [facility] [address] - [address],  [place], [place]   Referred by: Donavan Burkitt, MD  Chief Complaint: L foot wound   Relevant past medical history includes:  Medical History[1]   Lab Results  Component Value Date   HGBA1C 12.2 (H) 07/01/2024   HGBA1C 13.6 (A) 01/18/2023   HGBA1C 13.9 (A) 06/30/2021   HGBA1C 11.9 (A) 02/11/2020   ALBUMIN 3.4 (L) 08/10/2024   ALBUMIN 3.5 08/03/2024   ALBUMIN 3.6 07/27/2024   ALBUMIN 3.1 (L) 07/20/2024   PLT 329 08/10/2024   PLT 402 08/03/2024   PLT 448 07/27/2024   PLT 351 07/20/2024   WBC 11.90 (H) 08/10/2024   WBC 11.12 (H) 08/03/2024   WBC 12.55 (H) 07/27/2024   WBC 12.12 (H) 07/20/2024   Initial visit /Background / obtained on 09/16/24 :  48 year old female presents today for evaluation of left foot wound.  Patient has a history of and infection and osteo, status post debridements and amputations in July.  She underwent a round of IV antibiotics with infectious disease.  Currently she is at home treating her wounds with Vashe or saline.  She notes hypersensitivity in her foot.  Medical history is notable for poorly controlled diabetes with most recent hemoglobin A1c of 12, hypertension, mini stroke.  Patient does admit to using tobacco.  She is a poor historian.  She has a history of diffuse chronic wounds including upper and lower extremities.  Hospital notes, ID notes, podiatry notes, labs, imaging personally reviewed  Interval history/progress:  10/8: 1 week follow-up.  Tolerated wrap.  Has some concerns about pain/anxiety.  10/15: 1 week follow-up.  Reports some pain in soft cast.  10/22: 1wk f/u.  Patient reports new wounds on right foot.  She also states that she had a fever of 101 last night.  10/29: 1 week follow-up.  Patient did not go to the ER as advised last visit.  She left  her Vashe wet-to-dry dressings in place since last week.  Today in clinic, she is lethargic.   Vitals:   10/14/24 0905  BP: 105/67  Pulse: 103  Resp: 16  Temp: 97.7 F (36.5 C)     Exam: Pt is AA in NAD. Oriented x 3 Wound(s) exam:  Left foot: plantar wound wrapping around to the dorsal foot; progress continues to be made, wounds are small shallow clean.   --Wound debrided with curette down to and including subcutaneous tissue..  Bleeding controlled with pressure.  Right foot: Significantly worse.  Extensive odor.  Small toe is necrotic, fourth toe is dusky.  Dark eschar on both plantar and dorsal aspect of foot now.  Erythema and swelling extending up to ankle.  Wound 07/01/24 Amputation Toe D5, fifth Anterior;Left (Active)  Date First Assessed/Time First Assessed: 07/01/24 1736   Pre-Existing Wound: No  Primary Wound Type: Amputation  Location: Toe D5, fifth  Wound Location Orientation: Anterior;Left  Wound Description (Comments): partial 5th ray amputation    Assessments 07/01/2024  5:57 PM 10/14/2024  9:12 AM  Wound Image     Site Assessment Covered by dressing Slough  Peri-Wound Assessment Clean;Dry;Intact Erythema  Drainage Amount None Moderate  Drainage Description -- Serosanguineous  Wound Odor -- Foul  Dressing Post-operative dressing --  Dressing Status Clean;Dry;Intact --  Treatments -- Cleansed  Wound Cleanser -- Antimicrobial solution  Wound Length (cm) -- 2.5 cm  Wound Width (cm) --  5.5 cm  Wound Depth (cm) -- 0.2 cm  Wound Volume (cm^3) -- 1.44 cm^3  Wound Healing % -- 75  Non-staged Wound Description -- Full thickness     Active Orders  Date Order Priority Status Authorizing Provider  10/14/24 0935 Wound Care Routine Active Arlean Almarie Cage, MD  10/14/24 [phone] Debridement Amputation Anterior;Left Toe D5, fifth Routine Active Arlean Almarie Cage, MD  10/07/24 1200 Wound Care Routine Active Arlean Almarie Cage, MD  09/30/24 1029 Wound Care  Amputation Anterior;Left Toe D5, fifth Routine Active Arlean Almarie Cage, MD    - Wound cleansing::    Keep dry    - Dressing change frequency::    Do not change dressing for an entire week    - Primary wound dressing::    Other (Specify) (gentian violet,prisma collagen)    - Secondary wound dressing::    Other (Specify) (optilock)    - Additional Orders/Instructions::    Follow nutritous diet    - Follow Up Appointment::    Return to clinic in 1 week  09/23/24 1516 Wound Care Amputation Anterior;Left Toe D5, fifth Routine Active Arlean Almarie Cage, MD    - Wound cleansing::    Keep dry    - Dressing change frequency::    Do not change dressing for an entire week    - Primary wound dressing::    Other (Specify) (gentian violet,primsa collagen)    - Secondary wound dressing::    Other (Specify) (optilock)    - Additional Orders/Instructions::    Follow nutritous diet    - Follow Up Appointment::    Return to clinic in 1 week  09/16/24 1255 Wound Care Amputation Anterior;Left Toe D5, fifth Routine Active Arlean Almarie Cage, MD    - Follow Up Appointment::    Nurse Visit (prn)     Inactive Orders  Date Order Priority Status Authorizing Provider  10/07/24 1026 Debridement Amputation Anterior;Left Toe D5, fifth Routine Completed Arlean Almarie Cage, MD  09/30/24 1006 Debridement Amputation Anterior;Left Toe D5, fifth Routine Completed Arlean Almarie Cage, MD  09/23/24 1502 Debridement Amputation Anterior;Left Toe D5, fifth Routine Completed Arlean Almarie Cage, MD  09/16/24 1118 Debridement Amputation Anterior;Left Toe D5, fifth Routine Completed Arlean Almarie Cage, MD  07/04/24 1359 Wound care Amputation Anterior;Left Toe D5, fifth Routine Discontinued Dekarlos Megail Dial, DPM     Wound 10/07/24 Diabetic Ulcer Toe D5, fifth Anterior;Right (Active)  Date First Assessed/Time First Assessed: 10/07/24 1023   Pre-Existing Wound: Yes  Primary Wound Type: Diabetic Ulcer   Location: Toe D5, fifth  Wound Location Orientation: Anterior;Right    Assessments 10/07/2024 10:07 AM 10/14/2024  9:15 AM  Wound Image      Site Assessment Necrotic tissue, slough Necrotic tissue, eschar  Peri-Wound Assessment Clean;Intact;Dry Fragile  Drainage Amount Moderate Moderate  Drainage Description Serosanguineous;Yellow Serosanguineous  Wound Odor None Foul  Treatments -- Cleansed  Wound Cleanser -- Antimicrobial solution  Wound Length (cm) 1 cm 3.5 cm  Wound Width (cm) 1 cm 6 cm  Wound Surface Area (cm^2) 0.79 cm^2 16.49 cm^2  Wound Depth (cm) 0.1 cm 0.1 cm  Wound Volume (cm^3) 0.052 cm^3 1.1 cm^3  Wound Healing % -- -2015  Non-staged Wound Description Full thickness Full thickness     Active Orders  Date Order Priority Status Authorizing Provider  10/14/24 0935 Wound Care Routine Active Arlean Almarie Cage, MD  10/07/24 1200 Wound Care Routine Active Arlean Almarie Cage, MD     Wound 10/07/24 Diabetic Ulcer Foot Right;Lateral (  Active)  Date First Assessed/Time First Assessed: 10/07/24 1023   Pre-Existing Wound: Yes  Primary Wound Type: Diabetic Ulcer  Location: Foot  Wound Location Orientation: Right;Lateral    Assessments 10/07/2024 10:07 AM 10/14/2024  9:14 AM  Wound Image      Site Assessment Granulation tissue;Slough Necrotic tissue, eschar  Peri-Wound Assessment Clean;Dry;Intact Erythema;Fragile  Diabetic Ulcer Wagner Grade Grade 1 --  Drainage Amount Moderate Moderate  Drainage Description Serosanguineous;Yellow Serosanguineous  Wound Odor None Foul  Treatments -- Cleansed  Wound Cleanser -- Antimicrobial solution  Wound Length (cm) 2.5 cm 2.5 cm  Wound Width (cm) 2 cm 3 cm  Wound Surface Area (cm^2) -- 5.89 cm^2  Wound Depth (cm) 3 cm 2.5 cm  Wound Volume (cm^3) 7.854 cm^3 9.817 cm^3  Wound Healing % -- -25  Non-staged Wound Description Full thickness Full thickness     Active Orders  Date Order Priority Status Authorizing Provider  10/14/24  0935 Wound Care Routine Active Arlean Almarie Cage, MD  10/07/24 1200 Wound Care Routine Active Arlean Almarie Cage, MD     Inactive Orders  Date Order Priority Status Authorizing Provider  10/07/24 1029 Debridement Diabetic Ulcer Right;Lateral Foot Routine Completed Arlean Almarie Cage, MD     Wound 10/07/24 Diabetic Ulcer Foot Anterior;Right (Active)  Date First Assessed/Time First Assessed: 10/07/24 1023   Pre-Existing Wound: Yes  Primary Wound Type: Diabetic Ulcer  Location: Foot  Wound Location Orientation: Anterior;Right    Assessments 10/07/2024 10:07 AM 10/14/2024  9:13 AM  Wound Image      Site Assessment Granulation tissue;Slough Necrotic tissue, eschar  Peri-Wound Assessment Dry Erythema  Diabetic Ulcer Wagner Grade Grade 1 Grade 1  Drainage Amount Moderate Moderate  Drainage Description Serosanguineous;Yellow Serosanguineous  Wound Odor None None;Foul  Treatments -- Cleansed  Wound Cleanser -- Antimicrobial solution  Wound Length (cm) 1.5 cm 11.5 cm  Wound Width (cm) 1 cm 7 cm  Wound Surface Area (cm^2) -- 63.22 cm^2  Wound Depth (cm) 0.2 cm 0.1 cm  Wound Volume (cm^3) 0.157 cm^3 4.215 cm^3  Wound Healing % -- -2585  Non-staged Wound Description Full thickness Full thickness     Active Orders  Date Order Priority Status Authorizing Provider  10/14/24 0935 Wound Care Routine Active Arlean Almarie Cage, MD  10/07/24 1200 Wound Care Routine Active Arlean Almarie Cage, MD     Inactive Orders  Date Order Priority Status Authorizing Provider  10/07/24 1029 Debridement Diabetic Ulcer Anterior;Right Foot Routine Completed Arlean Almarie Cage, MD     Wound 10/14/24 Diabetic Ulcer Leg Right;Lateral;Lower (Active)  Date First Assessed/Time First Assessed: 10/14/24 0917   Pre-Existing Wound: Yes  Primary Wound Type: Diabetic Ulcer  Location: Leg  Wound Location Orientation: Right;Lateral;Lower    Assessments 10/14/2024  9:18 AM  Wound Image    Site  Assessment Red;Granulation tissue;Slough  Peri-Wound Assessment Clean;Intact;Dry;Fragile  Drainage Amount Moderate  Drainage Description Serosanguineous  Wound Odor None  Treatments Cleansed  Wound Cleanser Antimicrobial solution  Wound Length (cm) 2 cm  Wound Width (cm) 1.6 cm  Wound Surface Area (cm^2) 2.51 cm^2  Wound Depth (cm) 0.1 cm  Wound Volume (cm^3) 0.168 cm^3  Non-staged Wound Description Full thickness     Active Orders  Date Order Priority Status Authorizing Provider  10/14/24 0935 Wound Care Routine Active Arlean Almarie Cage, MD      Assessment Left foot wound, post surgical  -Diabetes, poorly controlled  PLAN: - Patient's right foot is necrotic, with concerns of ascending infection and a  septic picture in clinic today with malaise, lethargy, headache, abnormal vitals.  She was advised to go to the emergency room at her last clinic appointment, but did not do so.  Once again, I advised patient to proceed directly to the emergency room now, explaining that her foot is necrotic and this infection appears to be developing into a life-threatening situation.  Patient refused, however was too lethargic to get out of her chair.  At this point, I called EMS to evaluate and transport the patient to the emergency room as she appeared septic and unable to get up.  Patient refused to go with EMS.  Both EMS and I reviewed at length the dangers and risks of her condition.  Patient demonstrated understanding but continued to refuse.  She then proceeded to take a nap in her clinic room, at which point a second EMS crew (who was transporting a different patient) talked with her and reviewed risks, again without success in getting her to the emergency room.  We consulted with our wound clinic director during this encounter, and discussed potentially involving police/security.   After resting approximately 45 minutes, patient got out of her clinic chair and used her walker to independently  leave the clinic.  I and several of my nursing staff expressed our concern for her safety and health, and continued to advise her to head to the emergency room throughout her entire visit from beginning to end.  -The condition of her right foot is beyond wound clinic management, so her follow-up appointment for next week was canceled.  Should she return to wound clinic next week she will be directed to the emergency room.    Orders Placed This Encounter  Procedures  . Debridement Amputation Anterior;Left Toe D5, fifth    This order was created via procedure documentation  . Wound Care    Left and right foot Wounds: Apply Vashe moisten gauze and a dry dressing. Go to the ER ASAP.    Standing Status:   Standing    Number of Occurrences:   1    Next Expected Occurrence:   10/14/2024    Expiration Date:   10/21/2024      Arlean Cage 10/14/24  Disclaimer: Parts of this document has been created using voice recognition software and it may, therefore, contain incidental typographical errors,  grammatical errors, misspellings  with similar sounding words transcribed incorrectly.          [1] Past Medical History: Diagnosis Date  . Acid reflux 01/28/2016  . Alopecia 01/28/2016  . Asthma   . Benign essential hypertension 01/28/2016  . BMI 38.0-38.9,adult 01/28/2016  . Chronic migraine without aura, not intractable 01/28/2016  . Current use of insulin     (CMD) 01/28/2016  . Elevated prolactin level 01/28/2016  . Heart block   . High cholesterol   . Hirsutism 01/28/2016  . Hyperlipidemia 01/28/2016  . Hypertension with goal to be determined   . Kidney stone   . Latex allergy   . Myopia of both eyes 01/28/2016  . Obesity 01/28/2016  . Paresthesia of arm 01/28/2016   Possible CTS and also having ulnar neuropathy sx. Will consider further workup/PNCV with neurologist at next visit 2 weeks. Restart wrist brace use.  . Polyneuropathy in diabetes    (CMD) 01/28/2016  . Renal  abscess   . Sebaceous cyst of skin of right breast 01/28/2016  . Sleep apnea   . Stroke    (CMD)    mini strokes  .  Tennis elbow 01/28/2016  . Type 2 diabetes, uncontrolled, with neuropathy 01/28/2016

## 2024-10-15 NOTE — Telephone Encounter (Signed)
 Patient came in for appointment 10/14/24 and was seen by the provider and was instructed to go to the ER for further treatment, we called EMS to transport patient over there since she drove herself, EMS came out and patient refused to go..she was weak and asked that we turn out the light in the exam room so she could  lay down, we  were asked to contact [employer] to get further advice since we felt like patient was not able to drive, we were told by [employer] that they could not tell the patient that she could not drive, they could come and have her escorted off property and we told them that was not what we were wanting them to do, we were just concerned that she was driving herself with how she was feeling.

## 2024-10-16 ENCOUNTER — Other Ambulatory Visit: Payer: Self-pay

## 2024-10-16 ENCOUNTER — Emergency Department (HOSPITAL_COMMUNITY)

## 2024-10-16 ENCOUNTER — Inpatient Hospital Stay (HOSPITAL_COMMUNITY)

## 2024-10-16 ENCOUNTER — Inpatient Hospital Stay (HOSPITAL_COMMUNITY)
Admission: EM | Admit: 2024-10-16 | Discharge: 2024-10-31 | DRG: 853 | Disposition: A | Discharge status: Skilled Nursing Facility | Source: Ambulatory Visit | Attending: Internal Medicine | Admitting: Internal Medicine

## 2024-10-16 DIAGNOSIS — E1165 Type 2 diabetes mellitus with hyperglycemia: Secondary | ICD-10-CM | POA: Diagnosis not present

## 2024-10-16 DIAGNOSIS — I10 Essential (primary) hypertension: Secondary | ICD-10-CM | POA: Diagnosis present

## 2024-10-16 DIAGNOSIS — M60061 Infective myositis, right lower leg: Secondary | ICD-10-CM | POA: Diagnosis present

## 2024-10-16 DIAGNOSIS — Z6828 Body mass index (BMI) 28.0-28.9, adult: Secondary | ICD-10-CM

## 2024-10-16 DIAGNOSIS — R6520 Severe sepsis without septic shock: Secondary | ICD-10-CM | POA: Diagnosis present

## 2024-10-16 DIAGNOSIS — A09 Infectious gastroenteritis and colitis, unspecified: Secondary | ICD-10-CM | POA: Diagnosis present

## 2024-10-16 DIAGNOSIS — E1169 Type 2 diabetes mellitus with other specified complication: Secondary | ICD-10-CM | POA: Diagnosis present

## 2024-10-16 DIAGNOSIS — A480 Gas gangrene: Secondary | ICD-10-CM | POA: Diagnosis present

## 2024-10-16 DIAGNOSIS — Z9049 Acquired absence of other specified parts of digestive tract: Secondary | ICD-10-CM

## 2024-10-16 DIAGNOSIS — F32A Depression, unspecified: Secondary | ICD-10-CM | POA: Diagnosis present

## 2024-10-16 DIAGNOSIS — L89152 Pressure ulcer of sacral region, stage 2: Secondary | ICD-10-CM | POA: Diagnosis present

## 2024-10-16 DIAGNOSIS — L89302 Pressure ulcer of unspecified buttock, stage 2: Secondary | ICD-10-CM | POA: Diagnosis present

## 2024-10-16 DIAGNOSIS — E1140 Type 2 diabetes mellitus with diabetic neuropathy, unspecified: Secondary | ICD-10-CM | POA: Diagnosis present

## 2024-10-16 DIAGNOSIS — L97519 Non-pressure chronic ulcer of other part of right foot with unspecified severity: Secondary | ICD-10-CM | POA: Diagnosis present

## 2024-10-16 DIAGNOSIS — E1152 Type 2 diabetes mellitus with diabetic peripheral angiopathy with gangrene: Secondary | ICD-10-CM | POA: Diagnosis present

## 2024-10-16 DIAGNOSIS — I96 Gangrene, not elsewhere classified: Secondary | ICD-10-CM | POA: Diagnosis not present

## 2024-10-16 DIAGNOSIS — F419 Anxiety disorder, unspecified: Secondary | ICD-10-CM | POA: Diagnosis present

## 2024-10-16 DIAGNOSIS — M797 Fibromyalgia: Secondary | ICD-10-CM | POA: Diagnosis present

## 2024-10-16 DIAGNOSIS — I951 Orthostatic hypotension: Secondary | ICD-10-CM | POA: Diagnosis present

## 2024-10-16 DIAGNOSIS — R54 Age-related physical debility: Secondary | ICD-10-CM | POA: Diagnosis present

## 2024-10-16 DIAGNOSIS — K580 Irritable bowel syndrome with diarrhea: Secondary | ICD-10-CM | POA: Diagnosis not present

## 2024-10-16 DIAGNOSIS — Z833 Family history of diabetes mellitus: Secondary | ICD-10-CM

## 2024-10-16 DIAGNOSIS — Z885 Allergy status to narcotic agent status: Secondary | ICD-10-CM

## 2024-10-16 DIAGNOSIS — Z794 Long term (current) use of insulin: Secondary | ICD-10-CM

## 2024-10-16 DIAGNOSIS — M542 Cervicalgia: Secondary | ICD-10-CM | POA: Diagnosis present

## 2024-10-16 DIAGNOSIS — L659 Nonscarring hair loss, unspecified: Secondary | ICD-10-CM | POA: Diagnosis present

## 2024-10-16 DIAGNOSIS — K219 Gastro-esophageal reflux disease without esophagitis: Secondary | ICD-10-CM | POA: Diagnosis present

## 2024-10-16 DIAGNOSIS — E8809 Other disorders of plasma-protein metabolism, not elsewhere classified: Secondary | ICD-10-CM | POA: Diagnosis present

## 2024-10-16 DIAGNOSIS — R32 Unspecified urinary incontinence: Secondary | ICD-10-CM | POA: Diagnosis present

## 2024-10-16 DIAGNOSIS — Z7984 Long term (current) use of oral hypoglycemic drugs: Secondary | ICD-10-CM

## 2024-10-16 DIAGNOSIS — M726 Necrotizing fasciitis: Secondary | ICD-10-CM

## 2024-10-16 DIAGNOSIS — Z91040 Latex allergy status: Secondary | ICD-10-CM

## 2024-10-16 DIAGNOSIS — L02611 Cutaneous abscess of right foot: Secondary | ICD-10-CM | POA: Diagnosis present

## 2024-10-16 DIAGNOSIS — B850 Pediculosis due to Pediculus humanus capitis: Secondary | ICD-10-CM | POA: Diagnosis present

## 2024-10-16 DIAGNOSIS — M86171 Other acute osteomyelitis, right ankle and foot: Principal | ICD-10-CM | POA: Diagnosis present

## 2024-10-16 DIAGNOSIS — L03211 Cellulitis of face: Secondary | ICD-10-CM | POA: Diagnosis present

## 2024-10-16 DIAGNOSIS — E11649 Type 2 diabetes mellitus with hypoglycemia without coma: Secondary | ICD-10-CM | POA: Diagnosis not present

## 2024-10-16 DIAGNOSIS — F17210 Nicotine dependence, cigarettes, uncomplicated: Secondary | ICD-10-CM | POA: Diagnosis not present

## 2024-10-16 DIAGNOSIS — M869 Osteomyelitis, unspecified: Secondary | ICD-10-CM | POA: Diagnosis not present

## 2024-10-16 DIAGNOSIS — A414 Sepsis due to anaerobes: Secondary | ICD-10-CM | POA: Diagnosis not present

## 2024-10-16 DIAGNOSIS — R339 Retention of urine, unspecified: Secondary | ICD-10-CM | POA: Diagnosis present

## 2024-10-16 DIAGNOSIS — Z825 Family history of asthma and other chronic lower respiratory diseases: Secondary | ICD-10-CM

## 2024-10-16 DIAGNOSIS — D509 Iron deficiency anemia, unspecified: Secondary | ICD-10-CM | POA: Diagnosis present

## 2024-10-16 DIAGNOSIS — L309 Dermatitis, unspecified: Secondary | ICD-10-CM | POA: Diagnosis not present

## 2024-10-16 DIAGNOSIS — G4733 Obstructive sleep apnea (adult) (pediatric): Secondary | ICD-10-CM | POA: Diagnosis present

## 2024-10-16 DIAGNOSIS — L03115 Cellulitis of right lower limb: Secondary | ICD-10-CM | POA: Diagnosis present

## 2024-10-16 DIAGNOSIS — E669 Obesity, unspecified: Secondary | ICD-10-CM | POA: Diagnosis present

## 2024-10-16 DIAGNOSIS — R7881 Bacteremia: Secondary | ICD-10-CM | POA: Diagnosis not present

## 2024-10-16 DIAGNOSIS — G8929 Other chronic pain: Secondary | ICD-10-CM | POA: Diagnosis present

## 2024-10-16 DIAGNOSIS — Z7982 Long term (current) use of aspirin: Secondary | ICD-10-CM

## 2024-10-16 DIAGNOSIS — G90A Postural orthostatic tachycardia syndrome (POTS): Secondary | ICD-10-CM | POA: Diagnosis present

## 2024-10-16 DIAGNOSIS — A419 Sepsis, unspecified organism: Secondary | ICD-10-CM | POA: Diagnosis not present

## 2024-10-16 DIAGNOSIS — E11621 Type 2 diabetes mellitus with foot ulcer: Secondary | ICD-10-CM | POA: Diagnosis present

## 2024-10-16 DIAGNOSIS — Z8673 Personal history of transient ischemic attack (TIA), and cerebral infarction without residual deficits: Secondary | ICD-10-CM

## 2024-10-16 DIAGNOSIS — E785 Hyperlipidemia, unspecified: Secondary | ICD-10-CM | POA: Diagnosis present

## 2024-10-16 DIAGNOSIS — B852 Pediculosis, unspecified: Secondary | ICD-10-CM | POA: Diagnosis not present

## 2024-10-16 DIAGNOSIS — Z79899 Other long term (current) drug therapy: Secondary | ICD-10-CM

## 2024-10-16 DIAGNOSIS — E871 Hypo-osmolality and hyponatremia: Secondary | ICD-10-CM | POA: Diagnosis present

## 2024-10-16 DIAGNOSIS — M65961 Unspecified synovitis and tenosynovitis, right lower leg: Secondary | ICD-10-CM | POA: Diagnosis present

## 2024-10-16 DIAGNOSIS — E119 Type 2 diabetes mellitus without complications: Secondary | ICD-10-CM | POA: Diagnosis not present

## 2024-10-16 DIAGNOSIS — Z89422 Acquired absence of other left toe(s): Secondary | ICD-10-CM

## 2024-10-16 DIAGNOSIS — M86271 Subacute osteomyelitis, right ankle and foot: Secondary | ICD-10-CM | POA: Diagnosis not present

## 2024-10-16 DIAGNOSIS — Z882 Allergy status to sulfonamides status: Secondary | ICD-10-CM

## 2024-10-16 LAB — CBC WITH DIFFERENTIAL/PLATELET
Abs Immature Granulocytes: 1.27 K/uL — ABNORMAL HIGH (ref 0.00–0.07)
Basophils Absolute: 0.2 K/uL — ABNORMAL HIGH (ref 0.0–0.1)
Basophils Relative: 1 %
Eosinophils Absolute: 0 K/uL (ref 0.0–0.5)
Eosinophils Relative: 0 %
HCT: 35.9 % — ABNORMAL LOW (ref 36.0–46.0)
Hemoglobin: 10.9 g/dL — ABNORMAL LOW (ref 12.0–15.0)
Immature Granulocytes: 4 %
Lymphocytes Relative: 6 %
Lymphs Abs: 1.9 K/uL (ref 0.7–4.0)
MCH: 23.9 pg — ABNORMAL LOW (ref 26.0–34.0)
MCHC: 30.4 g/dL (ref 30.0–36.0)
MCV: 78.6 fL — ABNORMAL LOW (ref 80.0–100.0)
Monocytes Absolute: 1.5 K/uL — ABNORMAL HIGH (ref 0.1–1.0)
Monocytes Relative: 4 %
Neutro Abs: 29.3 K/uL — ABNORMAL HIGH (ref 1.7–7.7)
Neutrophils Relative %: 85 %
Platelets: 552 K/uL — ABNORMAL HIGH (ref 150–400)
RBC: 4.57 MIL/uL (ref 3.87–5.11)
RDW: 14.6 % (ref 11.5–15.5)
Smear Review: NORMAL
WBC: 34.2 K/uL — ABNORMAL HIGH (ref 4.0–10.5)
nRBC: 0 % (ref 0.0–0.2)

## 2024-10-16 LAB — BASIC METABOLIC PANEL WITH GFR
Anion gap: 15 (ref 5–15)
BUN: 15 mg/dL (ref 6–20)
CO2: 28 mmol/L (ref 22–32)
Calcium: 9.2 mg/dL (ref 8.9–10.3)
Chloride: 88 mmol/L — ABNORMAL LOW (ref 98–111)
Creatinine, Ser: 0.68 mg/dL (ref 0.44–1.00)
GFR, Estimated: >60 mL/min (ref >60)
Glucose, Bld: 286 mg/dL — ABNORMAL HIGH (ref 70–99)
Potassium: 3.2 mmol/L — ABNORMAL LOW (ref 3.5–5.1)
Sodium: 130 mmol/L — ABNORMAL LOW (ref 135–145)

## 2024-10-16 LAB — COMPREHENSIVE METABOLIC PANEL WITH GFR
ALT: 22 U/L (ref 0–44)
AST: 37 U/L (ref 15–41)
Albumin: 3.1 g/dL — ABNORMAL LOW (ref 3.5–5.0)
Alkaline Phosphatase: 266 U/L — ABNORMAL HIGH (ref 38–126)
Anion gap: 17 — ABNORMAL HIGH (ref 5–15)
BUN: 20 mg/dL (ref 6–20)
CO2: 30 mmol/L (ref 22–32)
Calcium: 10.5 mg/dL — ABNORMAL HIGH (ref 8.9–10.3)
Chloride: 80 mmol/L — ABNORMAL LOW (ref 98–111)
Creatinine, Ser: 0.79 mg/dL (ref 0.44–1.00)
GFR, Estimated: >60 mL/min (ref >60)
Glucose, Bld: 529 mg/dL (ref 70–99)
Potassium: 3.9 mmol/L (ref 3.5–5.1)
Sodium: 126 mmol/L — ABNORMAL LOW (ref 135–145)
Total Bilirubin: 0.5 mg/dL (ref 0.0–1.2)
Total Protein: 10.2 g/dL — ABNORMAL HIGH (ref 6.5–8.1)

## 2024-10-16 LAB — BETA-HYDROXYBUTYRIC ACID
Beta-Hydroxybutyric Acid: 3.75 mmol/L — ABNORMAL HIGH (ref 0.05–0.27)
Beta-Hydroxybutyric Acid: 0.93 mmol/L — ABNORMAL HIGH (ref 0.05–0.27)

## 2024-10-16 LAB — GLUCOSE, CAPILLARY
Glucose-Capillary: 170 mg/dL — ABNORMAL HIGH (ref 70–99)
Glucose-Capillary: 215 mg/dL — ABNORMAL HIGH (ref 70–99)
Glucose-Capillary: 265 mg/dL — ABNORMAL HIGH (ref 70–99)
Glucose-Capillary: 374 mg/dL — ABNORMAL HIGH (ref 70–99)
Glucose-Capillary: 458 mg/dL — ABNORMAL HIGH (ref 70–99)
Glucose-Capillary: 527 mg/dL (ref 70–99)

## 2024-10-16 LAB — CBC
HCT: 30.1 % — ABNORMAL LOW (ref 36.0–46.0)
Hemoglobin: 9.3 g/dL — ABNORMAL LOW (ref 12.0–15.0)
MCH: 24.3 pg — ABNORMAL LOW (ref 26.0–34.0)
MCHC: 30.9 g/dL (ref 30.0–36.0)
MCV: 78.8 fL — ABNORMAL LOW (ref 80.0–100.0)
Platelets: 523 K/uL — ABNORMAL HIGH (ref 150–400)
RBC: 3.82 MIL/uL — ABNORMAL LOW (ref 3.87–5.11)
RDW: 14.7 % (ref 11.5–15.5)
WBC: 32.2 K/uL — ABNORMAL HIGH (ref 4.0–10.5)
nRBC: 0 % (ref 0.0–0.2)

## 2024-10-16 LAB — PROTIME-INR
INR: 1.2 (ref 0.8–1.2)
Prothrombin Time: 15.6 s — ABNORMAL HIGH (ref 11.4–15.2)

## 2024-10-16 LAB — HCG, SERUM, QUALITATIVE: Preg, Serum: NEGATIVE

## 2024-10-16 LAB — PROCALCITONIN: Procalcitonin: 0.6 ng/mL

## 2024-10-16 LAB — MAGNESIUM: Magnesium: 1.4 mg/dL — ABNORMAL LOW (ref 1.7–2.4)

## 2024-10-16 LAB — TSH: TSH: 0.467 u[IU]/mL (ref 0.350–4.500)

## 2024-10-16 LAB — SEDIMENTATION RATE: Sed Rate: 136 mm/h — ABNORMAL HIGH (ref 0–22)

## 2024-10-16 LAB — HIV ANTIBODY (ROUTINE TESTING W REFLEX): HIV Screen 4th Generation wRfx: NONREACTIVE

## 2024-10-16 LAB — I-STAT CG4 LACTIC ACID, ED: Lactic Acid, Venous: 2 mmol/L (ref 0.5–1.9)

## 2024-10-16 LAB — PHOSPHORUS: Phosphorus: 1.8 mg/dL — ABNORMAL LOW (ref 2.5–4.6)

## 2024-10-16 LAB — MRSA NEXT GEN BY PCR, NASAL: MRSA by PCR Next Gen: NOT DETECTED

## 2024-10-16 LAB — C-REACTIVE PROTEIN: CRP: 34.6 mg/dL — ABNORMAL HIGH (ref <1.0)

## 2024-10-16 MED ORDER — DEXTROSE 50 % IV SOLN: 0.0000 mL | INTRAVENOUS | Status: DC | PRN

## 2024-10-16 MED ORDER — SODIUM CHLORIDE 0.9 % IV SOLN: Freq: Once | INTRAVENOUS | Status: AC

## 2024-10-16 MED ORDER — DEXTROSE IN LACTATED RINGERS 5 % IV SOLN: INTRAVENOUS | Status: DC

## 2024-10-16 MED ORDER — CHLORHEXIDINE GLUCONATE CLOTH 2 % EX PADS
6.0000 | MEDICATED_PAD | Freq: Every day | CUTANEOUS | Status: DC
  Administered 2024-10-16 – 2024-10-31 (×16): 6 via TOPICAL

## 2024-10-16 MED ORDER — ONDANSETRON HCL 4 MG PO TABS: 4.0000 mg | ORAL_TABLET | Freq: Four times a day (QID) | ORAL | Status: DC | PRN

## 2024-10-16 MED ORDER — SODIUM CHLORIDE 0.9 % IV BOLUS
1000.0000 mL | Freq: Once | INTRAVENOUS | Status: AC
  Administered 2024-10-16: 1000 mL via INTRAVENOUS

## 2024-10-16 MED ORDER — LACTATED RINGERS IV SOLN: INTRAVENOUS | Status: AC

## 2024-10-16 MED ORDER — OXYCODONE HCL 5 MG PO TABS
5.0000 mg | ORAL_TABLET | ORAL | Status: DC | PRN
  Administered 2024-10-17 – 2024-10-20 (×11): 5 mg via ORAL
  Filled 2024-10-16 (×13): qty 1

## 2024-10-16 MED ORDER — POLYETHYLENE GLYCOL 3350 17 G PO PACK: 17.0000 g | PACK | Freq: Every day | ORAL | Status: DC | PRN

## 2024-10-16 MED ORDER — ACETAMINOPHEN 650 MG RE SUPP: 650.0000 mg | Freq: Four times a day (QID) | RECTAL | Status: DC | PRN

## 2024-10-16 MED ORDER — VANCOMYCIN HCL 1.5 G IV SOLR: 1500.0000 mg | Freq: Once | INTRAVENOUS | Status: DC

## 2024-10-16 MED ORDER — VANCOMYCIN HCL 1250 MG/250ML IV SOLN
1250.0000 mg | INTRAVENOUS | Status: DC
  Administered 2024-10-17 – 2024-10-18 (×2): 1250 mg via INTRAVENOUS
  Filled 2024-10-16 (×3): qty 250

## 2024-10-16 MED ORDER — AMITRIPTYLINE HCL 25 MG PO TABS
25.0000 mg | ORAL_TABLET | Freq: Every evening | ORAL | Status: DC | PRN
  Administered 2024-10-17 – 2024-10-21 (×3): 50 mg via ORAL
  Filled 2024-10-16 (×5): qty 2

## 2024-10-16 MED ORDER — PREGABALIN 50 MG PO CAPS: 200.0000 mg | ORAL_CAPSULE | Freq: Two times a day (BID) | ORAL | Status: DC

## 2024-10-16 MED ORDER — LACTATED RINGERS IV SOLN: INTRAVENOUS | Status: DC

## 2024-10-16 MED ORDER — VANCOMYCIN HCL 1.5 G IV SOLR
1500.0000 mg | Freq: Once | INTRAVENOUS | Status: DC
  Filled 2024-10-16: qty 30

## 2024-10-16 MED ORDER — LINEZOLID 600 MG/300ML IV SOLN
600.0000 mg | Freq: Once | INTRAVENOUS | Status: AC
  Administered 2024-10-16: 600 mg via INTRAVENOUS
  Filled 2024-10-16: qty 300

## 2024-10-16 MED ORDER — ACETAMINOPHEN 325 MG PO TABS
650.0000 mg | ORAL_TABLET | Freq: Four times a day (QID) | ORAL | Status: DC | PRN
  Administered 2024-10-17 – 2024-10-20 (×3): 650 mg via ORAL
  Filled 2024-10-16 (×3): qty 2

## 2024-10-16 MED ORDER — COLLAGENASE 250 UNIT/GM EX OINT
TOPICAL_OINTMENT | Freq: Every day | CUTANEOUS | Status: DC
  Filled 2024-10-16 (×3): qty 30

## 2024-10-16 MED ORDER — ALPRAZOLAM 0.5 MG PO TABS
0.5000 mg | ORAL_TABLET | Freq: Every evening | ORAL | Status: DC | PRN
  Administered 2024-10-17 – 2024-10-30 (×14): 0.5 mg via ORAL
  Filled 2024-10-16 (×14): qty 1

## 2024-10-16 MED ORDER — ORAL CARE MOUTH RINSE
15.0000 mL | OROMUCOSAL | Status: DC | PRN
Start: 2024-10-16 — End: 2024-11-01

## 2024-10-16 MED ORDER — ALBUTEROL SULFATE (2.5 MG/3ML) 0.083% IN NEBU: 2.5000 mg | INHALATION_SOLUTION | RESPIRATORY_TRACT | Status: DC | PRN

## 2024-10-16 MED ORDER — ENOXAPARIN SODIUM 40 MG/0.4ML IJ SOSY
40.0000 mg | PREFILLED_SYRINGE | INTRAMUSCULAR | Status: DC
  Administered 2024-10-16 – 2024-10-30 (×15): 40 mg via SUBCUTANEOUS
  Filled 2024-10-16 (×15): qty 0.4

## 2024-10-16 MED ORDER — HYDRALAZINE HCL 20 MG/ML IJ SOLN
10.0000 mg | Freq: Four times a day (QID) | INTRAMUSCULAR | Status: DC | PRN
Start: 2024-10-16 — End: 2024-10-19

## 2024-10-16 MED ORDER — INSULIN REGULAR(HUMAN) IN NACL 100-0.9 UT/100ML-% IV SOLN
INTRAVENOUS | Status: DC
  Administered 2024-10-16: 14 [IU]/h via INTRAVENOUS
  Filled 2024-10-16: qty 100

## 2024-10-16 MED ORDER — BUPROPION HCL ER (XL) 150 MG PO TB24
300.0000 mg | ORAL_TABLET | Freq: Every day | ORAL | Status: DC
Start: 2024-10-16 — End: 2024-10-16

## 2024-10-16 MED ORDER — IOHEXOL 300 MG/ML  SOLN
100.0000 mL | Freq: Once | INTRAMUSCULAR | Status: AC | PRN
  Administered 2024-10-17: 100 mL via INTRAVENOUS

## 2024-10-16 MED ORDER — HYDROXYZINE HCL 25 MG PO TABS
50.0000 mg | ORAL_TABLET | Freq: Two times a day (BID) | ORAL | Status: DC
  Administered 2024-10-16 – 2024-10-31 (×28): 50 mg via ORAL
  Filled 2024-10-16 (×30): qty 2

## 2024-10-16 MED ORDER — LINEZOLID 600 MG/300ML IV SOLN
600.0000 mg | Freq: Two times a day (BID) | INTRAVENOUS | Status: AC
  Administered 2024-10-17 – 2024-10-22 (×11): 600 mg via INTRAVENOUS
  Filled 2024-10-16 (×12): qty 300

## 2024-10-16 MED ORDER — SODIUM CHLORIDE 0.9 % IV SOLN
3.0000 g | Freq: Four times a day (QID) | INTRAVENOUS | Status: AC
  Administered 2024-10-17 – 2024-10-22 (×22): 3 g via INTRAVENOUS
  Filled 2024-10-16 (×24): qty 8

## 2024-10-16 MED ORDER — POTASSIUM CHLORIDE 10 MEQ/100ML IV SOLN: 10.0000 meq | INTRAVENOUS | Status: AC

## 2024-10-16 MED ORDER — PIPERACILLIN-TAZOBACTAM 3.375 G IVPB 30 MIN
3.3750 g | Freq: Once | INTRAVENOUS | Status: AC
  Administered 2024-10-16: 3.375 g via INTRAVENOUS
  Filled 2024-10-16: qty 50

## 2024-10-16 MED ORDER — QUETIAPINE FUMARATE 50 MG PO TABS: 50.0000 mg | ORAL_TABLET | Freq: Every day | ORAL | Status: DC

## 2024-10-16 MED ORDER — BISACODYL 10 MG RE SUPP
10.0000 mg | Freq: Every day | RECTAL | Status: DC | PRN
Start: 2024-10-16 — End: 2024-10-22

## 2024-10-16 MED ORDER — ONDANSETRON HCL 4 MG/2ML IJ SOLN
4.0000 mg | Freq: Four times a day (QID) | INTRAMUSCULAR | Status: DC | PRN
Start: 2024-10-16 — End: 2024-10-21
  Administered 2024-10-17 – 2024-10-18 (×3): 4 mg via INTRAVENOUS
  Filled 2024-10-16 (×3): qty 2

## 2024-10-16 MED ORDER — INSULIN REGULAR(HUMAN) IN NACL 100-0.9 UT/100ML-% IV SOLN: INTRAVENOUS | Status: DC

## 2024-10-16 MED ORDER — BUSPIRONE HCL 10 MG PO TABS: 10.0000 mg | ORAL_TABLET | Freq: Every day | ORAL | Status: DC

## 2024-10-16 MED ORDER — VANCOMYCIN HCL 1500 MG/300ML IV SOLN
1500.0000 mg | Freq: Once | INTRAVENOUS | Status: AC
  Administered 2024-10-16: 1500 mg via INTRAVENOUS
  Filled 2024-10-16: qty 300

## 2024-10-16 MED ORDER — FENTANYL CITRATE (PF) 50 MCG/ML IJ SOSY
12.5000 ug | PREFILLED_SYRINGE | INTRAMUSCULAR | Status: DC | PRN
  Administered 2024-10-17 – 2024-10-20 (×6): 12.5 ug via INTRAVENOUS
  Filled 2024-10-16 (×7): qty 1

## 2024-10-16 MED ORDER — VALACYCLOVIR HCL 500 MG PO TABS: 500.0000 mg | ORAL_TABLET | Freq: Every day | ORAL | Status: DC

## 2024-10-16 MED ORDER — ENOXAPARIN SODIUM 40 MG/0.4ML IJ SOSY: 40.0000 mg | PREFILLED_SYRINGE | INTRAMUSCULAR | Status: DC

## 2024-10-16 MED ORDER — SODIUM CHLORIDE 0.9 % IV SOLN: 1.0000 g | Freq: Once | INTRAVENOUS | Status: DC

## 2024-10-16 MED ORDER — SODIUM CHLORIDE 0.9 % IV SOLN
1.0000 g | Freq: Three times a day (TID) | INTRAVENOUS | Status: DC
  Filled 2024-10-16: qty 20

## 2024-10-16 MED ORDER — DEXTROSE IN LACTATED RINGERS 5 % IV SOLN: INTRAVENOUS | Status: AC

## 2024-10-16 MED ORDER — VANCOMYCIN HCL 1.25 G IV SOLR: 1250.0000 mg | INTRAVENOUS | Status: DC

## 2024-10-16 NOTE — ED Triage Notes (Signed)
 Pt arrives to triage via wheelchair. Pt states that she was instructed to come to the ER on Wednesday by wound care, due to a wound to the RIGHT foot. Dressing is visible soiled, and malodorous. Pt also has swelling to the face, and is unsure of the cause.

## 2024-10-16 NOTE — Progress Notes (Addendum)
 ED Pharmacy Antibiotic Sign Off An antibiotic consult was received from an ED provider for vancomycin and Zosyn per pharmacy dosing for cellulitis/right foot wound. Imaging pending. A chart review was completed to assess appropriateness.   The following one time order(s) were placed:  Vancomycin 1500mg  IV x1 Zosyn 3.375g IV x1  Further antibiotic and/or antibiotic pharmacy consults should be ordered by the admitting provider if indicated.     Thank you for allowing pharmacy to be a part of this patient's care.   Lacinda Moats, PharmD Clinical Pharmacist  10/31/20253:02 PM   Addendum: Changing vancomycin to linezolid 600mg  IV x1 while awaiting pregnancy test result. For pregnant patients, we cannot use our vanc premix bags due to a preservative in the premix that's harmful to the child.

## 2024-10-16 NOTE — H&P (Signed)
 History and Physical    Patient: Phyllis Pittman FMW:[id number] DOB: 1976/01/17 DOA: 10/16/2024 DOS: the patient was seen and examined on 10/16/2024 PCP: [name], PA  Patient coming from: Home  Chief Complaint:  Chief Complaint  Patient presents with   Wound Infection   Facial Swelling   HPI: Phyllis Pittman is a 48 y.o. female with medical history significant for anxiety, history of CVA, depression with anxiety, diabetes mellitus type 2 which is uncontrolled, hypertension, hyperlipidemia GERD, migraines, neuropathy, sciatica as well as other comorbidities who was recently treated for a left foot infection and had a PICC line.  She went to go see her wound care specialist last week and was reportedly found to have a new wound with a fever at that time.  At that time her right foot was swollen and erythematous up to the ankle with congested discolored small toe with multiple open wounds and had a unstable lateral aspect concerning small toe as well as circumferential blistering of the fourth webspace.  On 10/07/2024 she was advised to go to the emergency room for further evaluation and antibiotics and formal debridement.  She did not go to the ER and kept her Vashe wet-to-dry dressings in place and went back to the wound care center on 10/14/2024 where she was again advised to go to the ED given that her foot had developed and necrotic duskiness and dark eschar on both the plantar and dorsal surface 6 as well as an extensive order.    Patient's Wound care physician had found that the wound had progressively gotten worse and she was noted to be lethargic with malaise.  EMS was called during this clinic visit but she refused to go with EMS to the hospital for further evaluation.  Subsequently she left the clinic despite the advice of the wound care physician to go to the ED.  She now presents 2 days later with worsening foul-smelling drainage, pain and development of a facial rash since  Sunday.  She continues to complain of pain and feeling ill.  Denies chest pain, shortness of breath but states that she has been having diarrhea and abdominal discomfort and attributes it to her history of IBS.  Also complains of new facial erythema and swelling and thinks it is because she dropped her phone on her face when laying in bed.  Denies any other concerns or complaints at this time.   Review of Systems: As mentioned in the history of present illness. All other systems reviewed and are negative. Past Medical History:  Diagnosis Date   Anxiety    Cancer (HCC)    neoplasm of cervix   CVA (cerebral vascular accident) (HCC)    hx of   Depression with anxiety    Diabetes mellitus without complication (HCC)    type 2   Female hirsutism    Fibromyalgia    GERD without esophagitis    Hyperlipidemia    Hypertension    IBS (irritable bowel syndrome)    Long term (current) use of insulin  (HCC)    Migraine    Myopia of both eyes    Neuropathy    feet   Neuropathy    OSA (obstructive sleep apnea)    Sciatica    Shingles    Past Surgical History:  Procedure Laterality Date   CESAREAN SECTION     05/2001, 07/2012   CHOLECYSTECTOMY  10/1999   FACIAL COSMETIC SURGERY     FOOT SURGERY     Social  History:  reports that she has been smoking cigarettes. She has never used smokeless tobacco. She reports current alcohol use. She reports that she does not use drugs.  Allergies  Allergen Reactions   Dust Mite Extract Anxiety, Hives, Itching, Rash, Shortness Of Breath and Swelling   Molds & Smuts Anaphylaxis, Anxiety, Hives, Itching, Nausea Only, Rash, Shortness Of Breath and Swelling   Prednisone Other (See Comments)    Other reaction(s): Other (See Comments) Hyperglycemia. Hyperglycemia. Other reaction(s): Other (See Comments) Hyperglycemia. Not the dose pack    Venlafaxine Other (See Comments) and Shortness Of Breath    Chest discomfort    Latex Rash    Powder only     Metformin Nausea And Vomiting, Nausea Only and Other (See Comments)   Acyclovir Itching and Nausea Only   Cephalexin  Nausea Only    Toletares IV - not pills   Codeine Other (See Comments)    Unknown   Famotidine    Levaquin [Levofloxacin] Nausea And Vomiting    Po only can take iv   Semaglutide Other (See Comments)    Made her really sick   Sulfa Antibiotics     Other reaction(s): stomach upset   Trulicity [Dulaglutide] Nausea And Vomiting   Family History  Problem Relation Age of Onset   COPD Mother    Diabetes Mother    Cancer Mother    Diabetes Father    Cancer Father    Prior to Admission medications   Medication Sig Start Date End Date Taking? Authorizing Provider  ALPRAZolam  (XANAX ) 0.5 MG tablet Take 1 tablet (0.5 mg total) by mouth at bedtime as needed for anxiety. 07/13/21   Patt Alm Macho, MD  amitriptyline  (ELAVIL ) 25 MG tablet Take 25-50 mg by mouth at bedtime as needed for sleep. 01/21/20   [provider]  aspirin  EC 81 MG tablet Take 81 mg by mouth daily. Swallow whole.    [provider]  buPROPion  (WELLBUTRIN  XL) 300 MG 24 hr tablet Take 300 mg by mouth at bedtime. 08/05/20   [provider]  busPIRone  (BUSPAR ) 10 MG tablet Take 10 mg by mouth daily. 07/18/21   [provider]  cyclobenzaprine  (FLEXERIL ) 5 MG tablet Take 1 tablet (5 mg total) by mouth 3 (three) times daily as needed for muscle spasms. 07/13/21   Patt Alm Macho, MD  etodolac (LODINE) 200 MG capsule Take 200 mg by mouth 2 (two) times daily. 07/23/21   [provider]  fluconazole (DIFLUCAN) 150 MG tablet Take 150 mg by mouth daily as needed (itching). 03/31/21   [provider]  hydrOXYzine  (ATARAX /VISTARIL ) 10 MG tablet Take 10 mg by mouth 3 (three) times daily as needed for itching or anxiety.    [provider]  Insulin  Glargine (BASAGLAR  KWIKPEN) 100 UNIT/ML Inject 30 Units into the skin at bedtime. 08/14/21 09/13/21  Rojelio Nest, DO   Lidocaine  0.5 % GEL Apply 1 application topically daily as needed (pain).    [provider]  naproxen (NAPROSYN) 500 MG tablet Take 500 mg by mouth daily as needed for mild pain.    [provider]  NOVOLOG  100 UNIT/ML injection Inject 0-10 Units into the skin 3 (three) times daily with meals. Sliding scale 05/09/20   [provider]  ondansetron  (ZOFRAN  ODT) 8 MG disintegrating tablet Take 1 tablet (8 mg total) by mouth every 8 (eight) hours as needed for nausea or vomiting. 02/22/18   Kirichenko, Tatyana, PA-C  pregabalin  (LYRICA ) 200 MG capsule Take  200 mg by mouth 2 (two) times daily. 08/31/20   [provider]  QUEtiapine  (SEROQUEL ) 50 MG tablet Take 50 mg by mouth at bedtime. 05/09/20   [provider]  tiZANidine (ZANAFLEX) 4 MG tablet Take 4 mg by mouth every 8 (eight) hours as needed for muscle spasms.    [provider]  traMADol  (ULTRAM ) 50 MG tablet Take 50 mg by mouth 2 (two) times daily as needed for moderate pain. 10/02/20   [provider]  valACYclovir  (VALTREX ) 500 MG tablet Take 500 mg by mouth daily. 06/29/21   [provider]   Physical Exam: Vitals:   10/16/24 1320 10/16/24 1646  BP: (!) 81/58 104/67  Pulse: 88 92  Resp: 18 16  Temp: 97.8 F (36.6 C) 98.7 F (37.1 C)  TempSrc: Oral Oral  SpO2: 98% 95%  Weight: 65.8 kg   Height: 5' (1.524 m)    Examination: Physical Exam:  Constitutional: Chronic ill-appearing older appearing than her stated age Caucasian female who appears acutely ill and uncomfortable Respiratory: Diminished to auscultation bilaterally, no wheezing, rales, rhonchi or crackles. Normal respiratory effort and patient is not tachypenic. No accessory muscle use.  Cardiovascular: RRR, no murmurs / rubs / gallops. S1 and S2 auscultated.  She has swelling of her right foot Abdomen: Soft, non-tender, non-distended.  Bowel sounds positive.  GU: Deferred. Musculoskeletal: Left foot toe  amputations noted.  Right foot bony destruction noted Skin: Has a very large necrotic appearing wound on her right foot with erythema and bony destruction with missing fifth digit and skin peeled back.  Has a sacral wound and facial erythema and puffiness and swelling in the malar region which is painful to palpate Neurologic: CN 2-12 grossly intact with no focal deficits. Romberg sign cerebellar reflexes not assessed.  Psychiatric: Appears little subdued and appears calm  Data Reviewed: Recent Results (from the past 2160 hours)  Comprehensive metabolic panel     Status: Abnormal   Collection Time: 10/16/24  4:32 PM  Result Value Ref Range   Sodium 126 (L) 135 - 145 mmol/L   Potassium 3.9 3.5 - 5.1 mmol/L   Chloride 80 (L) 98 - 111 mmol/L   CO2 30 22 - 32 mmol/L   Glucose, Bld 529 (HH) 70 - 99 mg/dL    Comment: Critical Value, Read Back and verified with TANDA, A. RN AT 1703 ON 10.31.25.FA Glucose reference range applies only to samples taken after fasting for at least 8 hours.    BUN 20 6 - 20 mg/dL   Creatinine, Ser 9.20 0.44 - 1.00 mg/dL   Calcium 89.4 (H) 8.9 - 10.3 mg/dL   Total Protein 89.7 (H) 6.5 - 8.1 g/dL   Albumin 3.1 (L) 3.5 - 5.0 g/dL   AST 37 15 - 41 U/L   ALT 22 0 - 44 U/L   Alkaline Phosphatase 266 (H) 38 - 126 U/L   Total Bilirubin 0.5 0.0 - 1.2 mg/dL   GFR, Estimated >39 >39 mL/min    Comment: (NOTE) Calculated using the CKD-EPI Creatinine Equation (2021)    Anion gap 17 (H) 5 - 15    Comment: Performed at [facility], [address], [place], [place] [address]  CBC with Differential     Status: Abnormal   Collection Time: 10/16/24  4:32 PM  Result Value Ref Range   WBC 34.2 (H) 4.0 - 10.5 K/uL   RBC 4.57 3.87 - 5.11 MIL/uL   Hemoglobin 10.9 (L) 12.0 -  15.0 g/dL   HCT 64.0 (L) 63.9 - 53.9 %   MCV 78.6 (L) 80.0 - 100.0 fL   MCH 23.9 (L) 26.0 - 34.0 pg   MCHC 30.4 30.0 - 36.0 g/dL   RDW 85.3 88.4 - 84.4 %   Platelets 552 (H) 150 - 400  K/uL   nRBC 0.0 0.0 - 0.2 %   Neutrophils Relative % 85 %   Neutro Abs 29.3 (H) 1.7 - 7.7 K/uL   Lymphocytes Relative 6 %   Lymphs Abs 1.9 0.7 - 4.0 K/uL   Monocytes Relative 4 %   Monocytes Absolute 1.5 (H) 0.1 - 1.0 K/uL   Eosinophils Relative 0 %   Eosinophils Absolute 0.0 0.0 - 0.5 K/uL   Basophils Relative 1 %   Basophils Absolute 0.2 (H) 0.0 - 0.1 K/uL   WBC Morphology MORPHOLOGY UNREMARKABLE    RBC Morphology MORPHOLOGY UNREMARKABLE    Smear Review Normal platelet morphology    Immature Granulocytes 4 %   Abs Immature Granulocytes 1.27 (H) 0.00 - 0.07 K/uL    Comment: Performed at [facility], [address] [place], [place] [address]  Protime-INR     Status: Abnormal   Collection Time: 10/16/24  4:32 PM  Result Value Ref Range   Prothrombin Time 15.6 (H) 11.4 - 15.2 seconds   INR 1.2 0.8 - 1.2    Comment: (NOTE) INR goal varies based on device and disease states. Performed at [facility], [address] [place], [place] [address]   hCG, serum, qualitative     Status: None   Collection Time: 10/16/24  4:32 PM  Result Value Ref Range   Preg, Serum NEGATIVE NEGATIVE    Comment:        THE SENSITIVITY OF THIS METHODOLOGY IS >10 mIU/mL. Performed at [facility], [address], [place], [place] [address]   I-Stat Lactic Acid, ED     Status: Abnormal   Collection Time: 10/16/24  4:33 PM  Result Value Ref Range   Lactic Acid, Venous 2.0 (HH) 0.5 - 1.9 mmol/L   Comment NOTIFIED PHYSICIAN    EKG: Showed a sinus rhythm at a rate of 94 with borderline T wave abnormalities and a prolonged QTc at 516.  No evidence of ST elevation or depression on my interpretation.  Assessment and Plan: No notes have been filed under this hospital service. Service: Hospitalist  Severe Sepsis 2/2 Right Foot Cellulitis and Osteomyelitis w/ Concern for Necrotizing Fasciitis and Gas Gangrene: Status post 2 L boluses and placed on  maintenance IV fluid hydration and will give another 1 L bolus now.  Blood pressure has been borderline low.  Admitted to the stepdown unit feel that she may have a disseminated infection so checking blood cultures x 2.  Foot x-ray done and showed:  Soft tissue ulceration involving the lateral forefoot, with extensive soft tissue gas throughout the lateral forefoot and midfoot. Findings are consistent with cellulitis and infection with gas-forming organism. Areas of bony destruction involving the fifth digit phalanges, head of the fifth metatarsal, and head of the fourth metatarsal, compatible with osteomyelitis and possible septic arthritis.  No acute fracture. Checking CT cervical spine and CT maxillofacial with contrast.  Check echocardiogram.  Follow cultures.  Check urinalysis and urine culture.  Chest x-ray was negative.  Initiate IV antibiotic coverage and IV fluid hydration with LR at 1.5 mL/h and then transition to D5 and LR 100 CBGs are less than 250.  Continue insulin  drip for now.  Discussed with infectious disease and they are recommending IV antibiotic coverage with vancomycin, linezolid and Unasyn.  Orthopedic surgery has been consulted and will see the patient.  Given her significant lower extremity wounds and ulcers will check vascular ABI.  Check procalcitonin and trend lactic acid level is was 2.0 on admission. WBC is 34.2 and has been having intermittent fevers w/ Fever being 101 at home  Uncontrolled Diabetes Mellitus Type 2 with significant hyperglycemia: Not acidotic on admission but will initiate insulin  drip and bolus IV fluids as above.  Check a urinalysis and a beta-hydroxybutyrate acid.  Transition off of insulin  drip once blood sugars are better controlled.  Keep n.p.o. for now given that she is likely go for some type of intervention.  Once transitioned off insulin  drip and tolerating diet we will go long-acting and NovoLog  AC and at bedtime.  Checking hemoglobin A1c in AM; Hold  home Insulin  regimen   Facial Erythema and Swelling/ Malar Rash: Check Maxillofacial CT. Consider Autoimmune testing   Elevated Anion Gap: Check VBG.  Presents with a CO2 of 30, anion gap of 17 and chloride level of 80.  Give IV fluid hydration and insulin  drip as above.  Likely in the setting of sepsis  Diarrhea / Hx of IBS: Check for infectious diarrhea etiology with C. difficile and GI pathogen panel.  Likely related to irritable bowel syndrome but given her extremely high white count will need to rule out infectious diarrhea first before initiating antidiarrheals  Pseudohyponatremia: Na+ was 126. Corrected for Glucose was 133 for Micky and 136 for [place]. IVF and Insulin  gtt  Hx of CVA: Hold ASA for now in case she goes for Surgical Intervention  Essential HTN: Not on any Antihypertensives at home and currently BP is soft  Hypercalcemia: IVF as above  Neck Pain: Check Neck CT w Contrast to evaluate. Pain Control  Depression and anxiety and mood disorder: Continue with buspirone  10 mg p.o. daily, bupropion  300 p.o. nightly, quetiapine  50 mg p.o. daily; Resume Alprazolam   Microcytic Anemia: Hgb/Hct is now 10.9/35.9 w/ MCV of 78.6. Check Anemia Panel in the AM. CTM for S/Sx of Bleeding; No overt bleeding noted. Repeat CBC in the AM  Multiple Wounds/Sacral Wound: WOC Nurse Consult  History of POTS: IV fluid hydration and monitoring of blood pressure carefully.  Given her leg wounds unable to use compression stockings.  Continue with antidepressants as above; recommending avoiding caffeine and alcohol  Hypoalbuminemia: Patient's Albumin Lvl is 3.1. CTM and Trend and repeat CMP in the AM  Advance Care Planning:   Code Status: Prior FULL  Consults: Orthopedic Surgery, ID  Family Communication: No family present @ bedside  Severity of Illness: The appropriate patient status for this patient is INPATIENT. Inpatient status is judged to be reasonable and necessary in order to provide the  required intensity of service to ensure the patient's safety. The patient's presenting symptoms, physical exam findings, and initial radiographic and laboratory data in the context of their chronic comorbidities is felt to place them at high risk for further clinical deterioration. Furthermore, it is not anticipated that the patient will be medically stable for discharge from the hospital within 2 midnights of admission.   * I certify that at the point of admission it is my clinical judgment that the patient will require inpatient hospital care spanning beyond 2 midnights from the point of admission due to high intensity of service, high risk for further deterioration and high  frequency of surveillance required.*  This patient is critically ill with multiple organ system failure and requires high complexity decision making for assessment and support, frequent evaluation and titration of therapies and application of advanced monitoring technologies and extensive interpretation of multiple databases  CRITICAL CARE TIME: 47 nonconsecutive minutes devoted to patient care services described in this note including but not limited to reviewing the chart, seeing the patient, coordinating care, updating family present and discussion with multiple consultants  Author: Alejandro Marker, DO [facility] 10/16/2024 5:44 PM  For on call review [email or web address].

## 2024-10-16 NOTE — Consult Note (Addendum)
 WOC Nurse Consult Note: patient with gangrene R foot orthopedics has been consulted and will manage that foot; patients L foot wound has been treated at [facility], using gentian violet and prisma for that wound neither of which are on formulary at [facility]  reason for Consult: L foot and sacral wound  Wound type: 1. Full thickness L lateral foot diabetic ulcer 50% red 50% tan  2.  Unstageable Pressure Injury Sacrum 80% tan black 20% red    Pressure Injury POA: Yes Measurement: see nursing flowsheet  Wound bed: as above  Drainage (amount, consistency, odor) see nursing flowsheet  Periwound: erythema around sacral wound  Dressing procedure/placement/frequency: Cleanse sacral wound with Vashe wound cleanser, do not rinse and allow to air dry.  Apply 1/4 thick layer of Santyl to wound bed, top with saline moist gauze, dry gauze and silicone foam or ABD pad whichever is preferred.  Cleanse L lateral foot wound with Vashe, do not rinse and allow to air dry. Apply Vashe moistened gauze to wound bed daily and secure with silicone foam or ABD pad and Kerlix roll gauze whichever is preferred.  Soak dressing in normal saline if adhered to wound bed for atraumatic removal.   POC discussed with bedside nurse. WOC team will not follow. Re-consult if further needs arise.   Thank you,    Powell Bar MSN, RN-BC, [employer]

## 2024-10-16 NOTE — Progress Notes (Addendum)
 Pharmacy Antibiotic Note  Phyllis Pittman is a 48 y.o. female admitted on 10/16/2024 with a necrotizing foot infection. On-call ID provider contacted and she suggested Unasyn, vancomycin, and linezolid therapy. Per ID provider, linezolid will provide toxin coverage but is not first-line for bacteremia, so vancomycin will be used in addition. Pharmacy has been consulted for Unasyn, linezolid, and vancomycin dosing.  Patient given one-time doses of linezolid and Zosyn while in the ED earlier today.   Plan: Start linezolid 600mg  IV q12h  Start Unasyn 3g IV q6h  Give vancomycin 1500mg  IV x1, followed by 1250mg  IV q24h (eAUC 469 using Scr 0.8, Vd 0.72) Vanc levels PRN Monitor renal function, cultures, and overall clinical picture De-escalate as able   Height: 5' (152.4 cm) Weight: 65.8 kg (145 lb) IBW/kg (Calculated) : 45.5  Temp (24hrs), Avg:98.3 F (36.8 C), Min:97.8 F (36.6 C), Max:98.7 F (37.1 C)  Recent Labs  Lab 10/16/24 1632 10/16/24 1633  WBC 34.2*  --   CREATININE 0.79  --   LATICACIDVEN  --  2.0*    Estimated Creatinine Clearance: 73.6 mL/min (by C-G formula based on SCr of 0.79 mg/dL).    Allergies  Allergen Reactions   Acyclovir Itching and Nausea Only   Dust Mite Extract Anaphylaxis, Hives, Shortness Of Breath, Itching, Swelling, Anxiety, Dermatitis and Rash   Molds & Smuts Anaphylaxis, Anxiety, Hives, Itching, Nausea Only, Rash, Shortness Of Breath and Swelling   Other Hives and Other (See Comments)    Dust, mold, powder   Prednisone Other (See Comments)    Hyperglycemia    Venlafaxine Shortness Of Breath and Other (See Comments)    Chest discomfort, too   Cephalexin  Nausea Only and Other (See Comments)    Tolerates via IV - not pills   Dulaglutide Nausea And Vomiting and Other (See Comments)    TRULICITY   Latex Dermatitis, Rash and Other (See Comments)    Powder only   Levofloxacin Nausea And Vomiting and Other (See Comments)    Can receive only via  IV   Metformin Nausea And Vomiting   Semaglutide Other (See Comments)    Made her really sick (Ozempic or Wegovy)   Sulfa Antibiotics Nausea Only and Other (See Comments)    Stomach upset   Codeine Other (See Comments)    Unknown reaction   Famotidine     Antimicrobials this admission: 10/31 Zosyn x1 10/31 vancomycin >>  10/31 Unasyn >>  10/31 linezolid >>   Dose adjustments this admission: N/A  Microbiology results: 10/31 BCx: in process 10/31 UCx: not yet collected  10/31 GIP: not yet collected 10/31 MRSA PCR: not yet collected  Thank you for allowing pharmacy to be a part of this patient's care.  Lacinda Moats, PharmD Clinical Pharmacist  10/31/20256:42 PM

## 2024-10-16 NOTE — ED Provider Notes (Signed)
 [facility] EMERGENCY DEPARTMENT AT [facility] Provider Note   CSN: [id number] Arrival date & time: 10/16/24  1314     Patient presents with: Wound Infection and Facial Swelling   Phyllis Pittman is a 48 y.o. female with past medical history of sepsis, type 2 diabetes, hypertension, hyperlipidemia, anxiety, depression, obstructive sleep apnea, history of CVA presents to emergency room with complaint of right foot wound ongoing for 4 months, but significantly worse over the last week. Temp of 101F at home several days ago.  Patient was reportedly told to come in by wound care 2 days ago. She notes foul smell, drainage, pain.  Also notes wound on sacrum and facial rash.    HPI     Prior to Admission medications   Medication Sig Start Date End Date Taking? Authorizing Provider  ALPRAZolam  (XANAX ) 0.5 MG tablet Take 1 tablet (0.5 mg total) by mouth at bedtime as needed for anxiety. 07/13/21   Patt Alm Macho, MD  amitriptyline  (ELAVIL ) 25 MG tablet Take 25-50 mg by mouth at bedtime as needed for sleep. 01/21/20   [provider]  aspirin  EC 81 MG tablet Take 81 mg by mouth daily. Swallow whole.    [provider]  buPROPion  (WELLBUTRIN  XL) 300 MG 24 hr tablet Take 300 mg by mouth at bedtime. 08/05/20   [provider]  busPIRone  (BUSPAR ) 10 MG tablet Take 10 mg by mouth daily. 07/18/21   [provider]  cyclobenzaprine  (FLEXERIL ) 5 MG tablet Take 1 tablet (5 mg total) by mouth 3 (three) times daily as needed for muscle spasms. 07/13/21   Patt Alm Macho, MD  etodolac (LODINE) 200 MG capsule Take 200 mg by mouth 2 (two) times daily. 07/23/21   [provider]  fluconazole (DIFLUCAN) 150 MG tablet Take 150 mg by mouth daily as needed (itching). 03/31/21   [provider]  hydrOXYzine  (ATARAX /VISTARIL ) 10 MG tablet Take 10 mg by mouth 3 (three) times daily as needed for itching or anxiety.    [provider]  Insulin   Glargine (BASAGLAR  KWIKPEN) 100 UNIT/ML Inject 30 Units into the skin at bedtime. 08/14/21 09/13/21  Rojelio Nest, DO  Lidocaine  0.5 % GEL Apply 1 application topically daily as needed (pain).    [provider]  naproxen (NAPROSYN) 500 MG tablet Take 500 mg by mouth daily as needed for mild pain.    [provider]  NOVOLOG  100 UNIT/ML injection Inject 0-10 Units into the skin 3 (three) times daily with meals. Sliding scale 05/09/20   [provider]  ondansetron  (ZOFRAN  ODT) 8 MG disintegrating tablet Take 1 tablet (8 mg total) by mouth every 8 (eight) hours as needed for nausea or vomiting. 02/22/18   Kirichenko, Tatyana, PA-C  pregabalin  (LYRICA ) 200 MG capsule Take 200 mg by mouth 2 (two) times daily. 08/31/20   [provider]  QUEtiapine  (SEROQUEL ) 50 MG tablet Take 50 mg by mouth at bedtime. 05/09/20   [provider]  tiZANidine (ZANAFLEX) 4 MG tablet Take 4 mg by mouth every 8 (eight) hours as needed for muscle spasms.    [provider]  traMADol  (ULTRAM ) 50 MG tablet Take 50 mg by mouth 2 (two) times daily as needed for moderate pain. 10/02/20   [provider]  valACYclovir  (VALTREX ) 500 MG tablet Take 500 mg by mouth daily. 06/29/21   [provider]    Allergies: Dust mite extract, Molds & smuts, Prednisone, Venlafaxine, Latex, Metformin, Acyclovir, Cephalexin , Codeine, Famotidine,  Levaquin [levofloxacin], Semaglutide, Sulfa antibiotics, and Trulicity [dulaglutide]    Review of Systems  Skin:  Positive for wound.    Updated Vital Signs BP 104/67 (BP Location: Right Arm)   Pulse 92   Temp 98.7 F (37.1 C) (Oral)   Resp 16   Ht 5' (1.524 m)   Wt 65.8 kg   SpO2 95%   BMI 28.32 kg/m   Physical Exam Vitals and nursing note reviewed.  Constitutional:      General: She is not in acute distress.    Appearance: She is not toxic-appearing.  HENT:     Head: Normocephalic and atraumatic.  Eyes:     General: No  scleral icterus.    Conjunctiva/sclera: Conjunctivae normal.  Cardiovascular:     Rate and Rhythm: Normal rate and regular rhythm.     Pulses: Normal pulses.     Heart sounds: Normal heart sounds.  Pulmonary:     Effort: Pulmonary effort is normal. No respiratory distress.     Breath sounds: Normal breath sounds.  Abdominal:     General: Abdomen is flat. Bowel sounds are normal.     Palpations: Abdomen is soft.     Tenderness: There is no abdominal tenderness.  Musculoskeletal:     Right lower leg: Edema present.     Left lower leg: Edema present.  Skin:    General: Skin is warm and dry.     Findings: Rash present. No lesion.  Neurological:     General: No focal deficit present.     Mental Status: She is alert and oriented to person, place, and time. Mental status is at baseline.             (all labs ordered are listed, but only abnormal results are displayed) Labs Reviewed  COMPREHENSIVE METABOLIC PANEL WITH GFR - Abnormal; Notable for the following components:      Result Value   Sodium 126 (*)    Chloride 80 (*)    Glucose, Bld 529 (*)    Calcium 10.5 (*)    Total Protein 10.2 (*)    Albumin 3.1 (*)    Alkaline Phosphatase 266 (*)    Anion gap 17 (*)    All other components within normal limits  CBC WITH DIFFERENTIAL/PLATELET - Abnormal; Notable for the following components:   WBC 34.2 (*)    Hemoglobin 10.9 (*)    HCT 35.9 (*)    MCV 78.6 (*)    MCH 23.9 (*)    Platelets 552 (*)    Neutro Abs 29.3 (*)    Monocytes Absolute 1.5 (*)    Basophils Absolute 0.2 (*)    Abs Immature Granulocytes 1.27 (*)    All other components within normal limits  PROTIME-INR - Abnormal; Notable for the following components:   Prothrombin Time 15.6 (*)    All other components within normal limits  I-STAT CG4 LACTIC ACID, ED - Abnormal; Notable for the following components:   Lactic Acid, Venous 2.0 (*)    All other components within normal limits  CULTURE, BLOOD  (ROUTINE X 2)  CULTURE, BLOOD (ROUTINE X 2)  HCG, SERUM, QUALITATIVE  URINALYSIS, W/ REFLEX TO CULTURE (INFECTION SUSPECTED)  BETA-HYDROXYBUTYRIC ACID  I-STAT CG4 LACTIC ACID, ED  I-STAT VENOUS BLOOD GAS, ED    EKG: None  Radiology: DG Foot Complete Right Result Date: 10/16/2024 CLINICAL DATA:  Right foot ulcer EXAM: RIGHT FOOT COMPLETE - 3+ VIEW COMPARISON:  None Available. FINDINGS: Frontal, oblique, lateral  views of the right foot are obtained. Large area of soft tissue ulceration within the lateral forefoot, with extensive subcutaneous gas throughout the forefoot and midfoot consistent with ulceration and cellulitis with gas-forming organism. There are areas of focal bony destruction involving the fifth digit phalanges, head of the fifth metatarsal, and head of the fourth metatarsal, consistent with osteomyelitis and septic arthritis. There are no acute displaced fractures. IMPRESSION: 1. Soft tissue ulceration involving the lateral forefoot, with extensive soft tissue gas throughout the lateral forefoot and midfoot. Findings are consistent with cellulitis and infection with gas-forming organism. 2. Areas of bony destruction involving the fifth digit phalanges, head of the fifth metatarsal, and head of the fourth metatarsal, compatible with osteomyelitis and possible septic arthritis. 3. No acute fracture. Electronically Signed   By: Ozell Daring M.D.   On: 10/16/2024 16:23   DG Chest Port 1 View Result Date: 10/16/2024 CLINICAL DATA:  Sepsis EXAM: PORTABLE CHEST 1 VIEW COMPARISON:  07/01/2024 FINDINGS: The heart size and mediastinal contours are within normal limits. Both lungs are clear. The visualized skeletal structures are unremarkable. IMPRESSION: No active disease. Electronically Signed   By: Ozell Daring M.D.   On: 10/16/2024 16:21     .Critical Care  Performed by: Shermon Warren SAILOR, PA-C Authorized by: Shermon Warren SAILOR, PA-C   Critical care provider statement:    Critical  care time (minutes):  30   Critical care was necessary to treat or prevent imminent or life-threatening deterioration of the following conditions:  Sepsis   Critical care was time spent personally by me on the following activities:  Development of treatment plan with patient or surrogate, discussions with consultants, evaluation of patient's response to treatment, examination of patient, ordering and review of laboratory studies, ordering and review of radiographic studies, ordering and performing treatments and interventions, pulse oximetry, re-evaluation of patient's condition and review of old charts   Care discussed with: admitting provider      Medications Ordered in the ED  piperacillin-tazobactam (ZOSYN) IVPB 3.375 g (has no administration in time range)  linezolid (ZYVOX) IVPB 600 mg (has no administration in time range)  sodium chloride  0.9 % bolus 1,000 mL (has no administration in time range)  0.9 %  sodium chloride  infusion (has no administration in time range)  insulin  regular, human (MYXREDLIN) 100 units/ 100 mL infusion (has no administration in time range)  lactated ringers infusion (has no administration in time range)  dextrose 5 % in lactated ringers infusion (has no administration in time range)  dextrose 50 % solution 0-50 mL (has no administration in time range)  sodium chloride  0.9 % bolus 1,000 mL (1,000 mLs Intravenous New Bag/Given 10/16/24 1625)    Clinical Course as of 10/16/24 1714  Fri Oct 16, 2024  1649 WBC(!): 34.2 [JB]  1649 Lactic Acid, Venous(!!): 2.0 [JB]  1713 Glucose(!!): 529 [JB]  1713 Anion gap(!): 17 [JB]    Clinical Course User Index [JB] Verbon Giangregorio, Warren SAILOR, PA-C                                 Medical Decision Making Amount and/or Complexity of Data Reviewed Labs: ordered. Decision-making details documented in ED Course. Radiology: ordered.  Risk Prescription drug management. Decision regarding hospitalization.   This patient presents  to the ED for concern of rash, this involves an extensive number of treatment options, and is a complaint that carries with it a high  risk of complications and morbidity.  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   Additional history obtained:  Additional history obtained from recent wound visit 10/14/2024.  Patient apparently had abnormal vital signs and was reflecting sepsis picture with right foot necrosis.  Encouraged to come here.   Lab Tests:  I personally interpreted labs.  The pertinent results include:   White blood cell count is 34.2 Initial lactic is 2, will get repeat after fluid bolus Blood cultures pending CMP Na 126 likely pseudo due to elevated glucose, glucose 529 with anion gap of 17 Added vbg, beta hydroxybutyric acid    Imaging Studies ordered:  I ordered imaging studies including chest x-ray no acute findings, right foot x-ray I independently visualized and interpreted imaging which showed soft tissue ulceration with soft tissue gas in the lateral right foot, bony destruction of the fifth phalanges, head of fifth metatarsal and head of the fourth metatarsal compatible with osteomyelitis versus septic arthritis. I agree with the radiologist interpretation   Cardiac Monitoring: / EKG:  The patient was maintained on a cardiac monitor.  I personally viewed and interpreted the cardiac monitored which showed an underlying rhythm of: sinus    Consultations Obtained:  I requested consultation with the Dr Reyne,  and discussed lab and imaging findings as well as pertinent plan as I suspect this will need OR management - they will see in consult.  Hospitalist will see in consult, recommends insulin  drip     Problem List / ED Course / Critical interventions / Medication management  Patient is coming in for wound check of right foot.  This has been ongoing for months but has progressed over the last week.  She was seen 2 days ago by wound care who recommended she come in due to concern for sepsis as she reported fatigue and fever and had abnormal vital signs.  Today her systolic has ranged from 80-100 and did respond well to fluids.  She is not tachycardic.  Not febrile.  She does have white count is 34.  I will treat as sepsis and she was given sepsis fluid bolus and started on antibiotic for wound infection. See has obvious necrosis, foul smell and drainage from wound. Her x-ray does confirm that she has significant osteomyelitis as well as soft tissue swelling and soft tissue gas. Discussed with attending who agrees with plan. I discussed with orthopedics.  Patient also found to have hyperglycemia with elevated anion gap of 17.  Does have history of diabetes and is insulin -dependent.  I will place her on insulin  drip and add on beta hydroxybutyric acid as well as VBG. I ordered medication including linezolid, Zosyn recommended by pharmacy.  Received sepsis fluid bolus of 30ml/kg and on fluid maintenance Reevaluation of the patient after these medicines showed that the patient improved I have reviewed the patients home medicines and have made adjustments as needed. Will be admitted to hospital team.        Final diagnoses:  Acute osteomyelitis of phalanx of right foot [facility])    ED Discharge Orders     None          Shermon Warren SAILOR, PA-C 10/16/24 1732    Neysa Caron PARAS, DO 10/21/24 [phone]

## 2024-10-17 ENCOUNTER — Inpatient Hospital Stay (HOSPITAL_COMMUNITY)

## 2024-10-17 DIAGNOSIS — R7881 Bacteremia: Secondary | ICD-10-CM

## 2024-10-17 DIAGNOSIS — E1165 Type 2 diabetes mellitus with hyperglycemia: Secondary | ICD-10-CM | POA: Diagnosis not present

## 2024-10-17 DIAGNOSIS — M869 Osteomyelitis, unspecified: Secondary | ICD-10-CM | POA: Diagnosis not present

## 2024-10-17 DIAGNOSIS — A419 Sepsis, unspecified organism: Secondary | ICD-10-CM | POA: Diagnosis not present

## 2024-10-17 DIAGNOSIS — R6520 Severe sepsis without septic shock: Secondary | ICD-10-CM | POA: Diagnosis not present

## 2024-10-17 LAB — GLUCOSE, CAPILLARY
Glucose-Capillary: 187 mg/dL — ABNORMAL HIGH (ref 70–99)
Glucose-Capillary: 182 mg/dL — ABNORMAL HIGH (ref 70–99)
Glucose-Capillary: 171 mg/dL — ABNORMAL HIGH (ref 70–99)
Glucose-Capillary: 154 mg/dL — ABNORMAL HIGH (ref 70–99)
Glucose-Capillary: 210 mg/dL — ABNORMAL HIGH (ref 70–99)
Glucose-Capillary: 203 mg/dL — ABNORMAL HIGH (ref 70–99)
Glucose-Capillary: 164 mg/dL — ABNORMAL HIGH (ref 70–99)
Glucose-Capillary: 178 mg/dL — ABNORMAL HIGH (ref 70–99)
Glucose-Capillary: 170 mg/dL — ABNORMAL HIGH (ref 70–99)
Glucose-Capillary: 149 mg/dL — ABNORMAL HIGH (ref 70–99)
Glucose-Capillary: 174 mg/dL — ABNORMAL HIGH (ref 70–99)

## 2024-10-17 LAB — GASTROINTESTINAL PANEL BY PCR, STOOL (REPLACES STOOL CULTURE)

## 2024-10-17 LAB — URINALYSIS, W/ REFLEX TO CULTURE (INFECTION SUSPECTED)
Bacteria, UA: NONE SEEN
Bilirubin Urine: NEGATIVE
Bilirubin Urine: NEGATIVE
Glucose, UA: 150 mg/dL — AB
Glucose, UA: >=500 mg/dL — AB
Hgb urine dipstick: NEGATIVE
Ketones, ur: 20 mg/dL — AB
Ketones, ur: NEGATIVE mg/dL
Leukocytes,Ua: NEGATIVE
Nitrite: NEGATIVE
Nitrite: NEGATIVE
Protein, ur: 30 mg/dL — AB
Protein, ur: NEGATIVE mg/dL
Specific Gravity, Urine: 1.026 (ref 1.005–1.030)
Specific Gravity, Urine: 1.027 (ref 1.005–1.030)
pH: 5 (ref 5.0–8.0)
pH: 5 (ref 5.0–8.0)

## 2024-10-17 LAB — ECHOCARDIOGRAM COMPLETE
Area-P 1/2: 4.63 cm2
Height: 60 in
S' Lateral: 2.8 cm
Weight: 1932.99 [oz_av]

## 2024-10-17 LAB — CBC
HCT: 26.5 % — ABNORMAL LOW (ref 36.0–46.0)
Hemoglobin: 8.1 g/dL — ABNORMAL LOW (ref 12.0–15.0)
MCH: 24.3 pg — ABNORMAL LOW (ref 26.0–34.0)
MCHC: 30.6 g/dL (ref 30.0–36.0)
MCV: 79.6 fL — ABNORMAL LOW (ref 80.0–100.0)
Platelets: 462 K/uL — ABNORMAL HIGH (ref 150–400)
RBC: 3.33 MIL/uL — ABNORMAL LOW (ref 3.87–5.11)
RDW: 14.8 % (ref 11.5–15.5)
WBC: 28.1 K/uL — ABNORMAL HIGH (ref 4.0–10.5)
nRBC: 0 % (ref 0.0–0.2)

## 2024-10-17 LAB — BASIC METABOLIC PANEL WITH GFR
Anion gap: 9 (ref 5–15)
Anion gap: 9 (ref 5–15)
BUN: 11 mg/dL (ref 6–20)
BUN: 8 mg/dL (ref 6–20)
CO2: 28 mmol/L (ref 22–32)
CO2: 26 mmol/L (ref 22–32)
Calcium: 8.3 mg/dL — ABNORMAL LOW (ref 8.9–10.3)
Calcium: 8.1 mg/dL — ABNORMAL LOW (ref 8.9–10.3)
Chloride: 96 mmol/L — ABNORMAL LOW (ref 98–111)
Chloride: 97 mmol/L — ABNORMAL LOW (ref 98–111)
Creatinine, Ser: 0.48 mg/dL (ref 0.44–1.00)
Creatinine, Ser: 0.44 mg/dL (ref 0.44–1.00)
GFR, Estimated: >60 mL/min (ref >60)
GFR, Estimated: >60 mL/min (ref >60)
Glucose, Bld: 199 mg/dL — ABNORMAL HIGH (ref 70–99)
Glucose, Bld: 188 mg/dL — ABNORMAL HIGH (ref 70–99)
Potassium: 3 mmol/L — ABNORMAL LOW (ref 3.5–5.1)
Potassium: 3.5 mmol/L (ref 3.5–5.1)
Sodium: 133 mmol/L — ABNORMAL LOW (ref 135–145)
Sodium: 132 mmol/L — ABNORMAL LOW (ref 135–145)

## 2024-10-17 LAB — URINE DRUG SCREEN
Amphetamines: NEGATIVE
Barbiturates: NEGATIVE
Benzodiazepines: NEGATIVE
Cocaine: NEGATIVE
Fentanyl: NEGATIVE
Methadone Scn, Ur: NEGATIVE
Opiates: NEGATIVE
Tetrahydrocannabinol: NEGATIVE

## 2024-10-17 LAB — COMPREHENSIVE METABOLIC PANEL WITH GFR
ALT: 23 U/L (ref 0–44)
AST: 77 U/L — ABNORMAL HIGH (ref 15–41)
Albumin: 2 g/dL — ABNORMAL LOW (ref 3.5–5.0)
Alkaline Phosphatase: 218 U/L — ABNORMAL HIGH (ref 38–126)
Anion gap: 9 (ref 5–15)
BUN: 9 mg/dL (ref 6–20)
CO2: 27 mmol/L (ref 22–32)
Calcium: 8.4 mg/dL — ABNORMAL LOW (ref 8.9–10.3)
Chloride: 95 mmol/L — ABNORMAL LOW (ref 98–111)
Creatinine, Ser: 0.5 mg/dL (ref 0.44–1.00)
GFR, Estimated: >60 mL/min (ref >60)
Glucose, Bld: 182 mg/dL — ABNORMAL HIGH (ref 70–99)
Potassium: 3.2 mmol/L — ABNORMAL LOW (ref 3.5–5.1)
Sodium: 130 mmol/L — ABNORMAL LOW (ref 135–145)
Total Bilirubin: 0.3 mg/dL (ref 0.0–1.2)
Total Protein: 6.8 g/dL (ref 6.5–8.1)

## 2024-10-17 LAB — MAGNESIUM: Magnesium: 1.2 mg/dL — ABNORMAL LOW (ref 1.7–2.4)

## 2024-10-17 LAB — BETA-HYDROXYBUTYRIC ACID
Beta-Hydroxybutyric Acid: 0.06 mmol/L (ref 0.05–0.27)
Beta-Hydroxybutyric Acid: <0.05 mmol/L — ABNORMAL LOW (ref 0.05–0.27)
Beta-Hydroxybutyric Acid: 0.92 mmol/L — ABNORMAL HIGH (ref 0.05–0.27)

## 2024-10-17 LAB — PHOSPHORUS: Phosphorus: 1.5 mg/dL — ABNORMAL LOW (ref 2.5–4.6)

## 2024-10-17 LAB — LACTIC ACID, PLASMA
Lactic Acid, Venous: 1.5 mmol/L (ref 0.5–1.9)
Lactic Acid, Venous: 1.2 mmol/L (ref 0.5–1.9)

## 2024-10-17 MED ORDER — LACTATED RINGERS IV BOLUS
500.0000 mL | Freq: Once | INTRAVENOUS | Status: AC
  Administered 2024-10-17: 500 mL via INTRAVENOUS

## 2024-10-17 MED ORDER — AZITHROMYCIN 500 MG PO TABS
500.0000 mg | ORAL_TABLET | Freq: Every day | ORAL | Status: AC
  Administered 2024-10-17 – 2024-10-19 (×3): 500 mg via ORAL
  Filled 2024-10-17 (×3): qty 1

## 2024-10-17 MED ORDER — SODIUM PHOSPHATES 45 MMOLE/15ML IV SOLN
30.0000 mmol | Freq: Once | INTRAVENOUS | Status: AC
  Administered 2024-10-17: 30 mmol via INTRAVENOUS
  Filled 2024-10-17: qty 10

## 2024-10-17 MED ORDER — LORAZEPAM 2 MG/ML IJ SOLN
0.5000 mg | Freq: Once | INTRAMUSCULAR | Status: AC | PRN
  Administered 2024-10-17: 0.5 mg via INTRAVENOUS
  Filled 2024-10-17: qty 1

## 2024-10-17 MED ORDER — INSULIN ASPART 100 UNIT/ML IJ SOLN
0.0000 [IU] | INTRAMUSCULAR | Status: DC
  Administered 2024-10-17 – 2024-10-18 (×3): 2 [IU] via SUBCUTANEOUS
  Administered 2024-10-18 (×2): 3 [IU] via SUBCUTANEOUS
  Administered 2024-10-18 – 2024-10-19 (×3): 2 [IU] via SUBCUTANEOUS
  Administered 2024-10-19 (×2): 3 [IU] via SUBCUTANEOUS
  Administered 2024-10-20 (×3): 2 [IU] via SUBCUTANEOUS
  Administered 2024-10-21: 3 [IU] via SUBCUTANEOUS
  Administered 2024-10-21: 5 [IU] via SUBCUTANEOUS
  Administered 2024-10-21: 2 [IU] via SUBCUTANEOUS
  Administered 2024-10-21 – 2024-10-22 (×2): 3 [IU] via SUBCUTANEOUS
  Filled 2024-10-17: qty 3
  Filled 2024-10-17: qty 2
  Filled 2024-10-17 (×2): qty 3
  Filled 2024-10-17: qty 5
  Filled 2024-10-17: qty 3
  Filled 2024-10-17: qty 2
  Filled 2024-10-17: qty 3
  Filled 2024-10-17 (×2): qty 2

## 2024-10-17 MED ORDER — POTASSIUM CHLORIDE 10 MEQ/100ML IV SOLN
10.0000 meq | INTRAVENOUS | Status: AC
  Administered 2024-10-17 (×4): 10 meq via INTRAVENOUS
  Filled 2024-10-17 (×4): qty 100

## 2024-10-17 MED ORDER — MAGNESIUM SULFATE 4 GM/100ML IV SOLN
4.0000 g | Freq: Once | INTRAVENOUS | Status: AC
  Administered 2024-10-17: 4 g via INTRAVENOUS
  Filled 2024-10-17: qty 100

## 2024-10-17 MED ORDER — SODIUM CHLORIDE 0.9 % IV BOLUS
1000.0000 mL | Freq: Once | INTRAVENOUS | Status: AC
  Administered 2024-10-17: 1000 mL via INTRAVENOUS

## 2024-10-17 MED ORDER — MIDODRINE HCL 5 MG PO TABS
2.5000 mg | ORAL_TABLET | Freq: Three times a day (TID) | ORAL | Status: DC
  Administered 2024-10-17 – 2024-10-19 (×3): 2.5 mg via ORAL
  Filled 2024-10-17 (×5): qty 1

## 2024-10-17 MED ORDER — INSULIN GLARGINE-YFGN 100 UNIT/ML ~~LOC~~ SOLN
15.0000 [IU] | Freq: Every day | SUBCUTANEOUS | Status: DC
  Administered 2024-10-17 – 2024-10-22 (×6): 15 [IU] via SUBCUTANEOUS
  Filled 2024-10-17 (×6): qty 0.15

## 2024-10-17 NOTE — Progress Notes (Signed)
  Echocardiogram 2D Echocardiogram has been performed.  Koleen KANDICE Popper, RDCS 10/17/2024, 1:12 PM

## 2024-10-17 NOTE — Progress Notes (Addendum)
 PROGRESS NOTE    Phyllis Pittman  FMW:[id number] DOB: June 07, 1976 DOA: 10/16/2024 PCP: [name], PA   Brief Narrative:  Phyllis Pittman is a 48 y.o. female with medical history significant for anxiety, history of CVA, depression with anxiety, diabetes mellitus type 2 which is uncontrolled, hypertension, hyperlipidemia GERD, migraines, neuropathy, sciatica as well as other comorbidities who was recently treated for a left foot infection and had a PICC line.  She went to go see her wound care specialist last week and was reportedly found to have a new wound with a fever at that time.  At that time her right foot was swollen and erythematous up to the ankle with congested discolored small toe with multiple open wounds and had a unstable lateral aspect concerning small toe as well as circumferential blistering of the fourth webspace.  On 10/07/2024 she was advised to go to the emergency room for further evaluation and antibiotics and formal debridement.  She did not go to the ER and kept her Vashe wet-to-dry dressings in place and went back to the wound care center on 10/14/2024 where she was again advised to go to the ED given that her foot had developed and necrotic duskiness and dark eschar on both the plantar and dorsal surface 6 as well as an extensive order.     Patient's Wound care physician had found that the wound had progressively gotten worse and she was noted to be lethargic with malaise.  EMS was called during this clinic visit but she refused to go with EMS to the hospital for further evaluation.  Subsequently she left the clinic despite the advice of the wound care physician to go to the ED.  She now presents 2 days later with worsening foul-smelling drainage, pain and development of a facial rash since Sunday.  She continues to complain of pain and feeling ill.  Denies chest pain, shortness of breath but states that she has been having diarrhea and abdominal discomfort and attributes  it to her history of IBS.  Also complains of new facial erythema and swelling and thinks it is because she dropped her phone on her face when laying in bed.  Denies any other concerns or complaints at this time.   **Interim History Diarrhea tested positive for EPEC so we will add p.o. azithromycin.  For 3 days.  Insulin  drip was weaned and she was transitioned to long-acting insulin .  Diet was advanced.  Orthopedic surgery planning on doing surgical intervention sometime later this week but wanted vascular studies first and MRI imaging.  Will transfer the patient to [facility] and have vascular surgery evaluate the patient in case any intervention is needed prior to likely surgical form of amputation in the mid or hindfoot or require BKA.  Assessment and Plan:  Severe Sepsis 2/2 Right Foot Cellulitis and Osteomyelitis w/ Concern for Necrotizing Fasciitis and Gas Gangrene: Status post 2 L boluses and placed on maintenance IV fluid hydration and will give another 1 L bolus now.  Blood pressure has been borderline low.  Admitted to the stepdown unit feel that she may have a disseminated infection so checking blood cultures x 2 and is are pending.   -Foot x-ray done and showed:  Soft tissue ulceration involving the lateral forefoot, with extensive soft tissue gas throughout the lateral forefoot and midfoot. Findings are consistent with cellulitis and infection with gas-forming organism. Areas of bony destruction involving the fifth digit phalanges, head of the fifth metatarsal, and head of the  fourth metatarsal, compatible with osteomyelitis and possible septic arthritis.  No acute fracture.  -Checking CT cervical spine and CT maxillofacial with contrast and there is below.   -Checked echocardiogram and was negative for any valvular vegetations.  Follow cultures.   -Check urinalysis and urine culture.  Chest x-ray was negative.  IV fluid hydration has now stopped -Discussed with infectious disease and they  are recommending IV antibiotic coverage with vancomycin, linezolid and Unasyn.  I am concerned given her extent of infection and her LRINEC score is 11 which is very concerning for necrotizing fasciitis -Given her significant lower extremity wounds and ulcers will checking vascular despite her having a DP. I spoke with Dr. Pearline of the vascular surgery team and given her likely surgical amputation and intervention will transfer to [facility] for further vascular evaluation in case any intervention is needed prior to surgery; currently her vascular ABI with and without TBI as well as ultrasound lower extremity arterial duplex is still pending; -Orthopedic surgery consulted and evaluated Dr. Reyne recommending obtaining MRI of the foot this has been ordered and pending; currently they do not recommend surgical intervention at this time but feel that they are can allow the wound to demarcate and allow time for the vascular studies and vascular evaluation to be done anticipation for surgical intervention early next week as she will require some form of amputation in the mid or hindfoot or require BKA and this is still to be determined -Check procalcitonin of 0.60 and trend lactic acid level; was 2.0 on admission and improved to 1.2. WBC was 34.2 and has been having intermittent fevers w/ Fever being 101 at home -WBC Trend:  Recent Labs  Lab 10/16/24 1632 10/16/24 2112 10/17/24 0723  WBC 34.2* 32.2* 28.1*  -ESR is 137 and CRP is 34.6 -ID to evaluate and will see the patient in the a.m.  Uncontrolled Diabetes Mellitus Type 2 with significant hyperglycemia: Not acidotic on admission but will initiate insulin  drip and bolus IV fluids as above.  Check a urinalysis and a beta-hydroxybutyrate acid.  Transitioned off of insulin  drip since blood sugars are better controlled.  Hemoglobin A1c still pending.  Given that she is not going for intervention at this time will place on a carb modified diet.  Added  long-acting and a moderate NovoLog  sliding scale insulin  every 4  Facial Erythema and Swelling/ Malar Rash: Checked Maxillofacial CT and showed Subcutaneous fat stranding throughout much of the facial soft tissues, worst in the right lower face, consistent with facial cellulitis.  No abscess or drainable fluid collection. No clear infectious source identified. -Will consider Autoimmune testing if her cellulitis is not improving with antibiotics   Elevated Anion Gap: Ordered for a VBG but this was never obtained.  Her CO2 is now 26, anion gap is improved and resolved at 9 and a chloride level is now 97.  Culture monitor and trend and repeat CMP in a.m.  Diarrhea / Hx of IBS: Check for infectious diarrhea etiology with C. difficile and GI pathogen panel.  GI pathogen is positive for EPEC.  Will start p.o. azithromycin.  C. difficile still pending.  ID has been consulted.  She now has a Flexi-Seal   Pseudohyponatremia -> Hyponatremia: Na+ is now 132.  Glucose is now 188.  Now she is off the insulin  drip   Hx of CVA: Hold ASA for now in case she goes for Surgical Intervention   Essential HTN: Not on any Antihypertensives at home and currently  BP is soft.  Added midodrine overnight.  Continue IV fluid and give another bolus.  Continue monitor blood pressure per protocol.  Last blood pressure reading was still on the softer side 101/63   Hypercalcemia: Improved.  Patient's calcium level is now 8.1 with a corrected calcium level of 9.7 for albumin of 2.  Hypophosphatemia: Phos level is now 1.5 and will replete with IV K-Phos 30 mmol.  Continue monitor and replete as necessary  Hypomagnesemia: Mag level is now 1.2.  Replete with IV mag sulfate 4 g.  Can to monitor replete as necessary.  Repeat magnesium level in a.m.   Neck Pain: Check Neck CT w Contrast to evaluate and it showed Mild subcutaneous fat stranding within the posterior right neck without abscess or fluid collection.Anterior neck not  evaluated on this study due to exclusion from the field of view.  No acute abnormality of the cervical spine. -C/w Pain Control   Depression and anxiety and mood disorder: Continue with buspirone  10 mg p.o. daily, bupropion  300 p.o. nightly, quetiapine  50 mg p.o. daily; Resume Alprazolam    Microcytic Anemia: Hgb/Hct is now trending down and went from 10.9/35.9 and is now 8.1/26.5 with an MCV of 79.6 check Anemia Panel in the AM. CTM for S/Sx of Bleeding; No overt bleeding noted. Repeat CBC in the AM   Multiple Wounds/Sacral Wound: WOC Nurse Consult and appreciate the recommendations.  Given her incontinence and her multiple diarrheal episodes will place a Foley catheter to prevent swelling as well as use of Flexi-Seal for the diarrhea that she is having.   History of POTS: IV fluid hydration and monitoring of blood pressure carefully.  Given her leg wounds unable to use compression stockings.  Continue with antidepressants as above; recommending avoiding caffeine and alcohol   Hypoalbuminemia: Patient's Albumin Lvl is now 2.0. CTM and Trend and repeat CMP in the AM    DVT prophylaxis: enoxaparin  (LOVENOX ) injection 40 mg Start: 10/16/24 2000 SCDs Start: 10/16/24 1750 SCDs Start: 10/16/24 1749    Code Status: Full Code Family Communication: No family currently at bedside  Disposition Plan:  Level of care: Progressive Status is: Inpatient Remains inpatient appropriate because: Needs further clinical improvement and clearance by the specialists; transferring the patient to [facility] for further vascular evaluation in case any intervention is needed prior to surgical intervention next week   Consultants:  Infectious diseases Vascular surgery Orthopedic surgery  Procedures:  As delineated as above  Antimicrobials:  Anti-infectives (From admission, onward)    Start     Dose/Rate Route Frequency Ordered Stop   10/17/24 1945  Vancomycin (VANCOCIN) 1,250 mg in sodium chloride  0.9 % 250  mL IVPB  Status:  Discontinued       Placed in Followed by Linked Group   1,250 mg 166.7 mL/hr over 90 Minutes Intravenous Every 24 hours 10/16/24 1845 10/16/24 1847   10/17/24 1945  vancomycin (VANCOREADY) IVPB 1250 mg/250 mL       Placed in Followed by Linked Group   1,250 mg 166.7 mL/hr over 90 Minutes Intravenous Every 24 hours 10/16/24 1848     10/17/24 1800  azithromycin (ZITHROMAX) tablet 500 mg        500 mg Oral Daily 10/17/24 1709 10/20/24 0959   10/17/24 0530  linezolid (ZYVOX) IVPB 600 mg        600 mg 300 mL/hr over 60 Minutes Intravenous Every 12 hours 10/16/24 1752 10/27/24 0529   10/17/24 0130  Ampicillin-Sulbactam (UNASYN) 3 g in sodium  chloride 0.9 % 100 mL IVPB        3 g 200 mL/hr over 30 Minutes Intravenous Every 6 hours 10/16/24 1845     10/16/24 2000  meropenem (MERREM) 1 g in sodium chloride  0.9 % 100 mL IVPB  Status:  Discontinued        1 g 200 mL/hr over 30 Minutes Intravenous Every 8 hours 10/16/24 1825 10/16/24 1831   10/16/24 1945  vancomycin (VANCOREADY) IVPB 1500 mg/300 mL       Placed in Followed by Linked Group   1,500 mg 150 mL/hr over 120 Minutes Intravenous  Once 10/16/24 1848 10/16/24 2249   10/16/24 1930  Vancomycin (VANCOCIN) 1,500 mg in sodium chloride  0.9 % 500 mL IVPB  Status:  Discontinued       Placed in Followed by Linked Group   1,500 mg 250 mL/hr over 120 Minutes Intravenous  Once 10/16/24 1845 10/16/24 1847   10/16/24 1800  valACYclovir  (VALTREX ) tablet 500 mg  Status:  Discontinued        500 mg Oral Daily 10/16/24 1752 10/16/24 2206   10/16/24 1800  meropenem (MERREM) 1 g in sodium chloride  0.9 % 100 mL IVPB  Status:  Discontinued        1 g 200 mL/hr over 30 Minutes Intravenous  Once 10/16/24 1752 10/16/24 1817   10/16/24 1600  Vancomycin (VANCOCIN) 1,500 mg in sodium chloride  0.9 % 500 mL IVPB  Status:  Discontinued        1,500 mg 250 mL/hr over 120 Minutes Intravenous  Once 10/16/24 1506 10/16/24 1512   10/16/24 1600   Vancomycin (VANCOCIN) 1,500 mg in sodium chloride  0.9 % 500 mL IVPB  Status:  Discontinued        1,500 mg 250 mL/hr over 120 Minutes Intravenous  Once 10/16/24 1512 10/16/24 1525   10/16/24 1600  linezolid (ZYVOX) IVPB 600 mg        600 mg 300 mL/hr over 60 Minutes Intravenous  Once 10/16/24 1525 10/16/24 1835   10/16/24 1530  piperacillin-tazobactam (ZOSYN) IVPB 3.375 g        3.375 g 100 mL/hr over 30 Minutes Intravenous  Once 10/16/24 1506 10/16/24 1751       Subjective: Seen and examined at bedside and stool ball started.  Nursing states that she is incontinent and having multiple bowel movements.  Will place a Foley catheter and Flexi-Seal given that she has a sacral wound.  Blood sugars are improved and she was transitioned off insulin  drip.  Patient states that she is not feeling any better.  She denies other concerns or complaints at this time.  Objective: Vitals:   10/17/24 1258 10/17/24 1300 10/17/24 1330 10/17/24 1449  BP: (!) 86/46 (!) 86/46  101/63  Pulse: 76 76 78 81  Resp: 19 19 19 18   Temp: 98.8 F (37.1 C)   98.1 F (36.7 C)  TempSrc: Oral   Oral  SpO2: 94% 94% 93% 92%  Weight:    63.5 kg  Height:        Intake/Output Summary (Last 24 hours) at 10/17/2024 1928 Last data filed at 10/17/2024 1208 Gross per 24 hour  Intake 7119.12 ml  Output --  Net 7119.12 ml   Filed Weights   10/16/24 1320 10/16/24 1832 10/17/24 1449  Weight: 65.8 kg 54.8 kg 63.5 kg   Examination: Physical Exam:  Constitutional: Chronically ill-appearing older appearing than her stated age Caucasian female who appears acutely ill and little somnolent Respiratory: Diminished  to auscultation bilaterally, no wheezing, rales, rhonchi or crackles. Normal respiratory effort and patient is not tachypenic. No accessory muscle use.  Cardiovascular: RRR, no murmurs / rubs / gallops. S1 and S2 auscultated.  She has swelling of her right foot Abdomen: Soft, non-tender, non-distended.  Bowel sounds  positive.  GU: Deferred. Musculoskeletal: Left foot amputation noted.  Right foot has bony destruction and skin changes as below Skin: Chronic large appearing wound on the right foot with erythema and bony destruction with a missing fifth digit.  Has a sacral wound and facial erythema and puffiness and swelling in the malar region which is painful to palpate on the right.  Also has some dermatitis on her hands Neurologic: CN 2-12 grossly intact with no focal deficits.  Romberg sign cerebellar reflexes not assessed.  Psychiatric: Appears a little drowsy but arousable  Data Reviewed: I have personally reviewed following labs and imaging studies  CBC: Recent Labs  Lab 10/16/24 1632 10/16/24 2112 10/17/24 0723  WBC 34.2* 32.2* 28.1*  NEUTROABS 29.3*  --   --   HGB 10.9* 9.3* 8.1*  HCT 35.9* 30.1* 26.5*  MCV 78.6* 78.8* 79.6*  PLT 552* 523* 462*   Basic Metabolic Panel: Recent Labs  Lab 10/16/24 1632 10/16/24 2112 10/17/24 0253 10/17/24 0723 10/17/24 0920  NA 126* 130* 133* 130* 132*  K 3.9 3.2* 3.0* 3.2* 3.5  CL 80* 88* 96* 95* 97*  CO2 30 28 28 27 26   GLUCOSE 529* 286* 199* 182* 188*  BUN 20 15 11 9 8   CREATININE 0.79 0.68 0.48 0.50 0.44  CALCIUM 10.5* 9.2 8.3* 8.4* 8.1*  MG  --  1.4*  --   --  1.2*  PHOS  --  1.8*  --   --  1.5*   GFR: Estimated Creatinine Clearance: 72.3 mL/min (by C-G formula based on SCr of 0.44 mg/dL). Liver Function Tests: Recent Labs  Lab 10/16/24 1632 10/17/24 0723  AST 37 77*  ALT 22 23  ALKPHOS 266* 218*  BILITOT 0.5 0.3  PROT 10.2* 6.8  ALBUMIN 3.1* 2.0*   No results for input(s): LIPASE, AMYLASE in the last 168 hours. No results for input(s): AMMONIA in the last 168 hours. Coagulation Profile: Recent Labs  Lab 10/16/24 1632  INR 1.2   Cardiac Enzymes: No results for input(s): CKTOTAL, CKMB, CKMBINDEX, TROPONINI in the last 168 hours. BNP (last 3 results) No results for input(s): PROBNP in the last 8760  hours. HbA1C: No results for input(s): HGBA1C in the last 72 hours. CBG: Recent Labs  Lab 10/17/24 0652 10/17/24 0749 10/17/24 0849 10/17/24 1111 10/17/24 1452  GLUCAP 203* 164* 178* 170* 149*   Lipid Profile: No results for input(s): CHOL, HDL, LDLCALC, TRIG, CHOLHDL, LDLDIRECT in the last 72 hours. Thyroid  Function Tests: Recent Labs    10/16/24 2112  TSH 0.467   Anemia Panel: No results for input(s): VITAMINB12, FOLATE, FERRITIN, TIBC, IRON, RETICCTPCT in the last 72 hours. Sepsis Labs: Recent Labs  Lab 10/16/24 1633 10/16/24 2112 10/17/24 0920 10/17/24 1321  PROCALCITON  --  0.60  --   --   LATICACIDVEN 2.0*  --  1.5 1.2   Recent Results (from the past 240 hours)  Blood Culture (routine x 2)     Status: None (Preliminary result)   Collection Time: 10/16/24  4:25 PM   Specimen: BLOOD RIGHT ARM  Result Value Ref Range Status   Specimen Description   Final    BLOOD RIGHT ARM Performed at [facility], [address], [place], [place] [address]    Special Requests   Final    BOTTLES DRAWN AEROBIC AND ANAEROBIC Blood Culture adequate volume Performed at [facility], [address] [place], [place] [address]    Culture   Final    NO GROWTH < 12 HOURS Performed at [facility], [address], [place], [place] [address]    Report Status PENDING  Incomplete  MRSA Next Gen by PCR, Nasal     Status: None   Collection Time: 10/16/24  6:51 PM   Specimen: Nasal Mucosa; Nasal Swab  Result Value Ref Range Status   MRSA by PCR Next Gen NOT DETECTED NOT DETECTED Final    Comment: (NOTE) The GeneXpert MRSA Assay (FDA approved for NASAL specimens only), is one component of a comprehensive MRSA colonization surveillance program. It is not intended to diagnose MRSA infection nor to guide or monitor treatment for MRSA infections. Test performance is not FDA approved in patients less than 24 years old. Performed at  [facility], [address] [place], [place] [address]   Blood Culture (routine x 2)     Status: None (Preliminary result)   Collection Time: 10/16/24  9:12 PM   Specimen: BLOOD RIGHT HAND  Result Value Ref Range Status   Specimen Description   Final    BLOOD RIGHT HAND Performed at [facility], [address], [place], [place] [address]    Special Requests   Final    BOTTLES DRAWN AEROBIC AND ANAEROBIC Blood Culture results may not be optimal due to an inadequate volume of blood received in culture bottles Performed at [facility], [address], [place], [place] [address]    Culture   Final    NO GROWTH < 12 HOURS Performed at [facility], [address] [place], [place] [address]    Report Status PENDING  Incomplete  Gastrointestinal Panel by PCR , Stool     Status: Abnormal   Collection Time: 10/17/24  6:59 AM   Specimen: Stool  Result Value Ref Range Status   Campylobacter species NOT DETECTED NOT DETECTED Final   Plesimonas shigelloides NOT DETECTED NOT DETECTED Final   Salmonella species NOT DETECTED NOT DETECTED Final   Yersinia enterocolitica NOT DETECTED NOT DETECTED Final   Vibrio species NOT DETECTED NOT DETECTED Final   Vibrio cholerae NOT DETECTED NOT DETECTED Final   Enteroaggregative E coli (EAEC) NOT DETECTED NOT DETECTED Final   Enteropathogenic E coli (EPEC) DETECTED (A) NOT DETECTED Final    Comment: RESULT CALLED TO, READ BACK BY AND VERIFIED WITH: MICHELLE B., RN AT 1648 10/17/24 RAM    Enterotoxigenic E coli (ETEC) NOT DETECTED NOT DETECTED Final   Shiga like toxin producing E coli (STEC) NOT DETECTED NOT DETECTED Final   Shigella/Enteroinvasive E coli (EIEC) NOT DETECTED NOT DETECTED Final   Cryptosporidium NOT DETECTED NOT DETECTED Final   Cyclospora cayetanensis NOT DETECTED NOT DETECTED Final   Entamoeba histolytica NOT DETECTED NOT DETECTED Final   Giardia lamblia NOT DETECTED NOT DETECTED  Final   Adenovirus F40/41 NOT DETECTED NOT DETECTED Final   Astrovirus NOT DETECTED NOT DETECTED Final   Norovirus GI/GII NOT DETECTED NOT DETECTED Final   Rotavirus A NOT DETECTED NOT DETECTED Final   Sapovirus (I, II, IV, and V) NOT DETECTED NOT DETECTED Final    Comment: Performed at [facility], [address] [place], [place]  [address]    Radiology Studies: ECHOCARDIOGRAM COMPLETE Result Date: 10/17/2024    ECHOCARDIOGRAM REPORT   Patient Name:   GLORIAN MCDONELL Date of Exam: 10/17/2024 Medical Rec #:  [id number]       Height:       60.0 in Accession #:    [id number]      Weight:       120.8 lb Date of Birth:  20-Jun-1976       BSA:          1.507 m Patient Age:    48 years        BP:           91/42 mmHg Patient Gender: F               HR:           76 bpm. Exam Location:  Inpatient Procedure: 2D Echo, Cardiac Doppler and Color Doppler (Both Spectral and Color            Flow Doppler were utilized during procedure). Indications:    Bacteremia R78.81  History:        Patient has no prior history of Echocardiogram examinations. Hx                 of CVA, Signs/Symptoms:Bacteremia; Risk Factors:Diabetes,                 Hypertension, Dyslipidemia and Sleep Apnea.  Sonographer:    Koleen Popper RDCS Referring Phys: [id number] ALEJANDRO LATIF [facility] IMPRESSIONS  1. Left ventricular ejection fraction, by estimation, is 60 to 65%. The left ventricle has normal function. The left ventricle has no regional wall motion abnormalities. Left ventricular diastolic parameters were normal.  2. Right ventricular systolic function is normal. The right ventricular size is normal. There is normal pulmonary artery systolic pressure.  3. The mitral valve is normal in structure. No evidence of mitral valve regurgitation. No evidence of mitral stenosis.  4. The aortic valve has an indeterminant number of cusps. Aortic valve regurgitation is not visualized. No aortic stenosis is present.  5. The inferior vena cava is  dilated in size with >50% respiratory variability, suggesting right atrial pressure of 8 mmHg. Comparison(s): No prior Echocardiogram. Conclusion(s)/Recommendation(s): No evidence of valvular vegetations on this transthoracic echocardiogram. Consider a transesophageal echocardiogram to exclude infective endocarditis if clinically indicated. FINDINGS  Left Ventricle: Left ventricular ejection fraction, by estimation, is 60 to 65%. The left ventricle has normal function. The left ventricle has no regional wall motion abnormalities. Strain was performed and the global longitudinal strain is indeterminate. The left ventricular internal cavity size was normal in size. There is no left ventricular hypertrophy. Left ventricular diastolic parameters were normal. Right Ventricle: The right ventricular size is normal. No increase in right ventricular wall thickness. Right ventricular systolic function is normal. There is normal pulmonary artery systolic pressure. The tricuspid regurgitant velocity is 2.46 m/s, and  with an assumed right atrial pressure of 8 mmHg, the estimated right ventricular systolic pressure is 32.2 mmHg. Left Atrium: Left atrial size was normal in size. Right Atrium: Right atrial size was normal in size. Pericardium: There is no evidence of pericardial effusion. Mitral Valve: The mitral valve is normal in structure. No evidence of mitral valve regurgitation. No evidence of mitral valve stenosis. Tricuspid Valve: The tricuspid valve is normal in structure. Tricuspid valve regurgitation is trivial. No evidence of tricuspid stenosis. Aortic Valve: The aortic valve has an indeterminant number of cusps. Aortic valve  regurgitation is not visualized. No aortic stenosis is present. Pulmonic Valve: The pulmonic valve was grossly normal. Pulmonic valve regurgitation is not visualized. No evidence of pulmonic stenosis. Aorta: The aortic root and ascending aorta are structurally normal, with no evidence of  dilitation. Venous: The inferior vena cava is dilated in size with greater than 50% respiratory variability, suggesting right atrial pressure of 8 mmHg. IAS/Shunts: No atrial level shunt detected by color flow Doppler. Additional Comments: 3D was performed not requiring image post processing on an independent workstation and was indeterminate.  LEFT VENTRICLE PLAX 2D LVIDd:         4.50 cm   Diastology LVIDs:         2.80 cm   LV e' medial:    11.10 cm/s LV PW:         0.70 cm   LV E/e' medial:  11.2 LV IVS:        0.90 cm   LV e' lateral:   12.70 cm/s LVOT diam:     1.80 cm   LV E/e' lateral: 9.8 LV SV:         53 LV SV Index:   35 LVOT Area:     2.54 cm  RIGHT VENTRICLE             IVC RV Basal diam:  3.40 cm     IVC diam: 2.50 cm RV S prime:     12.80 cm/s TAPSE (M-mode): 3.2 cm LEFT ATRIUM             Index        RIGHT ATRIUM          Index LA diam:        3.10 cm 2.06 cm/m   RA Area:     8.54 cm LA Vol (A2C):   23.1 ml 15.33 ml/m  RA Volume:   14.00 ml 9.29 ml/m LA Vol (A4C):   27.3 ml 18.12 ml/m LA Biplane Vol: 25.7 ml 17.06 ml/m  AORTIC VALVE LVOT Vmax:   121.00 cm/s LVOT Vmean:  79.000 cm/s LVOT VTI:    0.210 m  AORTA Ao Root diam: 2.30 cm Ao Asc diam:  2.50 cm MITRAL VALVE                TRICUSPID VALVE MV Area (PHT): 4.63 cm     TR Peak grad:   24.2 mmHg MV Decel Time: 164 msec     TR Vmax:        246.00 cm/s MV E velocity: 124.00 cm/s MV A velocity: 67.90 cm/s   SHUNTS MV E/A ratio:  1.83         Systemic VTI:  0.21 m                             Systemic Diam: 1.80 cm Vishnu Priya Mallipeddi Electronically signed by Diannah Late Mallipeddi Signature Date/Time: 10/17/2024/1:19:24 PM    Final    CT CERVICAL SPINE W CONTRAST Result Date: 10/17/2024 EXAM: CT CERVICAL SPINE WITH CONTRAST 10/17/2024 11:54:53 AM TECHNIQUE: CT of the cervical spine was performed with the administration of 100 mL of iohexol  (OMNIPAQUE ) 300 MG/ML solution. Multiplanar reformatted images are provided for review. Automated  exposure control, iterative reconstruction, and/or weight based adjustment of the mA/kV was utilized to reduce the radiation dose to as low as reasonably achievable. COMPARISON: None available. CLINICAL HISTORY: Neck pain, infection suspected, no prior imaging. FINDINGS:  CERVICAL SPINE: BONES AND ALIGNMENT: No acute fracture or traumatic malalignment. DEGENERATIVE CHANGES: No significant degenerative changes. SOFT TISSUES: Limited soft tissue imaging on the CT of the cervical spine. There is mild subcutaneous fat stranding within the posterior right neck. No abscess or fluid collection. Anterior neck is excluded from view. IMPRESSION: 1. Mild subcutaneous fat stranding within the posterior right neck without abscess or fluid collection. 2. Anterior neck not evaluated on this study due to exclusion from the field of view. 3. No acute abnormality of the cervical spine. Electronically signed by: Franky Stanford MD 10/17/2024 12:06 PM EDT RP Workstation: [id number]   CT MAXILLOFACIAL W CONTRAST Result Date: 10/17/2024 EXAM: CT Face with contrast 10/17/2024 11:54:53 AM TECHNIQUE: CT of the face was performed with the administration of 100 mL of iohexol  (OMNIPAQUE ) 300 MG/ML solution. Multiplanar reformatted images are provided for review. Automated exposure control, iterative reconstruction, and/or weight based adjustment of the mA/kV was utilized to reduce the radiation dose to as low as reasonably achievable. COMPARISON: None available CLINICAL HISTORY: Facial Cellulitis FINDINGS: AERODIGESTIVE TRACT: No mass. No edema. SALIVARY GLANDS: No acute abnormality. LYMPH NODES: No suspicious cervical lymphadenopathy. SOFT TISSUES: Subcutaneous fat stranding is present throughout much of the facial soft tissues, worse in the right lower face. No abscess or drainable fluid collection. No clear infectious source. BRAIN, ORBITS AND SINUSES: No acute abnormality. BONES: No acute abnormality. No suspicious bone lesion. IMPRESSION:  1. Subcutaneous fat stranding throughout much of the facial soft tissues, worst in the right lower face, consistent with facial cellulitis. 2. No abscess or drainable fluid collection. 3. No clear infectious source identified. Electronically signed by: Franky Stanford MD 10/17/2024 12:03 PM EDT RP Workstation: [id number]   DG Foot Complete Right Result Date: 10/16/2024 CLINICAL DATA:  Right foot ulcer EXAM: RIGHT FOOT COMPLETE - 3+ VIEW COMPARISON:  None Available. FINDINGS: Frontal, oblique, lateral views of the right foot are obtained. Large area of soft tissue ulceration within the lateral forefoot, with extensive subcutaneous gas throughout the forefoot and midfoot consistent with ulceration and cellulitis with gas-forming organism. There are areas of focal bony destruction involving the fifth digit phalanges, head of the fifth metatarsal, and head of the fourth metatarsal, consistent with osteomyelitis and septic arthritis. There are no acute displaced fractures. IMPRESSION: 1. Soft tissue ulceration involving the lateral forefoot, with extensive soft tissue gas throughout the lateral forefoot and midfoot. Findings are consistent with cellulitis and infection with gas-forming organism. 2. Areas of bony destruction involving the fifth digit phalanges, head of the fifth metatarsal, and head of the fourth metatarsal, compatible with osteomyelitis and possible septic arthritis. 3. No acute fracture. Electronically Signed   By: Ozell Daring M.D.   On: 10/16/2024 16:23   DG Chest Port 1 View Result Date: 10/16/2024 CLINICAL DATA:  Sepsis EXAM: PORTABLE CHEST 1 VIEW COMPARISON:  07/01/2024 FINDINGS: The heart size and mediastinal contours are within normal limits. Both lungs are clear. The visualized skeletal structures are unremarkable. IMPRESSION: No active disease. Electronically Signed   By: Ozell Daring M.D.   On: 10/16/2024 16:21   Scheduled Meds:  azithromycin  500 mg Oral Daily   Chlorhexidine  Gluconate Cloth  6 each Topical Daily   collagenase   Topical Daily   enoxaparin  (LOVENOX ) injection  40 mg Subcutaneous Q24H   hydrOXYzine   50 mg Oral BID   insulin  aspart  0-9 Units Subcutaneous Q4H   insulin  glargine-yfgn  15 Units Subcutaneous Daily   midodrine  2.5 mg Oral  TID WC   Continuous Infusions:  ampicillin-sulbactam (UNASYN) IV 3 g (10/17/24 1850)   linezolid (ZYVOX) IV Stopped (10/17/24 0610)   vancomycin      LOS: 1 day   Alejandro Marker, DO [facility] Available via Epic secure chat 7am-7pm After these hours, please refer to coverage provider listed on [email or web address] 10/17/2024, 7:28 PM

## 2024-10-17 NOTE — Consult Note (Signed)
 Orthopedic Consult  Patient ID: Phyllis Pittman MRN: [id number] DOB/AGE: 1976/10/24 48 y.o.  Reason for Consult: Right foot infection Referring Physician: Sherrill  HPI: Phyllis Pittman is an 48 y.o. female with history of diabetes and prior infections on her left foot.  Approximately 2 months ago she dropped feeling problems on her right foot.  She reports that shortly after she got the hospital for an infection of the left foot.  She was timely to treat this as an outpatient.  She was doing local wound care by herself.  She was not on any antibiotics as an outpatient.  The wound in her right foot became progressively worse.  She was seen by [facility] wound care who recommended that she come to the hospital entrance into the emergency room yesterday.  She reports a temperature at home of 101.  She also notes that she has had a sacral decubitus ulcer as well as multiple areas of dermatitis of her bilateral upper extremities.  Past Medical History:  Diagnosis Date   Anxiety    Cancer (HCC)    neoplasm of cervix   CVA (cerebral vascular accident) (HCC)    hx of   Depression with anxiety    Diabetes mellitus without complication (HCC)    type 2   Female hirsutism    Fibromyalgia    GERD without esophagitis    Hyperlipidemia    Hypertension    IBS (irritable bowel syndrome)    Long term (current) use of insulin  (HCC)    Migraine    Myopia of both eyes    Neuropathy    feet   Neuropathy    OSA (obstructive sleep apnea)    Sciatica    Shingles     Past Surgical History:  Procedure Laterality Date   CESAREAN SECTION     05/2001, 07/2012   CHOLECYSTECTOMY  10/1999   FACIAL COSMETIC SURGERY     FOOT SURGERY      Family History  Problem Relation Age of Onset   COPD Mother    Diabetes Mother    Cancer Mother    Diabetes Father    Cancer Father     Social History:  reports that she has been smoking cigarettes. She has never used smokeless tobacco. She reports current alcohol  use. She reports that she does not use drugs.  Allergies:  Allergies  Allergen Reactions   Acyclovir Itching and Nausea Only   Dust Mite Extract Anaphylaxis, Hives, Shortness Of Breath, Itching, Swelling, Anxiety, Dermatitis and Rash   Molds & Smuts Anaphylaxis, Hives, Shortness Of Breath, Itching, Nausea Only, Swelling, Anxiety and Rash   Other Hives and Other (See Comments)    Dust, mold, powder   Prednisone Other (See Comments)    Hyperglycemia    Venlafaxine Shortness Of Breath and Other (See Comments)    Chest discomfort, too   Cephalexin  Nausea Only and Other (See Comments)    Tolerates via IV - not pills   Dulaglutide Nausea And Vomiting and Other (See Comments)    TRULICITY   Latex Dermatitis, Rash and Other (See Comments)    Powder only   Levofloxacin Nausea And Vomiting and Other (See Comments)    Can receive only via IV   Metformin Nausea And Vomiting   Semaglutide Other (See Comments)    Made her really sick (Ozempic or Wegovy)   Sulfa Antibiotics Nausea Only and Other (See Comments)    Stomach upset   Codeine Other (See Comments)  Unknown reaction   Famotidine Other (See Comments)    Reaction not cited    Medications: I have reviewed the patient's current medications.  ROS: Patient reports fevers at home Vision: No changes in vision ENT: No difficulty swallowing CV: No chest pain Pulm: No SOB or wheezing GI: No nausea or vomiting GU: No urgency or inability to hold urine Skin: Prior left foot infection, multiple areas of dermatitis on her upper extremities Neurologic history of peripheral neuropathy    Exam: Blood pressure (!) 91/42, pulse 82, temperature 98.5 F (36.9 C), temperature source Oral, resp. rate (!) 9, height 5' (1.524 m), weight 54.8 kg, SpO2 92%. General: Patient is resting comfortably in bed Orientation: Alert and oriented Mood and Affect: Mood is calm although she does have a slightly slowed and blunted affect   Injured  Extremity (CV, lymph, sensation, reflexes): Examination of the right foot reveals a large necrotic eschar over the dorsum and lateral aspect of the foot this extends up into the midfoot on the lateral aspect.  On the medial side of her right foot that she is having sensation to deep touch but is diminished from normal.  She is able to move her great toe.  She does have capillary refill at the great toe.  She has a palpable dorsal pedal pulse.  No significant cellulitis extending up into the calf or foot although there is changes around the ulceration on the dorsum of the foot.  Examination of the left foot reveals 1/5 ray amputation.  This appears to have healed.  No other areas of infection or ulceration are noted  Upper extremities reveal multiple areas of otitis.  No frank cysts or infections.     Medical Decision Making: Data: Imaging: X-rays of the left foot reveal ulcerations through the lateral forefoot.  There is air seen in the soft tissues.  Bony destruction is seen in the distal 4th and 5th rays  Labs: White cell count this morning is 28.2 down from 34.2 yesterday.  Hemoglobin is 8.1, hematocrit 2 6.5, platelets 462, lactic acid is 2.0, CRP is 34.6, INR 1.2, glucose admission was 529, currently 182, sed rate 136, UA shows rare bacteria, budding yeast is present, glucose is over 500, large leukocyte esterase  Imaging or Labs ordered: Blood cultures are pending  Medical history and chart was reviewed and case discussed with medical provider.  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  This is in the setting of uncontrolled diabetes.  From an orthopedic standpoint, this will likely require surgical debridement.  Without this will be some form and amputation in the mid or hindfoot or require below-knee amputation still to be determined.  At this point I would recommend vascular studies of the  right leg to assess circulation.  Also consider getting an MRI scan to better assess the bony edema within the foot.  She should continue with IV antibiotics.  She should continue to work with medicine to control her blood sugars.  At this point as she is hemodynamically stable and her white cell count is trending down, I would not recommend surgical intervention.  However should because become floridly septic, consideration for emergent amputation may have to be considered.  Hopefully if she improves on antibiotics, we can allow the wound to demarcate, allow time for the vascular and other studies and anticipate a surgical intervention early next week.  The meantime I will closely  follow the patient.  New problem w/ workup planned: High complexity diagnosis (Level 5) Surgery w/ risks or Emergency surgery: High complexity Risk (Level 5)  All others are Level 4 with comprehensive musculoskeletal exam.  Cordella Rhein, MD, MS [facility] [phone]

## 2024-10-17 NOTE — Hospital Course (Addendum)
 Phyllis Pittman is a 48 y.o. female with medical history significant for anxiety, history of CVA, depression with anxiety, diabetes mellitus type 2 which is uncontrolled, hypertension, hyperlipidemia GERD, migraines, neuropathy, sciatica as well as other comorbidities who was recently treated for a left foot infection and had a PICC line.  She went to go see her wound care specialist last week and was reportedly found to have a new wound with a fever at that time.  At that time her right foot was swollen and erythematous up to the ankle with congested discolored small toe with multiple open wounds and had a unstable lateral aspect concerning small toe as well as circumferential blistering of the fourth webspace.  On 10/07/2024 she was advised to go to the emergency room for further evaluation and antibiotics and formal debridement.  She did not go to the ER and kept her Vashe wet-to-dry dressings in place and went back to the wound care center on 10/14/2024 where she was again advised to go to the ED given that her foot had developed and necrotic duskiness and dark eschar on both the plantar and dorsal surface 6 as well as an extensive order.     Patient's Wound care physician had found that the wound had progressively gotten worse and she was noted to be lethargic with malaise.  EMS was called during this clinic visit but she refused to go with EMS to the hospital for further evaluation.  Subsequently she left the clinic despite the advice of the wound care physician to go to the ED.  She now presents 2 days later with worsening foul-smelling drainage, pain and development of a facial rash since Sunday.  She continues to complain of pain and feeling ill.  Denies chest pain, shortness of breath but states that she has been having diarrhea and abdominal discomfort and attributes it to her history of IBS.  Also complains of new facial erythema and swelling and thinks it is because she dropped her phone on her face  when laying in bed.  Denies any other concerns or complaints at this time.   **Interim History Diarrhea tested positive for EPEC so we will add p.o. azithromycin.  For 3 days.  Insulin  drip was weaned and she was transitioned to long-acting insulin .  Diet was advanced.  Orthopedic surgery planning on doing surgical intervention sometime later this week but wanted vascular studies first and MRI imaging.  Will transfer the patient to [facility] and have vascular surgery evaluate the patient in case any intervention is needed prior to likely surgical form of amputation in the mid or hindfoot or require BKA.  Assessment and Plan:  Severe Sepsis 2/2 Right Foot Cellulitis and Osteomyelitis w/ Concern for Necrotizing Fasciitis and Gas Gangrene: Status post 2 L boluses and placed on maintenance IV fluid hydration and will give another 1 L bolus now.  Blood pressure has been borderline low.  Admitted to the stepdown unit feel that she may have a disseminated infection so checking blood cultures x 2 and is are pending.   -Foot x-ray done and showed:  Soft tissue ulceration involving the lateral forefoot, with extensive soft tissue gas throughout the lateral forefoot and midfoot. Findings are consistent with cellulitis and infection with gas-forming organism. Areas of bony destruction involving the fifth digit phalanges, head of the fifth metatarsal, and head of the fourth metatarsal, compatible with osteomyelitis and possible septic arthritis.  No acute fracture.  -Checking CT cervical spine and CT maxillofacial with contrast  and there is below.   -Checked echocardiogram and was negative for any valvular vegetations.  Follow cultures.   -Check urinalysis and urine culture.  Chest x-ray was negative.  IV fluid hydration has now stopped -Discussed with infectious disease and they are recommending IV antibiotic coverage with vancomycin, linezolid and Unasyn.  I am concerned given her extent of infection and her  LRINEC score is 11 which is very concerning for necrotizing fasciitis -Given her significant lower extremity wounds and ulcers will checking vascular despite her having a DP. I spoke with Dr. Pearline of the vascular surgery team and given her likely surgical amputation and intervention will transfer to [facility] for further vascular evaluation in case any intervention is needed prior to surgery; currently her vascular ABI with and without TBI as well as ultrasound lower extremity arterial duplex is still pending; -Orthopedic surgery consulted and evaluated Dr. Reyne recommending obtaining MRI of the foot this has been ordered and pending; currently they do not recommend surgical intervention at this time but feel that they are can allow the wound to demarcate and allow time for the vascular studies and vascular evaluation to be done anticipation for surgical intervention early next week as she will require some form of amputation in the mid or hindfoot or require BKA and this is still to be determined -Check procalcitonin of 0.60 and trend lactic acid level; was 2.0 on admission and improved to 1.2. WBC was 34.2 and has been having intermittent fevers w/ Fever being 101 at home -WBC Trend:  Recent Labs  Lab 10/16/24 1632 10/16/24 2112 10/17/24 0723  WBC 34.2* 32.2* 28.1*  -ESR is 137 and CRP is 34.6 -ID to evaluate and will see the patient in the a.m.  Uncontrolled Diabetes Mellitus Type 2 with significant hyperglycemia: Not acidotic on admission but will initiate insulin  drip and bolus IV fluids as above.  Check a urinalysis and a beta-hydroxybutyrate acid.  Transitioned off of insulin  drip since blood sugars are better controlled.  Hemoglobin A1c still pending.  Given that she is not going for intervention at this time will place on a carb modified diet.  Added long-acting and a moderate NovoLog  sliding scale insulin  every 4  Facial Erythema and Swelling/ Malar Rash: Checked Maxillofacial CT and  showed Subcutaneous fat stranding throughout much of the facial soft tissues, worst in the right lower face, consistent with facial cellulitis.  No abscess or drainable fluid collection. No clear infectious source identified. -Will consider Autoimmune testing if her cellulitis is not improving with antibiotics   Elevated Anion Gap: Ordered for a VBG but this was never obtained.  Her CO2 is now 26, anion gap is improved and resolved at 9 and a chloride level is now 97.  Culture monitor and trend and repeat CMP in a.m.  Diarrhea / Hx of IBS: Check for infectious diarrhea etiology with C. difficile and GI pathogen panel.  GI pathogen is positive for EPEC.  Will start p.o. azithromycin.  C. difficile still pending.  ID has been consulted.  She now has a Flexi-Seal   Pseudohyponatremia -> Hyponatremia: Na+ is now 132.  Glucose is now 188.  Now she is off the insulin  drip   Hx of CVA: Hold ASA for now in case she goes for Surgical Intervention   Essential HTN: Not on any Antihypertensives at home and currently BP is soft.  Added midodrine overnight.  Continue IV fluid and give another bolus.  Continue monitor blood pressure per protocol.  Last blood pressure reading was still on the softer side 101/63   Hypercalcemia: Improved.  Patient's calcium level is now 8.1 with a corrected calcium level of 9.7 for albumin of 2.  Hypophosphatemia: Phos level is now 1.5 and will replete with IV K-Phos 30 mmol.  Continue monitor and replete as necessary  Hypomagnesemia: Mag level is now 1.2.  Replete with IV mag sulfate 4 g.  Can to monitor replete as necessary.  Repeat magnesium level in a.m.   Neck Pain: Check Neck CT w Contrast to evaluate and it showed Mild subcutaneous fat stranding within the posterior right neck without abscess or fluid collection.Anterior neck not evaluated on this study due to exclusion from the field of view.  No acute abnormality of the cervical spine. -C/w Pain Control   Depression  and anxiety and mood disorder: Continue with buspirone  10 mg p.o. daily, bupropion  300 p.o. nightly, quetiapine  50 mg p.o. daily; Resume Alprazolam    Microcytic Anemia: Hgb/Hct is now trending down and went from 10.9/35.9 and is now 8.1/26.5 with an MCV of 79.6 check Anemia Panel in the AM. CTM for S/Sx of Bleeding; No overt bleeding noted. Repeat CBC in the AM   Multiple Wounds/Sacral Wound: WOC Nurse Consult and appreciate the recommendations.  Given her incontinence and her multiple diarrheal episodes will place a Foley catheter to prevent swelling as well as use of Flexi-Seal for the diarrhea that she is having.   History of POTS: IV fluid hydration and monitoring of blood pressure carefully.  Given her leg wounds unable to use compression stockings.  Continue with antidepressants as above; recommending avoiding caffeine and alcohol   Hypoalbuminemia: Patient's Albumin Lvl is now 2.0. CTM and Trend and repeat CMP in the AM

## 2024-10-17 NOTE — Inpatient Diabetes Management (Signed)
 Inpatient Diabetes Program Recommendations  AACE/ADA: New Consensus Statement on Inpatient Glycemic Control (2015)  Target Ranges:  Prepandial:   less than 140 mg/dL      Peak postprandial:   less than 180 mg/dL (1-2 hours)      Critically ill patients:  140 - 180 mg/dL   Lab Results  Component Value Date   GLUCAP 170 (H) 10/17/2024   HGBA1C 12.8 (H) 08/13/2021    Review of Glycemic Control   Latest Reference Range & Units 10/16/24 16:32  Glucose 70 - 99 mg/dL 470 (HH)  Anion gap 5 - 15  17 (H)  Beta-Hydroxybutyric Acid 0.05 - 0.27 mmol/L 3.75 (H)  (HH): Data is critically high (H): Data is abnormally high  Diabetes history: DM2 Outpatient Diabetes medications: Tresiba 16 units every day, Humalog 3-5 units TID Current orders for Inpatient glycemic control: IV insulin -transitioned to Semglee  15 units every day, Novolog  0-9 units Q4H  Noted consult for Diabetes Coordinator. Diabetes Coordinator is not on campus over the weekend but available by pager from 8am to 5pm for questions or concerns. Chart reviewed.   Current A1C is pending.  Will follow.  Thank you, Wyvonna Pinal, MSN, CDCES Diabetes Coordinator Inpatient Diabetes Program [phone] (team pager from 8a-5p)

## 2024-10-17 NOTE — Progress Notes (Signed)
 [facility] night cross cover note:   I ordered a one-time dose of 0.5 mg of iv ativan  to be administered prior to MRI in the setting of pt's anxiety and concern regarding claustrophobia.     Eva Pore, DO Hospitalist

## 2024-10-18 ENCOUNTER — Encounter (HOSPITAL_COMMUNITY): Payer: Self-pay | Admitting: Internal Medicine

## 2024-10-18 ENCOUNTER — Inpatient Hospital Stay (HOSPITAL_COMMUNITY)

## 2024-10-18 DIAGNOSIS — E119 Type 2 diabetes mellitus without complications: Secondary | ICD-10-CM

## 2024-10-18 DIAGNOSIS — K580 Irritable bowel syndrome with diarrhea: Secondary | ICD-10-CM | POA: Diagnosis not present

## 2024-10-18 DIAGNOSIS — M86171 Other acute osteomyelitis, right ankle and foot: Principal | ICD-10-CM

## 2024-10-18 DIAGNOSIS — I96 Gangrene, not elsewhere classified: Secondary | ICD-10-CM

## 2024-10-18 DIAGNOSIS — A414 Sepsis due to anaerobes: Secondary | ICD-10-CM | POA: Diagnosis not present

## 2024-10-18 DIAGNOSIS — L309 Dermatitis, unspecified: Secondary | ICD-10-CM

## 2024-10-18 LAB — URINE CULTURE

## 2024-10-18 LAB — GLUCOSE, CAPILLARY
Glucose-Capillary: 162 mg/dL — ABNORMAL HIGH (ref 70–99)
Glucose-Capillary: 166 mg/dL — ABNORMAL HIGH (ref 70–99)
Glucose-Capillary: 218 mg/dL — ABNORMAL HIGH (ref 70–99)
Glucose-Capillary: 155 mg/dL — ABNORMAL HIGH (ref 70–99)
Glucose-Capillary: 185 mg/dL — ABNORMAL HIGH (ref 70–99)
Glucose-Capillary: 204 mg/dL — ABNORMAL HIGH (ref 70–99)

## 2024-10-18 LAB — C DIFFICILE QUICK SCREEN W PCR REFLEX
C Diff antigen: NEGATIVE
C Diff interpretation: NOT DETECTED
C Diff toxin: NEGATIVE

## 2024-10-18 MED ORDER — NYSTATIN 100000 UNIT/ML MT SUSP
5.0000 mL | Freq: Four times a day (QID) | OROMUCOSAL | Status: DC
  Administered 2024-10-18 – 2024-10-30 (×12): 500000 [IU] via ORAL
  Filled 2024-10-18 (×24): qty 5

## 2024-10-18 MED ORDER — GABAPENTIN 400 MG PO CAPS
800.0000 mg | ORAL_CAPSULE | Freq: Three times a day (TID) | ORAL | Status: DC
  Administered 2024-10-18 – 2024-10-31 (×39): 800 mg via ORAL
  Filled 2024-10-18 (×5): qty 2
  Filled 2024-10-18: qty 8
  Filled 2024-10-18 (×15): qty 2
  Filled 2024-10-18: qty 8
  Filled 2024-10-18 (×18): qty 2

## 2024-10-18 MED ORDER — PANTOPRAZOLE SODIUM 40 MG PO TBEC
40.0000 mg | DELAYED_RELEASE_TABLET | Freq: Every day | ORAL | Status: DC
  Administered 2024-10-18 – 2024-10-31 (×14): 40 mg via ORAL
  Filled 2024-10-18 (×14): qty 1

## 2024-10-18 MED ORDER — ENSURE PLUS HIGH PROTEIN PO LIQD
237.0000 mL | Freq: Two times a day (BID) | ORAL | Status: DC
  Administered 2024-10-19 – 2024-10-31 (×13): 237 mL via ORAL

## 2024-10-18 MED ORDER — TIZANIDINE HCL 4 MG PO TABS
4.0000 mg | ORAL_TABLET | Freq: Every evening | ORAL | Status: DC | PRN
  Administered 2024-10-18 – 2024-10-20 (×2): 4 mg via ORAL
  Filled 2024-10-18 (×2): qty 1

## 2024-10-18 NOTE — Consult Note (Signed)
 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  #Diarrhea in the setting of reported IBS - GIP + epec, patient having several bowel movements - Azithromycin 500 mg x 3 days  #Diabetes mellitus - A1c 11.7 on 08/12/24 - Management per primary  Recommendations:  -Unasyn, linezolid and  vancomycin -f/u surgical plan -Follow blood Cx   #Dermatitis with possible overlying cellulitis -Pt has a scaling rash on left side of her face, knuckles. Follows with derm at [place] for dermatitis. She states she gets these rashes on and ofg. In addtion distribution of rash is not c.w calssical scabies -Of note CT max facial noted subq fat stranding , worst in right lower face . Rash is on left upper facial region Microbiology:   Antibiotics: Unasyn  10/31 Linezolid 10/31- vanc 1/31- + pip-taz   Cultures: Blood 10/31 Urine 10/31 Other   HPI: Phyllis Pittman is a 47 y.o. female  with past medical history of anxiety/depression, CVA, diabetes uncontrolled, hypertension, hyperlipidemia, GERD, neuropathy, sciatica recently treated for left foot infection. Followed by plastic surgery noted to have  necrotic foot on 10/29 and was told to go to the ED on arrival patient afebrile WBC 32K.  Started on vancomycin and pip-tazo.  ID engaged and transition to Unasyn, linezolid and vancomycin.  Recommended MRI.  Review of Systems: Review of Systems  All other systems reviewed and are negative.   Past Medical History:  Diagnosis Date   Anxiety    Cancer (HCC)    neoplasm of cervix   CVA (cerebral vascular accident) (HCC)    hx of   Depression with anxiety    Diabetes mellitus without complication (HCC)    type 2   Female hirsutism    Fibromyalgia    GERD without esophagitis    Hyperlipidemia    Hypertension    IBS (irritable bowel syndrome)    Long term (current) use of insulin  (HCC)    Migraine    Myopia of both eyes    Neuropathy    feet   Neuropathy    OSA (obstructive sleep apnea)    Sciatica    Shingles     Social History   Tobacco Use   Smoking status: Every Day    Current packs/day: 0.25    Types: Cigarettes   Smokeless tobacco: Never   Tobacco comments:    5 or less a day  Vaping Use   Vaping status: Never Used  Substance Use Topics   Alcohol use: Yes    Comment: occ   Drug use: No    Family History  Problem Relation Age of Onset   COPD Mother  Diabetes Mother    Cancer Mother    Diabetes Father    Cancer Father    Scheduled Meds:  azithromycin  500 mg Oral Daily   Chlorhexidine Gluconate Cloth  6 each Topical Daily   collagenase   Topical Daily   enoxaparin  (LOVENOX ) injection  40 mg Subcutaneous Q24H   hydrOXYzine   50 mg Oral BID   insulin  aspart  0-9 Units Subcutaneous Q4H   insulin  glargine-yfgn  15 Units Subcutaneous Daily   midodrine  2.5 mg Oral TID WC   nystatin  5 mL Oral QID   Continuous Infusions:  ampicillin-sulbactam (UNASYN) IV 3 g (10/18/24 0123)   linezolid (ZYVOX) IV 600 mg (10/18/24 9367)   vancomycin 1,250 mg (10/17/24 2346)   PRN Meds:.acetaminophen  **OR** acetaminophen , albuterol, ALPRAZolam , amitriptyline ,  bisacodyl, dextrose, fentaNYL (SUBLIMAZE) injection, hydrALAZINE, ondansetron  **OR** ondansetron  (ZOFRAN ) IV, mouth rinse, oxyCODONE, polyethylene glycol Allergies  Allergen Reactions   Acyclovir Itching and Nausea Only   Dust Mite Extract Anaphylaxis, Hives, Shortness Of Breath, Itching, Swelling, Anxiety, Dermatitis and Rash   Molds & Smuts Anaphylaxis, Hives, Shortness Of Breath, Itching, Nausea Only, Swelling, Anxiety and Rash   Other Hives and Other (See Comments)    Dust, mold, powder   Prednisone Other (See Comments)    Hyperglycemia    Venlafaxine Shortness Of Breath and Other (See Comments)    Chest discomfort, too   Cephalexin  Nausea Only and Other (See Comments)    Tolerates via IV - not pills   Dulaglutide Nausea And Vomiting and Other (See Comments)    TRULICITY   Latex Dermatitis, Rash and Other (See Comments)    Powder only   Levofloxacin Nausea And Vomiting and Other (See Comments)    Can receive only via IV   Metformin Nausea And Vomiting   Semaglutide Other (See Comments)    Made her really sick (Ozempic or Wegovy)   Sulfa Antibiotics Nausea Only and Other (See Comments)    Stomach upset   Codeine Other (See Comments)    Unknown reaction   Famotidine Other (See Comments)    Reaction not cited    OBJECTIVE: Blood pressure 124/78, pulse 90, temperature 98.1 F (36.7 C), temperature source Axillary, resp. rate 13, height 5' (1.524 m), weight 63.5 kg, SpO2 93%.  Physical Exam Constitutional:      Appearance: Normal appearance.  HENT:     Head: Normocephalic and atraumatic.     Right Ear: Tympanic membrane normal.     Left Ear: Tympanic membrane normal.     Nose: Nose normal.     Mouth/Throat:     Mouth: Mucous membranes are moist.  Eyes:     Extraocular Movements: Extraocular movements intact.     Conjunctiva/sclera: Conjunctivae normal.     Pupils: Pupils are equal, round, and reactive to light.  Cardiovascular:     Rate and Rhythm: Normal rate  and regular rhythm.     Heart sounds: No murmur heard.    No friction rub. No gallop.  Pulmonary:     Effort: Pulmonary effort is normal.     Breath sounds: Normal breath sounds.  Abdominal:     General: Abdomen is flat.     Palpations: Abdomen is soft.  Skin:    General: Skin is warm and dry.  Neurological:     General: No focal deficit present.     Mental Status: She is alert and oriented to person, place, and time.  Psychiatric:  Mood and Affect: Mood normal.        Lab Results Lab Results  Component Value Date   WBC 28.1 (H) 10/17/2024   HGB 8.1 (L) 10/17/2024   HCT 26.5 (L) 10/17/2024   MCV 79.6 (L) 10/17/2024   PLT 462 (H) 10/17/2024    Lab Results  Component Value Date   CREATININE 0.44 10/17/2024   BUN 8 10/17/2024   NA 132 (L) 10/17/2024   K 3.5 10/17/2024   CL 97 (L) 10/17/2024   CO2 26 10/17/2024    Lab Results  Component Value Date   ALT 23 10/17/2024   AST 77 (H) 10/17/2024   ALKPHOS 218 (H) 10/17/2024   BILITOT 0.3 10/17/2024       Loney Stank, MD [facility] 10/18/2024, 7:19 AM Evaluation of this patient requires complex antimicrobial therapy evaluation and counseling + isolation needs for disease transmission risk assessment and mitigation

## 2024-10-18 NOTE — Progress Notes (Signed)
 Orthopaedic Progress Note  S: The patient is resting comfortably in bed.  She is somewhat poorly communicative but does respond to questions.  She reports that her foot is essentially stable  O:  Vitals:   10/17/24 2354 10/18/24 0510  BP: 126/73 124/78  Pulse: 92 90  Resp: 20 13  Temp: 98.9 F (37.2 C) 98.1 F (36.7 C)  SpO2: 92% 93%    Foul-smelling wound over the dorsum and medial aspect of the right foot.  Bandages in place.  This appears essentially unchanged from yesterday  Imaging: MRI scan images are back but no report yet from the radiologist.  This is done without contrast due to the patient's renal status.  The MRI scan does show erosions of the distal first phalanx and some questionable involvement of the MCP joint.  No apparent involvement of the metatarsals but we will await the final read from the radiologist  Vascular studies are still pending  Labs:  Results for orders placed or performed during the hospital encounter of 10/16/24 (from the past 24 hours)  Basic metabolic panel     Status: Abnormal   Collection Time: 10/17/24  9:20 AM  Result Value Ref Range   Sodium 132 (L) 135 - 145 mmol/L   Potassium 3.5 3.5 - 5.1 mmol/L   Chloride 97 (L) 98 - 111 mmol/L   CO2 26 22 - 32 mmol/L   Glucose, Bld 188 (H) 70 - 99 mg/dL   BUN 8 6 - 20 mg/dL   Creatinine, Ser 9.55 0.44 - 1.00 mg/dL   Calcium 8.1 (L) 8.9 - 10.3 mg/dL   GFR, Estimated >39 >39 mL/min   Anion gap 9 5 - 15  Beta-hydroxybutyric acid     Status: Abnormal   Collection Time: 10/17/24  9:20 AM  Result Value Ref Range   Beta-Hydroxybutyric Acid 0.92 (H) 0.05 - 0.27 mmol/L  Magnesium     Status: Abnormal   Collection Time: 10/17/24  9:20 AM  Result Value Ref Range   Magnesium 1.2 (L) 1.7 - 2.4 mg/dL  Phosphorus     Status: Abnormal   Collection Time: 10/17/24  9:20 AM  Result Value Ref Range   Phosphorus 1.5 (L) 2.5 - 4.6 mg/dL  Lactic acid, plasma     Status: None   Collection Time: 10/17/24  9:20 AM   Result Value Ref Range   Lactic Acid, Venous 1.5 0.5 - 1.9 mmol/L  Urinalysis, w/ Reflex to Culture (Infection Suspected) -Urine, Catheterized     Status: Abnormal   Collection Time: 10/17/24 11:08 AM  Result Value Ref Range   Specimen Source URINE, CATHETERIZED    Color, Urine YELLOW YELLOW   APPearance HAZY (A) CLEAR   Specific Gravity, Urine 1.027 1.005 - 1.030   pH 5.0 5.0 - 8.0   Glucose, UA 150 (A) NEGATIVE mg/dL   Hgb urine dipstick NEGATIVE NEGATIVE   Bilirubin Urine NEGATIVE NEGATIVE   Ketones, ur NEGATIVE NEGATIVE mg/dL   Protein, ur 30 (A) NEGATIVE mg/dL   Nitrite NEGATIVE NEGATIVE   Leukocytes,Ua NEGATIVE NEGATIVE   RBC / HPF 0-5 0 - 5 RBC/hpf   WBC, UA 0-5 0 - 5 WBC/hpf   Bacteria, UA NONE SEEN NONE SEEN   Squamous Epithelial / HPF 0-5 0 - 5 /HPF   Budding Yeast PRESENT   Glucose, capillary     Status: Abnormal   Collection Time: 10/17/24 11:11 AM  Result Value Ref Range   Glucose-Capillary 170 (H) 70 - 99 mg/dL  Lactic acid, plasma     Status: None   Collection Time: 10/17/24  1:21 PM  Result Value Ref Range   Lactic Acid, Venous 1.2 0.5 - 1.9 mmol/L  Glucose, capillary     Status: Abnormal   Collection Time: 10/17/24  2:52 PM  Result Value Ref Range   Glucose-Capillary 149 (H) 70 - 99 mg/dL  Glucose, capillary     Status: Abnormal   Collection Time: 10/17/24 11:20 PM  Result Value Ref Range   Glucose-Capillary 174 (H) 70 - 99 mg/dL  Glucose, capillary     Status: Abnormal   Collection Time: 10/18/24  5:07 AM  Result Value Ref Range   Glucose-Capillary 162 (H) 70 - 99 mg/dL    Assessment: 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  Her white cell count was trending downward yesterday but there is no new white cell count from today.  The labs ordered but did not appear yet.  The MRI scan shows primarily osteo myelitis of the first distal phalanx.  The ulceration however is somewhat  more involved going up into the midfoot.  Again this would likely require surgical debridement in the future but the exact nature of debridement will be treatment based on the vascular studies.  I will asked to see if Dr. Harden would be interested in getting involved to help manage this.  In the meantime we will continue with IV antibiotics, observe the patient's white cell count to make sure this is trending downward and observe her clinically.  She will continue to work with medicine to control her diabetes     Cordella Rhein, MD, MS [facility] [phone]

## 2024-10-18 NOTE — Consult Note (Addendum)
 Hospital Consult    Reason for Consult:  right foot wound  MRN #:  [id number]  History of Present Illness: This is a 48 y.o. female with a history of uncontrolled type 2 diabetes, HTN, HLD and previous left foot wounds resulting in toe amputations.  She was seen at an outpatient center on 10/22 and was advised to go to the emergency room for further evaluation, antibiotics and likely debridement.  She did not present to the ER until yesterday.  She has a necrotic dorsal foot wound that tracks onto the 4th and 5th toes.  When she was admitted she was noted to be lethargic and had a white count in the 30s.  She does have pain in purulent drainage from the foot.  Past Medical History:  Diagnosis Date   Anxiety    Cancer (HCC)    neoplasm of cervix   CVA (cerebral vascular accident) (HCC)    hx of   Depression with anxiety    Diabetes mellitus without complication (HCC)    type 2   Female hirsutism    Fibromyalgia    GERD without esophagitis    Hyperlipidemia    Hypertension    IBS (irritable bowel syndrome)    Long term (current) use of insulin  (HCC)    Migraine    Myopia of both eyes    Neuropathy    feet   Neuropathy    OSA (obstructive sleep apnea)    Sciatica    Shingles     Past Surgical History:  Procedure Laterality Date   CESAREAN SECTION     05/2001, 07/2012   CHOLECYSTECTOMY  10/1999   FACIAL COSMETIC SURGERY     FOOT SURGERY      Allergies  Allergen Reactions   Acyclovir Itching and Nausea Only   Dust Mite Extract Anaphylaxis, Hives, Shortness Of Breath, Itching, Swelling, Anxiety, Dermatitis and Rash   Molds & Smuts Anaphylaxis, Hives, Shortness Of Breath, Itching, Nausea Only, Swelling, Anxiety and Rash   Other Hives and Other (See Comments)    Dust, mold, powder   Prednisone Other (See Comments)    Hyperglycemia    Venlafaxine Shortness Of Breath and Other (See Comments)    Chest discomfort, too   Cephalexin  Nausea Only and Other (See Comments)     Tolerates via IV - not pills   Dulaglutide Nausea And Vomiting and Other (See Comments)    TRULICITY   Latex Dermatitis, Rash and Other (See Comments)    Powder only   Levofloxacin Nausea And Vomiting and Other (See Comments)    Can receive only via IV   Metformin Nausea And Vomiting   Semaglutide Other (See Comments)    Made her really sick (Ozempic or Wegovy)   Sulfa Antibiotics Nausea Only and Other (See Comments)    Stomach upset   Codeine Other (See Comments)    Unknown reaction   Famotidine Other (See Comments)    Reaction not cited    Prior to Admission medications   Medication Sig Start Date End Date Taking? Authorizing Provider  cetirizine (ZYRTEC) 10 MG tablet Take 10 mg by mouth daily.   Yes [provider]  gabapentin (NEURONTIN) 800 MG tablet Take 800 mg by mouth in the morning, at noon, and at bedtime.   Yes [provider]  hydrOXYzine  (ATARAX ) 50 MG tablet Take 50 mg by mouth in the morning and at bedtime.   Yes [provider]  insulin  lispro (HUMALOG) 100 UNIT/ML injection Inject  3-5 Units into the skin 3 (three) times daily with meals.   Yes [provider]  ketoconazole (NIZORAL) 2 % cream Apply 1 Application topically See admin instructions. Apply to affected area 2 times a day   Yes [provider]  loperamide (IMODIUM) 2 MG capsule Take 2 mg by mouth at bedtime as needed for diarrhea or loose stools.   Yes [provider]  naproxen sodium (ALEVE) 220 MG tablet Take 440 mg by mouth at bedtime.   Yes [provider]  omeprazole (PRILOSEC) 40 MG capsule Take 40 mg by mouth at bedtime.   Yes [provider]  ondansetron  (ZOFRAN -ODT) 4 MG disintegrating tablet Take 4 mg by mouth every 6 (six) hours as needed for nausea or vomiting (dissolve orally).   Yes [provider]  oxyCODONE-acetaminophen  (PERCOCET) 10-325 MG tablet Take 1 tablet by mouth 4 (four) times daily.   Yes [provider]  rOPINIRole (REQUIP) 0.25 MG tablet Take 0.25 mg by mouth at bedtime.   Yes [provider]  tiZANidine (ZANAFLEX) 4 MG tablet Take 4 mg by mouth at bedtime.   Yes [provider]  traZODone (DESYREL) 50 MG tablet Take 50-100 mg by mouth at bedtime.   Yes [provider]  TRESIBA FLEXTOUCH 100 UNIT/ML FlexTouch Pen Inject 16 Units into the skin at bedtime.   Yes [provider]  ALPRAZolam  (XANAX ) 0.5 MG tablet Take 1 tablet (0.5 mg total) by mouth at bedtime as needed for anxiety. Patient not taking: Reported on 10/16/2024 07/13/21   Patt Alm Macho, MD  cyclobenzaprine  (FLEXERIL ) 5 MG tablet Take 1 tablet (5 mg total) by mouth 3 (three) times daily as needed for muscle spasms. Patient not taking: Reported on 10/16/2024 07/13/21   Patt Alm Macho, MD  Insulin  Glargine (BASAGLAR  [facility]) 100 UNIT/ML Inject 30 Units into the skin at bedtime. Patient not taking: Reported on 10/16/2024 08/14/21 10/16/24  Rojelio Nest, DO  ondansetron  (ZOFRAN  ODT) 8 MG disintegrating tablet Take 1 tablet (8 mg total) by mouth every 8 (eight) hours as needed for nausea or vomiting. Patient not taking: Reported on 10/16/2024 02/22/18   Kirichenko, Tatyana, PA-C    Social History   Socioeconomic History   Marital status: Significant Other    Spouse name: Not on file   Number of children: 3   Years of education: Not on file   Highest education level: Not on file  Occupational History    Comment: home maker  Tobacco Use   Smoking status: Every Day    Current packs/day: 0.25    Types: Cigarettes   Smokeless tobacco: Never   Tobacco comments:    5 or less a day  Vaping Use   Vaping status: Never Used  Substance and Sexual Activity   Alcohol use: Yes    Comment: occ   Drug use: No   Sexual activity: Not on file  Other Topics Concern   Not on file  Social History Narrative   Lives with partner   Social Drivers of Health   Financial Resource  Strain: Not on file  Food Insecurity: Patient Declined (10/18/2024)   Hunger Vital Sign    Worried About Running Out of Food in the Last Year: Patient declined    Ran Out of Food in the Last Year: Patient declined  Transportation Needs: Unknown (10/18/2024)   PRAPARE - Administrator, Civil Service (Medical): Patient declined    Lack of Transportation (Non-Medical): No  Physical Activity: Not on file  Stress: Not on file  Social Connections: Unknown (04/20/2022)   Received from [facility]   Social Network    Social Network: Not on file  Intimate Partner Violence: Patient Declined (10/18/2024)   Humiliation, Afraid, Rape, and Kick questionnaire    Fear of Current or Ex-Partner: Patient declined    Emotionally Abused: Patient declined    Physically Abused: Patient declined    Sexually Abused: Patient declined    Family History  Problem Relation Age of Onset   COPD Mother    Diabetes Mother    Cancer Mother    Diabetes Father    Cancer Father     ROS: Otherwise negative unless mentioned in HPI  Physical Examination  Vitals:   10/18/24 0510 10/18/24 0802  BP: 124/78 130/80  Pulse: 90 88  Resp: 13 18  Temp: 98.1 F (36.7 C) 99.2 F (37.3 C)  SpO2: 93% 96%   Body mass index is 27.34 kg/m.  General: no acute distress Cardiac: hemodynamically stable Neuro: minimally interactive, no focal deficit Extremities: Necrotic right foot wound      Data:   Vascular studies pending    ASSESSMENT/PLAN: This is a 48 y.o. female with uncontrolled diabetes left fifth toe amputation with midfoot debridements now with a necrotic and infected right foot wound.  There is necrotic tissue throughout the dorsum of the foot spreading onto the 4th and 5th toes and somewhat on the third toe.  At this point I do not not think that the foot is salvageable and regardless of vascular studies is likely to require major amputation.   Norman GORMAN Serve MD [facility] [phone] 10/18/2024  8:25 AM

## 2024-10-18 NOTE — Progress Notes (Addendum)
 [facility] night cross cover note:   I was notified by the patient's RN at the patient's request resumption of her home omeprazole as well as her home gabapentin, noting that she has been experiencing worsening peripheral polyneuropathy over the last day as her gabapentin was initially held in the setting of initial hypotension, which appears to have improved, with systolic blood pressures in the low 100's to 130s throughout today, and with RN conveying most recent BP of 109/70 mmHg. She also request resumption of her home evening scheduled Tizanidine.   I subsequently resumed her home gabapentin, and ordered our formulary equivalent of her home omeprazole.  I also resumed her home evening tizanidine, but made it prn for muscle spasms. Will continue to monitor ensuing blood pressure.      Eva Pore, DO Hospitalist

## 2024-10-18 NOTE — Consult Note (Addendum)
 WOC team consulted for sacral wound and R foot. Please see original consult note 10/31 addressing sacral wound and L foot (chronic) wound.  R foot is necrotic and being managed by orthopedics with no  immediate plans for surgical intervention. Will place topical orders for R foot as well for bedside nurse to perform daily.    Cleanse R foot with Vashe wound cleanser, apply Xeroform gauze ([place] [phone]) to entire wound daily and secure with Kerlix roll gauze.    Secure chat sent to primary MD and bedside nurse regarding above.  Please reconsult as needed.   Thank you,    Powell Bar MSN, RN-BC, [employer]

## 2024-10-18 NOTE — Progress Notes (Signed)
 [facility] night cross cover note:   Pt's tongue with white appearance concerning for thrush. I subsequently ordered nystatin swish and swallow.     Eva Pore, DO Hospitalist

## 2024-10-18 NOTE — Progress Notes (Addendum)
 PROGRESS NOTE    Phyllis Pittman  FMW:[id number] DOB: 08/30/1976 DOA: 10/16/2024 PCP: [name], PA  Chief Complaint  Patient presents with   Wound Infection   Facial Swelling    Brief Narrative:   48 y.o. female with medical history significant for anxiety, history of CVA, depression with anxiety, diabetes mellitus type 2 which is uncontrolled, hypertension, hyperlipidemia GERD, migraines, neuropathy, sciatica as well as other comorbidities who was recently treated for Phyllis Pittman left foot infection and had Phyllis Pittman PICC line.  She went to go see her wound care specialist last week and was reportedly found to have Phyllis Pittman new wound with Phyllis Pittman fever at that time.  At that time her right foot was swollen and erythematous up to the ankle with congested discolored small toe with multiple open wounds and had Phyllis Pittman unstable lateral aspect concerning small toe as well as circumferential blistering of the fourth webspace.  On 10/07/2024 she was advised to go to the emergency room for further evaluation and antibiotics and formal debridement.  She did not go to the ER and kept her Vashe wet-to-dry dressings in place and went back to the wound care center on 10/14/2024 where she was again advised to go to the ED given that her foot had developed and necrotic duskiness and dark eschar on both the plantar and dorsal surface 6 as well as an extensive order.     Patient's Wound care physician had found that the wound had progressively gotten worse and she was noted to be lethargic with malaise.  EMS was called during this clinic visit but she refused to go with EMS to the hospital for further evaluation.  Subsequently she left the clinic despite the advice of the wound care physician to go to the ED.  She now presents 2 days later with worsening foul-smelling drainage, pain and development of Phyllis Pittman facial rash since Sunday.  She continues to complain of pain and feeling ill.  Denies chest pain, shortness of breath but states that she has  been having diarrhea and abdominal discomfort and attributes it to her history of IBS.  Also complains of new facial erythema and swelling and thinks it is because she dropped her phone on her face when laying in bed.  Denies any other concerns or complaints at this time.  Assessment & Plan:   Principal Problem:   Osteomyelitis (HCC)  Sepsis Diabetic Foot Infection  Cellulitis and Possible Septic Arthritis Rules in with fever (last 11/1 PM), leukocytosis, diabetic foot infection  Plain films with findings consistent with cellulitis and infection with gas forming organism on R foot.  Osteomyelitis and possible septic arthritis. MRI currently pending.  Abx with vanc, unasyn, and linzolid per previous provider's with ID Palpable PT pulse.  ABI pending.  Vascular does not think the foot is salvageable and likely to require major amputation regardless of vascular studies. Ortho c/s, appreciate recs - awaiting vascular studies - reaching out to Phyllis Pittman Blood culture x1 (10/31) NGTD Sed rate 137, CRP 34.6 Urine cx multiple species.  Negative MRSA PCR.  Uncontrolled T2DM Repeat A1c Basal, SSI - adjust as needed  Hypotension  Currently on midodrine - SBP in 80's yesterday during the day Will add holding parameters for midodrine, BP much improved  Anemia Pending labs today  Dermatitis of unclear cause Excoriations, erythema, and scaling noted to dorsal aspect of hands as well as her face CT max face with findings c/w facial cellulitis  Given interdigital rash, excoriations, scabies should be  considered - crusted scabies? Though not classic in appearance.  Will discuss with ID.    Mild Subcutaneous fat stranding within posterior R neck Will monitor   History of POTS Noted   Diarrhea GI path panel with EPEC Azithro   Hyponatremia Labs pending today  Hypophosphatemia  Hypomagnesemia  Hypercalcemia Labs pending today  Elevated Anion Gap Resolved, labs pending  today  Hypoalbuminemia In setting of her acute infection Follow  Decubitus Wounds Appreciate wound care c/s  Uses walker at baseline.  Drives.  Cooks, dresses herself.  Per discussion with family, doesn't do Truly Stankiewicz great job taking care of her health.      DVT prophylaxis: lovenox  Code Status: full Family Communication: none Disposition:   Status is: Inpatient Remains inpatient appropriate because: need for continued inpatient care   Consultants:  Vascular ID ortho  Procedures:  Echo IMPRESSIONS     1. Left ventricular ejection fraction, by estimation, is 60 to 65%. The  left ventricle has normal function. The left ventricle has no regional  wall motion abnormalities. Left ventricular diastolic parameters were  normal.   2. Right ventricular systolic function is normal. The right ventricular  size is normal. There is normal pulmonary artery systolic pressure.   3. The mitral valve is normal in structure. No evidence of mitral valve  regurgitation. No evidence of mitral stenosis.   4. The aortic valve has an indeterminant number of cusps. Aortic valve  regurgitation is not visualized. No aortic stenosis is present.   5. The inferior vena cava is dilated in size with >50% respiratory  variability, suggesting right atrial pressure of 8 mmHg.   Comparison(s): No prior Echocardiogram.   Conclusion(s)/Recommendation(s): No evidence of valvular vegetations on  this transthoracic echocardiogram. Consider Jandel Patriarca transesophageal  echocardiogram to exclude infective endocarditis if clinically indicated.   Antimicrobials:  Anti-infectives (From admission, onward)    Start     Dose/Rate Route Frequency Ordered Stop   10/17/24 1945  Vancomycin (VANCOCIN) 1,250 mg in sodium chloride  0.9 % 250 mL IVPB  Status:  Discontinued       Placed in Followed by Linked Group   1,250 mg 166.7 mL/hr over 90 Minutes Intravenous Every 24 hours 10/16/24 1845 10/16/24 1847   10/17/24 1945   vancomycin (VANCOREADY) IVPB 1250 mg/250 mL       Placed in Followed by Linked Group   1,250 mg 166.7 mL/hr over 90 Minutes Intravenous Every 24 hours 10/16/24 1848     10/17/24 1800  azithromycin (ZITHROMAX) tablet 500 mg        500 mg Oral Daily 10/17/24 1709 10/20/24 0959   10/17/24 0530  linezolid (ZYVOX) IVPB 600 mg        600 mg 300 mL/hr over 60 Minutes Intravenous Every 12 hours 10/16/24 1752 10/27/24 0529   10/17/24 0130  Ampicillin-Sulbactam (UNASYN) 3 g in sodium chloride  0.9 % 100 mL IVPB        3 g 200 mL/hr over 30 Minutes Intravenous Every 6 hours 10/16/24 1845     10/16/24 2000  meropenem (MERREM) 1 g in sodium chloride  0.9 % 100 mL IVPB  Status:  Discontinued        1 g 200 mL/hr over 30 Minutes Intravenous Every 8 hours 10/16/24 1825 10/16/24 1831   10/16/24 1945  vancomycin (VANCOREADY) IVPB 1500 mg/300 mL       Placed in Followed by Linked Group   1,500 mg 150 mL/hr over 120 Minutes Intravenous  Once 10/16/24 1848 10/16/24  2249   10/16/24 1930  Vancomycin (VANCOCIN) 1,500 mg in sodium chloride  0.9 % 500 mL IVPB  Status:  Discontinued       Placed in Followed by Linked Group   1,500 mg 250 mL/hr over 120 Minutes Intravenous  Once 10/16/24 1845 10/16/24 1847   10/16/24 1800  valACYclovir  (VALTREX ) tablet 500 mg  Status:  Discontinued        500 mg Oral Daily 10/16/24 1752 10/16/24 2206   10/16/24 1800  meropenem (MERREM) 1 g in sodium chloride  0.9 % 100 mL IVPB  Status:  Discontinued        1 g 200 mL/hr over 30 Minutes Intravenous  Once 10/16/24 1752 10/16/24 1817   10/16/24 1600  Vancomycin (VANCOCIN) 1,500 mg in sodium chloride  0.9 % 500 mL IVPB  Status:  Discontinued        1,500 mg 250 mL/hr over 120 Minutes Intravenous  Once 10/16/24 1506 10/16/24 1512   10/16/24 1600  Vancomycin (VANCOCIN) 1,500 mg in sodium chloride  0.9 % 500 mL IVPB  Status:  Discontinued        1,500 mg 250 mL/hr over 120 Minutes Intravenous  Once 10/16/24 1512 10/16/24 1525    10/16/24 1600  linezolid (ZYVOX) IVPB 600 mg        600 mg 300 mL/hr over 60 Minutes Intravenous  Once 10/16/24 1525 10/16/24 1835   10/16/24 1530  piperacillin-tazobactam (ZOSYN) IVPB 3.375 g        3.375 g 100 mL/hr over 30 Minutes Intravenous  Once 10/16/24 1506 10/16/24 1751       Subjective: Feeling generally poorly   Objective: Vitals:   10/17/24 2007 10/17/24 2354 10/18/24 0510 10/18/24 0802  BP: 119/71 126/73 124/78 130/80  Pulse: 86 92 90 88  Resp: 20 20 13 18   Temp: (!) 102.3 F (39.1 C) 98.9 F (37.2 C) 98.1 F (36.7 C) 99.2 F (37.3 C)  TempSrc: Oral Oral Axillary Oral  SpO2: 94% 92% 93% 96%  Weight:      Height:        Intake/Output Summary (Last 24 hours) at 10/18/2024 0842 Last data filed at 10/18/2024 9356 Gross per 24 hour  Intake 1982.28 ml  Output 950 ml  Net 1032.28 ml   Filed Weights   10/16/24 1320 10/16/24 1832 10/17/24 1449  Weight: 65.8 kg 54.8 kg 63.5 kg    Examination:  General exam: Appears calm and comfortable  Erythema and swelling noted to face, excoriations and scale Respiratory system: unlabored Cardiovascular system: RRR Gastrointestinal system: Abdomen is nondistended, soft and nontender Central nervous system: Alert and oriented. No focal neurological deficits. Extremities: RLE with erythema and swelling to foot - eschar from toes to midfoot, purulent exudate and drainage.  Palpable PT pulse.  eschar on sole as well.  No appreciated crepitus.  Interdigital excoriations, erythema, and scale on both hands.     Data Reviewed: I have personally reviewed following labs and imaging studies  CBC: Recent Labs  Lab 10/16/24 1632 10/16/24 2112 10/17/24 0723  WBC 34.2* 32.2* 28.1*  NEUTROABS 29.3*  --   --   HGB 10.9* 9.3* 8.1*  HCT 35.9* 30.1* 26.5*  MCV 78.6* 78.8* 79.6*  PLT 552* 523* 462*    Basic Metabolic Panel: Recent Labs  Lab 10/16/24 1632 10/16/24 2112 10/17/24 0253 10/17/24 0723 10/17/24 0920  NA 126* 130*  133* 130* 132*  K 3.9 3.2* 3.0* 3.2* 3.5  CL 80* 88* 96* 95* 97*  CO2 30 28 28  27 26  GLUCOSE 529* 286* 199* 182* 188*  BUN 20 15 11 9 8   CREATININE 0.79 0.68 0.48 0.50 0.44  CALCIUM 10.5* 9.2 8.3* 8.4* 8.1*  MG  --  1.4*  --   --  1.2*  PHOS  --  1.8*  --   --  1.5*    GFR: Estimated Creatinine Clearance: 72.3 mL/min (by C-G formula based on SCr of 0.44 mg/dL).  Liver Function Tests: Recent Labs  Lab 10/16/24 1632 10/17/24 0723  AST 37 77*  ALT 22 23  ALKPHOS 266* 218*  BILITOT 0.5 0.3  PROT 10.2* 6.8  ALBUMIN 3.1* 2.0*    CBG: Recent Labs  Lab 10/17/24 1111 10/17/24 1452 10/17/24 2320 10/18/24 0507 10/18/24 0832  GLUCAP 170* 149* 174* 162* 218*     Recent Results (from the past 240 hours)  Blood Culture (routine x 2)     Status: None (Preliminary result)   Collection Time: 10/16/24  4:25 PM   Specimen: BLOOD RIGHT ARM  Result Value Ref Range Status   Specimen Description   Final    BLOOD RIGHT ARM Performed at [facility], [address] [place], [place] [address]    Special Requests   Final    BOTTLES DRAWN AEROBIC AND ANAEROBIC Blood Culture adequate volume Performed at [facility], [address] [place], [place] [address]    Culture   Final    NO GROWTH < 12 HOURS Performed at [facility], [address] [place], [place] [address]    Report Status PENDING  Incomplete  MRSA Next Gen by PCR, Nasal     Status: None   Collection Time: 10/16/24  6:51 PM   Specimen: Nasal Mucosa; Nasal Swab  Result Value Ref Range Status   MRSA by PCR Next Gen NOT DETECTED NOT DETECTED Final    Comment: (NOTE) The GeneXpert MRSA Assay (FDA approved for NASAL specimens only), is one component of Kaveh Kissinger comprehensive MRSA colonization surveillance program. It is not intended to diagnose MRSA infection nor to guide or monitor treatment for MRSA infections. Test performance is not FDA approved in patients less than 66  years old. Performed at [facility], [address] [place], [place] [address]   Blood Culture (routine x 2)     Status: None (Preliminary result)   Collection Time: 10/16/24  9:12 PM   Specimen: BLOOD RIGHT HAND  Result Value Ref Range Status   Specimen Description   Final    BLOOD RIGHT HAND Performed at [facility], [address], [place], [place] [address]    Special Requests   Final    BOTTLES DRAWN AEROBIC AND ANAEROBIC Blood Culture results may not be optimal due to an inadequate volume of blood received in culture bottles Performed at [facility], [address], [place], [place] [address]    Culture   Final    NO GROWTH < 12 HOURS Performed at [facility], [address] [place], [place] [address]    Report Status PENDING  Incomplete  Urine Culture (for pregnant, neutropenic or urologic patients or patients with an indwelling urinary catheter)     Status: Abnormal   Collection Time: 10/16/24 11:40 PM   Specimen: Urine, Clean Catch  Result Value Ref Range Status   Specimen Description   Final    URINE, CLEAN CATCH Performed at [facility], [address] [place], [place]  [address]    Special Requests   Final    NONE Performed at [facility], [address], [place], [place] [address]    Culture MULTIPLE SPECIES PRESENT, SUGGEST RECOLLECTION ([employer])  Final   Report Status 10/18/2024 FINAL  Final  Gastrointestinal Panel by PCR , Stool     Status: Abnormal   Collection Time: 10/17/24  6:59 AM   Specimen: Stool  Result Value Ref Range Status   Campylobacter species NOT DETECTED NOT DETECTED Final   Plesimonas shigelloides NOT DETECTED NOT DETECTED Final   Salmonella species NOT DETECTED NOT DETECTED Final   Yersinia enterocolitica NOT DETECTED NOT DETECTED Final   Vibrio species NOT DETECTED NOT DETECTED Final   Vibrio cholerae NOT DETECTED NOT DETECTED Final    Enteroaggregative E coli (EAEC) NOT DETECTED NOT DETECTED Final   Enteropathogenic E coli (EPEC) DETECTED (Kevionna Heffler) NOT DETECTED Final    Comment: RESULT CALLED TO, READ BACK BY AND VERIFIED WITH: MICHELLE B., RN AT 1648 10/17/24 RAM    Enterotoxigenic E coli (ETEC) NOT DETECTED NOT DETECTED Final   Shiga like toxin producing E coli (STEC) NOT DETECTED NOT DETECTED Final   Shigella/Enteroinvasive E coli (EIEC) NOT DETECTED NOT DETECTED Final   Cryptosporidium NOT DETECTED NOT DETECTED Final   Cyclospora cayetanensis NOT DETECTED NOT DETECTED Final   Entamoeba histolytica NOT DETECTED NOT DETECTED Final   Giardia lamblia NOT DETECTED NOT DETECTED Final   Adenovirus F40/41 NOT DETECTED NOT DETECTED Final   Astrovirus NOT DETECTED NOT DETECTED Final   Norovirus GI/GII NOT DETECTED NOT DETECTED Final   Rotavirus Mesha Schamberger NOT DETECTED NOT DETECTED Final   Sapovirus (I, II, IV, and V) NOT DETECTED NOT DETECTED Final    Comment: Performed at [facility], [address] [place], [place] [address]         Radiology Studies: ECHOCARDIOGRAM COMPLETE Result Date: 10/17/2024    ECHOCARDIOGRAM REPORT   Patient Name:   CHARDAE MULKERN Date of Exam: 10/17/2024 Medical Rec #:  [id number]       Height:       60.0 in Accession #:    [id number]      Weight:       120.8 lb Date of Birth:  03-22-1976       BSA:          1.507 m Patient Age:    48 years        BP:           91/42 mmHg Patient Gender: F               HR:           76 bpm. Exam Location:  Inpatient Procedure: 2D Echo, Cardiac Doppler and Color Doppler (Both Spectral and Color            Flow Doppler were utilized during procedure). Indications:    Bacteremia R78.81  History:        Patient has no prior history of Echocardiogram examinations. Hx                 of CVA, Signs/Symptoms:Bacteremia; Risk Factors:Diabetes,                 Hypertension, Dyslipidemia and Sleep Apnea.  Sonographer:    Koleen Popper RDCS Referring Phys: [id number] ALEJANDRO LATIF  [facility] IMPRESSIONS  1. Left ventricular ejection fraction, by estimation, is 60 to 65%. The left ventricle has normal function. The left ventricle has no  regional wall motion abnormalities. Left ventricular diastolic parameters were normal.  2. Right ventricular systolic function is normal. The right ventricular size is normal. There is normal pulmonary artery systolic pressure.  3. The mitral valve is normal in structure. No evidence of mitral valve regurgitation. No evidence of mitral stenosis.  4. The aortic valve has an indeterminant number of cusps. Aortic valve regurgitation is not visualized. No aortic stenosis is present.  5. The inferior vena cava is dilated in size with >50% respiratory variability, suggesting right atrial pressure of 8 mmHg. Comparison(s): No prior Echocardiogram. Conclusion(s)/Recommendation(s): No evidence of valvular vegetations on this transthoracic echocardiogram. Consider Ramsay Bognar transesophageal echocardiogram to exclude infective endocarditis if clinically indicated. FINDINGS  Left Ventricle: Left ventricular ejection fraction, by estimation, is 60 to 65%. The left ventricle has normal function. The left ventricle has no regional wall motion abnormalities. Strain was performed and the global longitudinal strain is indeterminate. The left ventricular internal cavity size was normal in size. There is no left ventricular hypertrophy. Left ventricular diastolic parameters were normal. Right Ventricle: The right ventricular size is normal. No increase in right ventricular wall thickness. Right ventricular systolic function is normal. There is normal pulmonary artery systolic pressure. The tricuspid regurgitant velocity is 2.46 m/s, and  with an assumed right atrial pressure of 8 mmHg, the estimated right ventricular systolic pressure is 32.2 mmHg. Left Atrium: Left atrial size was normal in size. Right Atrium: Right atrial size was normal in size. Pericardium: There is no evidence of  pericardial effusion. Mitral Valve: The mitral valve is normal in structure. No evidence of mitral valve regurgitation. No evidence of mitral valve stenosis. Tricuspid Valve: The tricuspid valve is normal in structure. Tricuspid valve regurgitation is trivial. No evidence of tricuspid stenosis. Aortic Valve: The aortic valve has an indeterminant number of cusps. Aortic valve regurgitation is not visualized. No aortic stenosis is present. Pulmonic Valve: The pulmonic valve was grossly normal. Pulmonic valve regurgitation is not visualized. No evidence of pulmonic stenosis. Aorta: The aortic root and ascending aorta are structurally normal, with no evidence of dilitation. Venous: The inferior vena cava is dilated in size with greater than 50% respiratory variability, suggesting right atrial pressure of 8 mmHg. IAS/Shunts: No atrial level shunt detected by color flow Doppler. Additional Comments: 3D was performed not requiring image post processing on an independent workstation and was indeterminate.  LEFT VENTRICLE PLAX 2D LVIDd:         4.50 cm   Diastology LVIDs:         2.80 cm   LV e' medial:    11.10 cm/s LV PW:         0.70 cm   LV E/e' medial:  11.2 LV IVS:        0.90 cm   LV e' lateral:   12.70 cm/s LVOT diam:     1.80 cm   LV E/e' lateral: 9.8 LV SV:         53 LV SV Index:   35 LVOT Area:     2.54 cm  RIGHT VENTRICLE             IVC RV Basal diam:  3.40 cm     IVC diam: 2.50 cm RV S prime:     12.80 cm/s TAPSE (M-mode): 3.2 cm LEFT ATRIUM             Index        RIGHT ATRIUM  Index LA diam:        3.10 cm 2.06 cm/m   RA Area:     8.54 cm LA Vol (A2C):   23.1 ml 15.33 ml/m  RA Volume:   14.00 ml 9.29 ml/m LA Vol (A4C):   27.3 ml 18.12 ml/m LA Biplane Vol: 25.7 ml 17.06 ml/m  AORTIC VALVE LVOT Vmax:   121.00 cm/s LVOT Vmean:  79.000 cm/s LVOT VTI:    0.210 m  AORTA Ao Root diam: 2.30 cm Ao Asc diam:  2.50 cm MITRAL VALVE                TRICUSPID VALVE MV Area (PHT): 4.63 cm     TR Peak grad:    24.2 mmHg MV Decel Time: 164 msec     TR Vmax:        246.00 cm/s MV E velocity: 124.00 cm/s MV Filemon Breton velocity: 67.90 cm/s   SHUNTS MV E/Shekela Goodridge ratio:  1.83         Systemic VTI:  0.21 m                             Systemic Diam: 1.80 cm Vishnu Priya Mallipeddi Electronically signed by Diannah Late Mallipeddi Signature Date/Time: 10/17/2024/1:19:24 PM    Final    CT CERVICAL SPINE W CONTRAST Result Date: 10/17/2024 EXAM: CT CERVICAL SPINE WITH CONTRAST 10/17/2024 11:54:53 AM TECHNIQUE: CT of the cervical spine was performed with the administration of 100 mL of iohexol  (OMNIPAQUE ) 300 MG/ML solution. Multiplanar reformatted images are provided for review. Automated exposure control, iterative reconstruction, and/or weight based adjustment of the mA/kV was utilized to reduce the radiation dose to as low as reasonably achievable. COMPARISON: None available. CLINICAL HISTORY: Neck pain, infection suspected, no prior imaging. FINDINGS: CERVICAL SPINE: BONES AND ALIGNMENT: No acute fracture or traumatic malalignment. DEGENERATIVE CHANGES: No significant degenerative changes. SOFT TISSUES: Limited soft tissue imaging on the CT of the cervical spine. There is mild subcutaneous fat stranding within the posterior right neck. No abscess or fluid collection. Anterior neck is excluded from view. IMPRESSION: 1. Mild subcutaneous fat stranding within the posterior right neck without abscess or fluid collection. 2. Anterior neck not evaluated on this study due to exclusion from the field of view. 3. No acute abnormality of the cervical spine. Electronically signed by: Franky Stanford MD 10/17/2024 12:06 PM EDT RP Workstation: [id number]   CT MAXILLOFACIAL W CONTRAST Result Date: 10/17/2024 EXAM: CT Face with contrast 10/17/2024 11:54:53 AM TECHNIQUE: CT of the face was performed with the administration of 100 mL of iohexol  (OMNIPAQUE ) 300 MG/ML solution. Multiplanar reformatted images are provided for review. Automated exposure control,  iterative reconstruction, and/or weight based adjustment of the mA/kV was utilized to reduce the radiation dose to as low as reasonably achievable. COMPARISON: None available CLINICAL HISTORY: Facial Cellulitis FINDINGS: AERODIGESTIVE TRACT: No mass. No edema. SALIVARY GLANDS: No acute abnormality. LYMPH NODES: No suspicious cervical lymphadenopathy. SOFT TISSUES: Subcutaneous fat stranding is present throughout much of the facial soft tissues, worse in the right lower face. No abscess or drainable fluid collection. No clear infectious source. BRAIN, ORBITS AND SINUSES: No acute abnormality. BONES: No acute abnormality. No suspicious bone lesion. IMPRESSION: 1. Subcutaneous fat stranding throughout much of the facial soft tissues, worst in the right lower face, consistent with facial cellulitis. 2. No abscess or drainable fluid collection. 3. No clear infectious source identified. Electronically signed by: Franky Stanford MD 10/17/2024 12:03  PM EDT RP Workstation: [id number]   DG Foot Complete Right Result Date: 10/16/2024 CLINICAL DATA:  Right foot ulcer EXAM: RIGHT FOOT COMPLETE - 3+ VIEW COMPARISON:  None Available. FINDINGS: Frontal, oblique, lateral views of the right foot are obtained. Large area of soft tissue ulceration within the lateral forefoot, with extensive subcutaneous gas throughout the forefoot and midfoot consistent with ulceration and cellulitis with gas-forming organism. There are areas of focal bony destruction involving the fifth digit phalanges, head of the fifth metatarsal, and head of the fourth metatarsal, consistent with osteomyelitis and septic arthritis. There are no acute displaced fractures. IMPRESSION: 1. Soft tissue ulceration involving the lateral forefoot, with extensive soft tissue gas throughout the lateral forefoot and midfoot. Findings are consistent with cellulitis and infection with gas-forming organism. 2. Areas of bony destruction involving the fifth digit phalanges, head  of the fifth metatarsal, and head of the fourth metatarsal, compatible with osteomyelitis and possible septic arthritis. 3. No acute fracture. Electronically Signed   By: Ozell Daring M.D.   On: 10/16/2024 16:23   DG Chest Port 1 View Result Date: 10/16/2024 CLINICAL DATA:  Sepsis EXAM: PORTABLE CHEST 1 VIEW COMPARISON:  07/01/2024 FINDINGS: The heart size and mediastinal contours are within normal limits. Both lungs are clear. The visualized skeletal structures are unremarkable. IMPRESSION: No active disease. Electronically Signed   By: Ozell Daring M.D.   On: 10/16/2024 16:21        Scheduled Meds:  azithromycin  500 mg Oral Daily   Chlorhexidine Gluconate Cloth  6 each Topical Daily   collagenase   Topical Daily   enoxaparin  (LOVENOX ) injection  40 mg Subcutaneous Q24H   hydrOXYzine   50 mg Oral BID   insulin  aspart  0-9 Units Subcutaneous Q4H   insulin  glargine-yfgn  15 Units Subcutaneous Daily   midodrine  2.5 mg Oral TID WC   nystatin  5 mL Oral QID   Continuous Infusions:  ampicillin-sulbactam (UNASYN) IV 3 g (10/18/24 0756)   linezolid (ZYVOX) IV 600 mg (10/18/24 9367)   vancomycin 1,250 mg (10/17/24 2346)     LOS: 2 days    Time spent: over 30 min     Meliton Monte, MD [facility]   To contact the attending provider between 7A-7P or the covering provider during after hours 7P-7A, please log into the web site [email or web address] and access using universal [place] password for that web site. If you do not have the password, please call the hospital operator.  10/18/2024, 8:42 AM

## 2024-10-19 ENCOUNTER — Inpatient Hospital Stay (HOSPITAL_COMMUNITY)

## 2024-10-19 DIAGNOSIS — L309 Dermatitis, unspecified: Secondary | ICD-10-CM

## 2024-10-19 DIAGNOSIS — M86171 Other acute osteomyelitis, right ankle and foot: Secondary | ICD-10-CM | POA: Diagnosis not present

## 2024-10-19 DIAGNOSIS — A09 Infectious gastroenteritis and colitis, unspecified: Secondary | ICD-10-CM | POA: Diagnosis not present

## 2024-10-19 DIAGNOSIS — I96 Gangrene, not elsewhere classified: Secondary | ICD-10-CM | POA: Diagnosis not present

## 2024-10-19 DIAGNOSIS — L02611 Cutaneous abscess of right foot: Secondary | ICD-10-CM | POA: Diagnosis not present

## 2024-10-19 LAB — COMPREHENSIVE METABOLIC PANEL WITH GFR
ALT: 19 U/L (ref 0–44)
AST: 35 U/L (ref 15–41)
Albumin: <1.5 g/dL — ABNORMAL LOW (ref 3.5–5.0)
Alkaline Phosphatase: 194 U/L — ABNORMAL HIGH (ref 38–126)
Anion gap: 11 (ref 5–15)
BUN: <5 mg/dL — ABNORMAL LOW (ref 6–20)
CO2: 26 mmol/L (ref 22–32)
Calcium: 7.6 mg/dL — ABNORMAL LOW (ref 8.9–10.3)
Chloride: 93 mmol/L — ABNORMAL LOW (ref 98–111)
Creatinine, Ser: 0.49 mg/dL (ref 0.44–1.00)
GFR, Estimated: >60 mL/min (ref >60)
Glucose, Bld: 116 mg/dL — ABNORMAL HIGH (ref 70–99)
Potassium: 3.1 mmol/L — ABNORMAL LOW (ref 3.5–5.1)
Sodium: 130 mmol/L — ABNORMAL LOW (ref 135–145)
Total Bilirubin: 0.4 mg/dL (ref 0.0–1.2)
Total Protein: 6.7 g/dL (ref 6.5–8.1)

## 2024-10-19 LAB — GLUCOSE, CAPILLARY
Glucose-Capillary: 151 mg/dL — ABNORMAL HIGH (ref 70–99)
Glucose-Capillary: 84 mg/dL (ref 70–99)
Glucose-Capillary: 96 mg/dL (ref 70–99)
Glucose-Capillary: 122 mg/dL — ABNORMAL HIGH (ref 70–99)
Glucose-Capillary: 203 mg/dL — ABNORMAL HIGH (ref 70–99)
Glucose-Capillary: 225 mg/dL — ABNORMAL HIGH (ref 70–99)
Glucose-Capillary: 105 mg/dL — ABNORMAL HIGH (ref 70–99)

## 2024-10-19 LAB — HEMOGLOBIN A1C
Hgb A1c MFr Bld: >15.5 % — ABNORMAL HIGH (ref 4.8–5.6)
Hgb A1c MFr Bld: >15.5 % — ABNORMAL HIGH (ref 4.8–5.6)
Mean Plasma Glucose: >398 mg/dL
Mean Plasma Glucose: >398 mg/dL

## 2024-10-19 LAB — CBC
HCT: 25.4 % — ABNORMAL LOW (ref 36.0–46.0)
Hemoglobin: 8 g/dL — ABNORMAL LOW (ref 12.0–15.0)
MCH: 24.2 pg — ABNORMAL LOW (ref 26.0–34.0)
MCHC: 31.5 g/dL (ref 30.0–36.0)
MCV: 76.7 fL — ABNORMAL LOW (ref 80.0–100.0)
Platelets: 558 K/uL — ABNORMAL HIGH (ref 150–400)
RBC: 3.31 MIL/uL — ABNORMAL LOW (ref 3.87–5.11)
RDW: 15.1 % (ref 11.5–15.5)
WBC: 25.5 K/uL — ABNORMAL HIGH (ref 4.0–10.5)
nRBC: 0 % (ref 0.0–0.2)

## 2024-10-19 LAB — IRON AND TIBC
Iron: <10 ug/dL — ABNORMAL LOW (ref 28–170)
TIBC: 160 ug/dL — ABNORMAL LOW (ref 250–450)

## 2024-10-19 LAB — RETICULOCYTES
Immature Retic Fract: 33.6 % — ABNORMAL HIGH (ref 2.3–15.9)
RBC.: 3.23 MIL/uL — ABNORMAL LOW (ref 3.87–5.11)
Retic Count, Absolute: 42 K/uL (ref 19.0–186.0)
Retic Ct Pct: 1.3 % (ref 0.4–3.1)

## 2024-10-19 LAB — FOLATE: Folate: 8.3 ng/mL (ref >5.9)

## 2024-10-19 LAB — VITAMIN B12: Vitamin B-12: 945 pg/mL — ABNORMAL HIGH (ref 180–914)

## 2024-10-19 LAB — MAGNESIUM: Magnesium: 1.4 mg/dL — ABNORMAL LOW (ref 1.7–2.4)

## 2024-10-19 LAB — FERRITIN: Ferritin: 205 ng/mL (ref 11–307)

## 2024-10-19 LAB — PHOSPHORUS: Phosphorus: 2.9 mg/dL (ref 2.5–4.6)

## 2024-10-19 MED ORDER — MIDODRINE HCL 5 MG PO TABS
5.0000 mg | ORAL_TABLET | Freq: Three times a day (TID) | ORAL | Status: DC
  Administered 2024-10-19 (×2): 5 mg via ORAL
  Filled 2024-10-19 (×2): qty 1

## 2024-10-19 MED ORDER — LOPERAMIDE HCL 2 MG PO CAPS
2.0000 mg | ORAL_CAPSULE | ORAL | Status: DC | PRN
  Administered 2024-10-19 – 2024-10-21 (×3): 2 mg via ORAL
  Filled 2024-10-19 (×3): qty 1

## 2024-10-19 MED ORDER — FERROUS SULFATE 325 (65 FE) MG PO TABS
325.0000 mg | ORAL_TABLET | ORAL | Status: DC
Start: 2024-10-19 — End: 2024-11-01
  Administered 2024-10-19 – 2024-10-31 (×6): 325 mg via ORAL
  Filled 2024-10-19 (×8): qty 1

## 2024-10-19 MED ORDER — LACTATED RINGERS IV BOLUS
500.0000 mL | Freq: Once | INTRAVENOUS | Status: AC
  Administered 2024-10-19: 500 mL via INTRAVENOUS

## 2024-10-19 MED ORDER — MAGNESIUM SULFATE 2 GM/50ML IV SOLN
2.0000 g | Freq: Once | INTRAVENOUS | Status: AC
  Administered 2024-10-19: 2 g via INTRAVENOUS
  Filled 2024-10-19: qty 50

## 2024-10-19 MED ORDER — POTASSIUM CHLORIDE CRYS ER 20 MEQ PO TBCR
40.0000 meq | EXTENDED_RELEASE_TABLET | ORAL | Status: AC
  Administered 2024-10-19 (×2): 40 meq via ORAL
  Filled 2024-10-19 (×2): qty 2

## 2024-10-19 NOTE — Consult Note (Signed)
 ORTHOPAEDIC CONSULTATION  REQUESTING PHYSICIAN: Perri DELENA Meliton Mickey., *  Chief Complaint: Gangrenous changes right foot  HPI: Phyllis Pittman is a 48 y.o. female who presents with extensive gangrenous changes right foot.  Patient states she is had these changes for several weeks.  Patient with a history of uncontrolled type 2 diabetes.  Past Medical History:  Diagnosis Date   Anxiety    Cancer (HCC)    neoplasm of cervix   CVA (cerebral vascular accident) (HCC)    hx of   Depression with anxiety    Diabetes mellitus without complication (HCC)    type 2   Female hirsutism    Fibromyalgia    GERD without esophagitis    Hyperlipidemia    Hypertension    IBS (irritable bowel syndrome)    Long term (current) use of insulin  (HCC)    Migraine    Myopia of both eyes    Neuropathy    feet   Neuropathy    OSA (obstructive sleep apnea)    Sciatica    Shingles    Past Surgical History:  Procedure Laterality Date   CESAREAN SECTION     05/2001, 07/2012   CHOLECYSTECTOMY  10/1999   FACIAL COSMETIC SURGERY     FOOT SURGERY     Social History   Socioeconomic History   Marital status: Significant Other    Spouse name: Not on file   Number of children: 3   Years of education: Not on file   Highest education level: Not on file  Occupational History    Comment: home maker  Tobacco Use   Smoking status: Every Day    Current packs/day: 0.25    Types: Cigarettes   Smokeless tobacco: Never   Tobacco comments:    5 or less a day  Vaping Use   Vaping status: Never Used  Substance and Sexual Activity   Alcohol use: Yes    Comment: occ   Drug use: No   Sexual activity: Not on file  Other Topics Concern   Not on file  Social History Narrative   Lives with partner   Social Drivers of Health   Financial Resource Strain: Not on file  Food Insecurity: Patient Declined (10/18/2024)   Hunger Vital Sign    Worried About Running Out of Food in the Last Year: Patient  declined    Ran Out of Food in the Last Year: Patient declined  Transportation Needs: Unknown (10/18/2024)   PRAPARE - Administrator, Civil Service (Medical): Patient declined    Lack of Transportation (Non-Medical): No  Physical Activity: Not on file  Stress: Not on file  Social Connections: Unknown (04/20/2022)   Received from [facility]   Social Network    Social Network: Not on file   Family History  Problem Relation Age of Onset   COPD Mother    Diabetes Mother    Cancer Mother    Diabetes Father    Cancer Father    - negative except otherwise stated in the family history section Allergies  Allergen Reactions   Acyclovir Itching and Nausea Only   Dust Mite Extract Anaphylaxis, Hives, Shortness Of Breath, Itching, Swelling, Anxiety, Dermatitis and Rash   Molds & Smuts Anaphylaxis, Hives, Shortness Of Breath, Itching, Nausea Only, Swelling, Anxiety and Rash   Other Hives and Other (See Comments)    Dust, mold, powder   Prednisone Other (See Comments)    Hyperglycemia    Venlafaxine  Shortness Of Breath and Other (See Comments)    Chest discomfort, too   Cephalexin  Nausea Only and Other (See Comments)    Tolerates via IV - not pills   Dulaglutide Nausea And Vomiting and Other (See Comments)    TRULICITY   Latex Dermatitis, Rash and Other (See Comments)    Powder only   Levofloxacin Nausea And Vomiting and Other (See Comments)    Can receive only via IV   Metformin Nausea And Vomiting   Semaglutide Other (See Comments)    Made her really sick (Ozempic or Wegovy)   Sulfa Antibiotics Nausea Only and Other (See Comments)    Stomach upset   Codeine Other (See Comments)    Unknown reaction   Famotidine Other (See Comments)    Reaction not cited   Prior to Admission medications   Medication Sig Start Date End Date Taking? Authorizing Provider  cetirizine (ZYRTEC) 10 MG tablet Take 10 mg by mouth daily.   Yes [provider]  gabapentin  (NEURONTIN) 800 MG tablet Take 800 mg by mouth in the morning, at noon, and at bedtime.   Yes [provider]  hydrOXYzine  (ATARAX ) 50 MG tablet Take 50 mg by mouth in the morning and at bedtime.   Yes [provider]  insulin  lispro (HUMALOG) 100 UNIT/ML injection Inject 3-5 Units into the skin 3 (three) times daily with meals.   Yes [provider]  ketoconazole (NIZORAL) 2 % cream Apply 1 Application topically See admin instructions. Apply to affected area 2 times a day   Yes [provider]  loperamide (IMODIUM) 2 MG capsule Take 2 mg by mouth at bedtime as needed for diarrhea or loose stools.   Yes [provider]  naproxen sodium (ALEVE) 220 MG tablet Take 440 mg by mouth at bedtime.   Yes [provider]  omeprazole (PRILOSEC) 40 MG capsule Take 40 mg by mouth at bedtime.   Yes [provider]  ondansetron  (ZOFRAN -ODT) 4 MG disintegrating tablet Take 4 mg by mouth every 6 (six) hours as needed for nausea or vomiting (dissolve orally).   Yes [provider]  oxyCODONE-acetaminophen  (PERCOCET) 10-325 MG tablet Take 1 tablet by mouth 4 (four) times daily.   Yes [provider]  rOPINIRole (REQUIP) 0.25 MG tablet Take 0.25 mg by mouth at bedtime.   Yes [provider]  tiZANidine (ZANAFLEX) 4 MG tablet Take 4 mg by mouth at bedtime.   Yes [provider]  traZODone (DESYREL) 50 MG tablet Take 50-100 mg by mouth at bedtime.   Yes [provider]  TRESIBA FLEXTOUCH 100 UNIT/ML FlexTouch Pen Inject 16 Units into the skin at bedtime.   Yes [provider]  ALPRAZolam  (XANAX ) 0.5 MG tablet Take 1 tablet (0.5 mg total) by mouth at bedtime as needed for anxiety. Patient not taking: Reported on 10/16/2024 07/13/21   Patt Alm Macho, MD  cyclobenzaprine  (FLEXERIL ) 5 MG tablet Take 1 tablet (5 mg total) by mouth 3 (three) times daily as needed for muscle spasms. Patient not taking: Reported  on 10/16/2024 07/13/21   Patt Alm Macho, MD  Insulin  Glargine (BASAGLAR  [facility]) 100 UNIT/ML Inject 30 Units into the skin at bedtime. Patient not taking: Reported on 10/16/2024 08/14/21 10/16/24  Rojelio Nest, DO  ondansetron  (ZOFRAN  ODT) 8 MG disintegrating tablet Take 1 tablet (8 mg total) by mouth every 8 (eight) hours as needed for nausea or vomiting. Patient not taking: Reported on 10/16/2024 02/22/18   Kirichenko,  Davied, PA-C   MR FOOT RIGHT WO CONTRAST Result Date: 10/18/2024 EXAM: MRI of the right Foot without contrast. 10/17/2024 10:13:00 PM TECHNIQUE: Multiplanar multisequence MRI of the right foot was performed from the level of the Lisfranc joint through the toes without the administration of intravenous contrast. COMPARISON: Radiographs 10/16/2024. CLINICAL HISTORY: Osteomyelitis, Right foot. LIMITATIONS/ARTIFACTS: Motion artifact is present, reducing diagnostic sensitivity and specificity. FINDINGS: LISFRANC JOINT: Visualized Lisfranc ligament is intact. BONE MARROW: Abnormal signal compatible with osteomyelitis involving the 5th metatarsal, 4th metatarsal head, and proximal phalanges of the 4th and 5th toes. Associated abnormal gas density is present in the proximal phalanges and in the middle and distal phalanges of the small toe, compatible with a gas-forming organism and osteomyelitis. GREATER AND LESSER MTP JOINTS: No significant joint effusion or osseous erosions. No significant degenerative changes. Normal alignment. SOFT TISSUES: Extensive gas tracts are present in the soft tissues of the dorsum of the foot, especially laterally, compatible with cellulitis with a gas-producing organism. Abnormal gas densities also track along the plantar fascia, flexor digitorum longus, and flexor digitorum longus musculature of the foot, compatible with infectious myositis and tenosynovitis. Cutaneous irregularity is noted along the dorsum of the foot, suggesting ulceration. Circumferential  subcutaneous edema is present in the forefoot and tracking into the toes. Generalized muscle edema may be due to neurogenic causes or infectious myositis. TENDONS: Abnormal gas densities track along the plantar fascia, flexor digitorum longus, and flexor digitorum longus musculature of the foot, compatible with infectious myositis and tenosynovitis. IMPRESSION: 1. 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 2. Extensive gas in the dorsal soft tissues, especially laterally, compatible with cellulitis due to a gas-producing organism. Possible associated ulceration/slash blistering. 3. Gas tracking along the plantar fascia and flexor digitorum longus tendon and musculature, compatible with infectious myositis and tenosynovitis. 4. Circumferential subcutaneous edema of the forefoot with extension into the toes. 5. Generalized muscle edema, which may reflect neurogenic change or infectious myositis. 6. Motion artifact reduces diagnostic sensitivity and specificity. Electronically signed by: Ryan Salvage MD 10/18/2024 10:43 AM EST RP Workstation: [id number]   ECHOCARDIOGRAM COMPLETE Result Date: 10/17/2024    ECHOCARDIOGRAM REPORT   Patient Name:   Phyllis Pittman Date of Exam: 10/17/2024 Medical Rec #:  [id number]       Height:       60.0 in Accession #:    [id number]      Weight:       120.8 lb Date of Birth:  [birth year]       BSA:          1.507 m Patient Age:    48 years        BP:           91/42 mmHg Patient Gender: F               HR:           76 bpm. Exam Location:  Inpatient Procedure: 2D Echo, Cardiac Doppler and Color Doppler (Both Spectral and Color            Flow Doppler were utilized during procedure). Indications:    Bacteremia R78.81  History:        Patient has no prior history of Echocardiogram examinations. Hx  of CVA, Signs/Symptoms:Bacteremia; Risk Factors:Diabetes,                 Hypertension, Dyslipidemia and Sleep Apnea.  Sonographer:    Koleen Popper RDCS Referring Phys: [id number] ALEJANDRO LATIF [facility] IMPRESSIONS  1. Left ventricular ejection fraction, by estimation, is 60 to 65%. The left ventricle has normal function. The left ventricle has no regional wall motion abnormalities. Left ventricular diastolic parameters were normal.  2. Right ventricular systolic function is normal. The right ventricular size is normal. There is normal pulmonary artery systolic pressure.  3. The mitral valve is normal in structure. No evidence of mitral valve regurgitation. No evidence of mitral stenosis.  4. The aortic valve has an indeterminant number of cusps. Aortic valve regurgitation is not visualized. No aortic stenosis is present.  5. The inferior vena cava is dilated in size with >50% respiratory variability, suggesting right atrial pressure of 8 mmHg. Comparison(s): No prior Echocardiogram. Conclusion(s)/Recommendation(s): No evidence of valvular vegetations on this transthoracic echocardiogram. Consider a transesophageal echocardiogram to exclude infective endocarditis if clinically indicated. FINDINGS  Left Ventricle: Left ventricular ejection fraction, by estimation, is 60 to 65%. The left ventricle has normal function. The left ventricle has no regional wall motion abnormalities. Strain was performed and the global longitudinal strain is indeterminate. The left ventricular internal cavity size was normal in size. There is no left ventricular hypertrophy. Left ventricular diastolic parameters were normal. Right Ventricle: The right ventricular size is normal. No increase in right ventricular wall thickness. Right ventricular systolic function is normal. There is normal pulmonary artery systolic pressure. The tricuspid regurgitant velocity is 2.46 m/s, and  with an assumed right atrial pressure of 8 mmHg, the estimated  right ventricular systolic pressure is 32.2 mmHg. Left Atrium: Left atrial size was normal in size. Right Atrium: Right atrial size was normal in size. Pericardium: There is no evidence of pericardial effusion. Mitral Valve: The mitral valve is normal in structure. No evidence of mitral valve regurgitation. No evidence of mitral valve stenosis. Tricuspid Valve: The tricuspid valve is normal in structure. Tricuspid valve regurgitation is trivial. No evidence of tricuspid stenosis. Aortic Valve: The aortic valve has an indeterminant number of cusps. Aortic valve regurgitation is not visualized. No aortic stenosis is present. Pulmonic Valve: The pulmonic valve was grossly normal. Pulmonic valve regurgitation is not visualized. No evidence of pulmonic stenosis. Aorta: The aortic root and ascending aorta are structurally normal, with no evidence of dilitation. Venous: The inferior vena cava is dilated in size with greater than 50% respiratory variability, suggesting right atrial pressure of 8 mmHg. IAS/Shunts: No atrial level shunt detected by color flow Doppler. Additional Comments: 3D was performed not requiring image post processing on an independent workstation and was indeterminate.  LEFT VENTRICLE PLAX 2D LVIDd:         4.50 cm   Diastology LVIDs:         2.80 cm   LV e' medial:    11.10 cm/s LV PW:         0.70 cm   LV E/e' medial:  11.2 LV IVS:        0.90 cm   LV e' lateral:   12.70 cm/s LVOT diam:     1.80 cm   LV E/e' lateral: 9.8 LV SV:         53 LV SV Index:   35 LVOT Area:     2.54 cm  RIGHT VENTRICLE  IVC RV Basal diam:  3.40 cm     IVC diam: 2.50 cm RV S prime:     12.80 cm/s TAPSE (M-mode): 3.2 cm LEFT ATRIUM             Index        RIGHT ATRIUM          Index LA diam:        3.10 cm 2.06 cm/m   RA Area:     8.54 cm LA Vol (A2C):   23.1 ml 15.33 ml/m  RA Volume:   14.00 ml 9.29 ml/m LA Vol (A4C):   27.3 ml 18.12 ml/m LA Biplane Vol: 25.7 ml 17.06 ml/m  AORTIC VALVE LVOT Vmax:   121.00  cm/s LVOT Vmean:  79.000 cm/s LVOT VTI:    0.210 m  AORTA Ao Root diam: 2.30 cm Ao Asc diam:  2.50 cm MITRAL VALVE                TRICUSPID VALVE MV Area (PHT): 4.63 cm     TR Peak grad:   24.2 mmHg MV Decel Time: 164 msec     TR Vmax:        246.00 cm/s MV E velocity: 124.00 cm/s MV A velocity: 67.90 cm/s   SHUNTS MV E/A ratio:  1.83         Systemic VTI:  0.21 m                             Systemic Diam: 1.80 cm Vishnu Priya Mallipeddi Electronically signed by Diannah Late Mallipeddi Signature Date/Time: 10/17/2024/1:19:24 PM    Final    CT CERVICAL SPINE W CONTRAST Result Date: 10/17/2024 EXAM: CT CERVICAL SPINE WITH CONTRAST 10/17/2024 11:54:53 AM TECHNIQUE: CT of the cervical spine was performed with the administration of 100 mL of iohexol  (OMNIPAQUE ) 300 MG/ML solution. Multiplanar reformatted images are provided for review. Automated exposure control, iterative reconstruction, and/or weight based adjustment of the mA/kV was utilized to reduce the radiation dose to as low as reasonably achievable. COMPARISON: None available. CLINICAL HISTORY: Neck pain, infection suspected, no prior imaging. FINDINGS: CERVICAL SPINE: BONES AND ALIGNMENT: No acute fracture or traumatic malalignment. DEGENERATIVE CHANGES: No significant degenerative changes. SOFT TISSUES: Limited soft tissue imaging on the CT of the cervical spine. There is mild subcutaneous fat stranding within the posterior right neck. No abscess or fluid collection. Anterior neck is excluded from view. IMPRESSION: 1. Mild subcutaneous fat stranding within the posterior right neck without abscess or fluid collection. 2. Anterior neck not evaluated on this study due to exclusion from the field of view. 3. No acute abnormality of the cervical spine. Electronically signed by: Franky Stanford MD 10/17/2024 12:06 PM EDT RP Workstation: [id number]   CT MAXILLOFACIAL W CONTRAST Result Date: 10/17/2024 EXAM: CT Face with contrast 10/17/2024 11:54:53 AM TECHNIQUE:  CT of the face was performed with the administration of 100 mL of iohexol  (OMNIPAQUE ) 300 MG/ML solution. Multiplanar reformatted images are provided for review. Automated exposure control, iterative reconstruction, and/or weight based adjustment of the mA/kV was utilized to reduce the radiation dose to as low as reasonably achievable. COMPARISON: None available CLINICAL HISTORY: Facial Cellulitis FINDINGS: AERODIGESTIVE TRACT: No mass. No edema. SALIVARY GLANDS: No acute abnormality. LYMPH NODES: No suspicious cervical lymphadenopathy. SOFT TISSUES: Subcutaneous fat stranding is present throughout much of the facial soft tissues, worse in the right lower face. No abscess or drainable fluid collection. No  clear infectious source. BRAIN, ORBITS AND SINUSES: No acute abnormality. BONES: No acute abnormality. No suspicious bone lesion. IMPRESSION: 1. Subcutaneous fat stranding throughout much of the facial soft tissues, worst in the right lower face, consistent with facial cellulitis. 2. No abscess or drainable fluid collection. 3. No clear infectious source identified. Electronically signed by: Franky Stanford MD 10/17/2024 12:03 PM EDT RP Workstation: [id number]   - pertinent xrays, CT, MRI studies were reviewed and independently interpreted  Positive ROS: All other systems have been reviewed and were otherwise negative with the exception of those mentioned in the HPI and as above.  Physical Exam: General: Alert, no acute distress Psychiatric: Patient is competent for consent with normal mood and affect Lymphatic: No axillary or cervical lymphadenopathy Cardiovascular: No pedal edema Respiratory: No cyanosis, no use of accessory musculature GI: No organomegaly, abdomen is soft and non-tender    Images:  @ENCIMAGES @  Labs:  Lab Results  Component Value Date   HGBA1C 12.8 (H) 08/13/2021   ESRSEDRATE 136 (H) 10/16/2024   CRP 34.6 (H) 10/16/2024   REPTSTATUS 10/18/2024 FINAL 10/16/2024    GRAMSTAIN NO WBC SEEN NO ORGANISMS SEEN CYTOSPIN SMEAR  08/12/2021   GRAMSTAIN  08/12/2021    NO SQUAMOUS EPITHELIAL CELLS SEEN NO WBC SEEN NO ORGANISMS SEEN Performed at [facility], [address], [place], [place] [address]    CULT MULTIPLE SPECIES PRESENT, SUGGEST RECOLLECTION (A) 10/16/2024    Lab Results  Component Value Date   ALBUMIN 2.0 (L) 10/17/2024   ALBUMIN 3.1 (L) 10/16/2024   ALBUMIN 4.0 08/11/2021        Latest Ref Rng & Units 10/17/2024    7:23 AM 10/16/2024    9:12 PM 10/16/2024    4:32 PM  CBC EXTENDED  WBC 4.0 - 10.5 K/uL 28.1  32.2  34.2   RBC 3.87 - 5.11 MIL/uL 3.33  3.82  4.57   Hemoglobin 12.0 - 15.0 g/dL 8.1  9.3  89.0   HCT 63.9 - 46.0 % 26.5  30.1  35.9   Platelets 150 - 400 K/uL 462  523  552   NEUT# 1.7 - 7.7 K/uL   29.3   Lymph# 0.7 - 4.0 K/uL   1.9     Neurologic: Patient does not have protective sensation bilateral lower extremities.   MUSCULOSKELETAL:   Skin: Examination almost the entire dorsum of the right foot is black and gangrenous.  There is exposed bone on the 4th and 5th toes with necrotic gangrenous changes.  MRI scan shows extensive cellulitis abscess and osteomyelitis involving the right foot.  White cell count 28.1 with a hemoglobin of 8.1.  Albumin 2.0.  Hemoglobin A1c 12.8 with a C-reactive protein of 34.6.  Sed rate 136.  LRINEC score 11.  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  Plan: Plan: Will plan for a right below-knee amputation on Wednesday.  Risks and benefits of surgery were discussed including need for additional surgery.  Patient states she understands wished to proceed at this time.  Thank you for the consult and the opportunity to see Phyllis Pittman  Jerona Sage, MD [facility] [phone] 7:41 AM

## 2024-10-19 NOTE — Progress Notes (Signed)
 [facility] night cross cover note:   I was notified by the patient's RN of most recent BP of 88/56 , completely asymptomatic at this time.  Other vital signs appear stable, including afebrile, heart rates in the 70s to 80s, and oxygen saturation in the mid 90s on room air.  I subsequently ordered a 500 cc LR bolus for her.   Eva Pore, DO Hospitalist

## 2024-10-19 NOTE — Progress Notes (Signed)
 PROGRESS NOTE    Phyllis Pittman  FMW:[id number] DOB: [birth year] DOA: 10/16/2024 PCP: Phyllis Rosina SAILOR, PA  Chief Complaint  Patient presents with   Wound Infection   Facial Swelling    Brief Narrative:   48 y.o. female with medical history significant for anxiety, history of CVA, depression with anxiety, diabetes mellitus type 2 which is uncontrolled, hypertension, hyperlipidemia GERD, migraines, neuropathy, sciatica as well as other comorbidities who was recently treated for Phyllis Pittman left foot infection and had Phyllis Pittman PICC line.  She went to go see her wound care specialist last week and was reportedly found to have Phyllis Pittman new wound with Phyllis Pittman fever at that time.  At that time her right foot was swollen and erythematous up to the ankle with congested discolored small toe with multiple open wounds and had Phyllis Pittman unstable lateral aspect concerning small toe as well as circumferential blistering of the fourth webspace.  On 10/07/2024 she was advised to go to the emergency room for further evaluation and antibiotics and formal debridement.  She did not go to the ER and kept her Vashe wet-to-dry dressings in place and went back to the wound care center on 10/14/2024 where she was again advised to go to the ED given that her foot had developed and necrotic duskiness and dark eschar on both the plantar and dorsal surface 6 as well as an extensive order.     Patient's Wound care physician had found that the wound had progressively gotten worse and she was noted to be lethargic with malaise.  EMS was called during this clinic visit but she refused to go with EMS to the hospital for further evaluation.  Subsequently she left the clinic despite the advice of the wound care physician to go to the ED.  She now presents 2 days later with worsening foul-smelling drainage, pain and development of Phyllis Pittman facial rash since Sunday.  She continues to complain of pain and feeling ill.  Denies chest pain, shortness of breath but states that she has  been having diarrhea and abdominal discomfort and attributes it to her history of IBS.  Also complains of new facial erythema and swelling and thinks it is because she dropped her phone on her face when laying in bed.  Denies any other concerns or complaints at this time.  Assessment & Plan:   Principal Problem:   Osteomyelitis (HCC) Active Problems:   Acute osteomyelitis of phalanx of right foot (HCC)   Cutaneous abscess of right foot  Sepsis Diabetic Foot Infection  Cellulitis and Possible Septic Arthritis Rules in with fever (last 11/1 PM), leukocytosis, diabetic foot infection  Plain films with findings consistent with cellulitis and infection with gas forming organism on R foot.  Osteomyelitis and possible septic arthritis. MRI currently pending.  Abx with vanc, unasyn, and linzolid per previous provider's with ID Palpable PT pulse.  ABI pending.  Vascular does not think the foot is salvageable and likely to require major amputation regardless of vascular studies. Ortho c/s, appreciate recs - awaiting vascular studies - Phyllis Pittman planning R BKA Wednesday. Blood culture x1 (10/31) NGTD Sed rate 137, CRP 34.6 Urine cx multiple species.  Negative MRSA PCR.  Uncontrolled T2DM Repeat A1c Basal, SSI - adjust as needed  Hypotension  Currently on midodrine  BP fluctuating - follow  Will add holding parameters for midodrine, BP much improved  Anemia Pending labs today  Dermatitis of unclear cause Excoriations, erythema, and scaling noted to dorsal aspect of hands as well as  her face CT max face with findings c/w facial cellulitis  Given interdigital rash, excoriations, considered scabies, but appearance not classic - will hold off on treatment given chronicity (also no one in home with similar rash). Apparently is chronic, follows with derm for this.    Mild Subcutaneous fat stranding within posterior R neck Will monitor   History of POTS Noted   Diarrhea GI path panel with  EPEC C diff negative Completed course of azithro Imodium prn  Hyponatremia Labs pending today  Hypophosphatemia  Hypomagnesemia  Hypercalcemia Labs pending today   Elevated Anion Gap Resolved, labs pending today  Hypoalbuminemia In setting of her acute infection Follow - labs still pending  Decubitus Wounds Appreciate wound care c/s  She was refusing labs - agreeable today after discussion - will plan for lab holiday 11/4, labs again 11/5  Uses walker at baseline.  Drives.  Cooks, dresses herself.  Per discussion with family, doesn't do Phyllis Pittman great job taking care of her health.      DVT prophylaxis: lovenox  Code Status: full Family Communication: none Disposition:   Status is: Inpatient Remains inpatient appropriate because: need for continued inpatient care   Consultants:  Vascular ID ortho  Procedures:  Echo IMPRESSIONS     1. Left ventricular ejection fraction, by estimation, is 60 to 65%. The  left ventricle has normal function. The left ventricle has no regional  wall motion abnormalities. Left ventricular diastolic parameters were  normal.   2. Right ventricular systolic function is normal. The right ventricular  size is normal. There is normal pulmonary artery systolic pressure.   3. The mitral valve is normal in structure. No evidence of mitral valve  regurgitation. No evidence of mitral stenosis.   4. The aortic valve has an indeterminant number of cusps. Aortic valve  regurgitation is not visualized. No aortic stenosis is present.   5. The inferior vena cava is dilated in size with >50% respiratory  variability, suggesting right atrial pressure of 8 mmHg.   Comparison(s): No prior Echocardiogram.   Conclusion(s)/Recommendation(s): No evidence of valvular vegetations on  this transthoracic echocardiogram. Consider Phyllis Pittman transesophageal  echocardiogram to exclude infective endocarditis if clinically indicated.   Antimicrobials:  Anti-infectives  (From admission, onward)    Start     Dose/Rate Route Frequency Ordered Stop   10/17/24 1945  Vancomycin (VANCOCIN) 1,250 mg in sodium chloride  0.9 % 250 mL IVPB  Status:  Discontinued       Placed in Followed by Linked Group   1,250 mg 166.7 mL/hr over 90 Minutes Intravenous Every 24 hours 10/16/24 1845 10/16/24 1847   10/17/24 1945  vancomycin (VANCOREADY) IVPB 1250 mg/250 mL       Placed in Followed by Linked Group   1,250 mg 166.7 mL/hr over 90 Minutes Intravenous Every 24 hours 10/16/24 1848     10/17/24 1800  azithromycin (ZITHROMAX) tablet 500 mg        500 mg Oral Daily 10/17/24 1709 10/19/24 0928   10/17/24 0530  linezolid (ZYVOX) IVPB 600 mg        600 mg 300 mL/hr over 60 Minutes Intravenous Every 12 hours 10/16/24 1752 10/27/24 0529   10/17/24 0130  Ampicillin-Sulbactam (UNASYN) 3 g in sodium chloride  0.9 % 100 mL IVPB        3 g 200 mL/hr over 30 Minutes Intravenous Every 6 hours 10/16/24 1845     10/16/24 2000  meropenem (MERREM) 1 g in sodium chloride  0.9 % 100 mL IVPB  Status:  Discontinued        1 g 200 mL/hr over 30 Minutes Intravenous Every 8 hours 10/16/24 1825 10/16/24 1831   10/16/24 1945  vancomycin (VANCOREADY) IVPB 1500 mg/300 mL       Placed in Followed by Linked Group   1,500 mg 150 mL/hr over 120 Minutes Intravenous  Once 10/16/24 1848 10/16/24 2249   10/16/24 1930  Vancomycin (VANCOCIN) 1,500 mg in sodium chloride  0.9 % 500 mL IVPB  Status:  Discontinued       Placed in Followed by Linked Group   1,500 mg 250 mL/hr over 120 Minutes Intravenous  Once 10/16/24 1845 10/16/24 1847   10/16/24 1800  valACYclovir  (VALTREX ) tablet 500 mg  Status:  Discontinued        500 mg Oral Daily 10/16/24 1752 10/16/24 2206   10/16/24 1800  meropenem (MERREM) 1 g in sodium chloride  0.9 % 100 mL IVPB  Status:  Discontinued        1 g 200 mL/hr over 30 Minutes Intravenous  Once 10/16/24 1752 10/16/24 1817   10/16/24 1600  Vancomycin (VANCOCIN) 1,500 mg in sodium  chloride 0.9 % 500 mL IVPB  Status:  Discontinued        1,500 mg 250 mL/hr over 120 Minutes Intravenous  Once 10/16/24 1506 10/16/24 1512   10/16/24 1600  Vancomycin (VANCOCIN) 1,500 mg in sodium chloride  0.9 % 500 mL IVPB  Status:  Discontinued        1,500 mg 250 mL/hr over 120 Minutes Intravenous  Once 10/16/24 1512 10/16/24 1525   10/16/24 1600  linezolid (ZYVOX) IVPB 600 mg        600 mg 300 mL/hr over 60 Minutes Intravenous  Once 10/16/24 1525 10/16/24 1835   10/16/24 1530  piperacillin-tazobactam (ZOSYN) IVPB 3.375 g        3.375 g 100 mL/hr over 30 Minutes Intravenous  Once 10/16/24 1506 10/16/24 1751       Subjective: No new complaints She's agreeable to labs today, will plan for Desmin Daleo break tomorrow and labs again on day of surgery   Objective: Vitals:   10/19/24 0400 10/19/24 0655 10/19/24 0710 10/19/24 0755  BP: (!) 88/56 (!) 94/58  (!) 102/58  Pulse: 81 83 85 86  Resp: (!) 24 10 17 20   Temp: 98.9 F (37.2 C)   99 F (37.2 C)  TempSrc: Oral   Oral  SpO2: 94% 90% 90% 90%  Weight:      Height:        Intake/Output Summary (Last 24 hours) at 10/19/2024 0952 Last data filed at 10/19/2024 0751 Gross per 24 hour  Intake 360 ml  Output 750 ml  Net -390 ml   Filed Weights   10/16/24 1320 10/16/24 1832 10/17/24 1449  Weight: 65.8 kg 54.8 kg 63.5 kg    Examination:  General: No acute distress. Looks Molly Savarino little better today, more alert/awake (she says she was tired of being poked/prodded yesterday) Cardiovascular: RRR Lungs: unlabored. Neurological: Alert and oriented 3. Moves all extremities 4 with equal strength. Cranial nerves II through XII grossly intact. Extremities: dressing to RLE, foul smelling with purulent discharge strike through the dressing - LLE with 5th ray amputation    Data Reviewed: I have personally reviewed following labs and imaging studies  CBC: Recent Labs  Lab 10/16/24 1632 10/16/24 2112 10/17/24 0723  WBC 34.2* 32.2* 28.1*   NEUTROABS 29.3*  --   --   HGB 10.9* 9.3* 8.1*  HCT 35.9*  30.1* 26.5*  MCV 78.6* 78.8* 79.6*  PLT 552* 523* 462*    Basic Metabolic Panel: Recent Labs  Lab 10/16/24 1632 10/16/24 2112 10/17/24 0253 10/17/24 0723 10/17/24 0920  NA 126* 130* 133* 130* 132*  K 3.9 3.2* 3.0* 3.2* 3.5  CL 80* 88* 96* 95* 97*  CO2 30 28 28 27 26   GLUCOSE 529* 286* 199* 182* 188*  BUN 20 15 11 9 8   CREATININE 0.79 0.68 0.48 0.50 0.44  CALCIUM 10.5* 9.2 8.3* 8.4* 8.1*  MG  --  1.4*  --   --  1.2*  PHOS  --  1.8*  --   --  1.5*    GFR: Estimated Creatinine Clearance: 72.3 mL/min (by C-G formula based on SCr of 0.44 mg/dL).  Liver Function Tests: Recent Labs  Lab 10/16/24 1632 10/17/24 0723  AST 37 77*  ALT 22 23  ALKPHOS 266* 218*  BILITOT 0.5 0.3  PROT 10.2* 6.8  ALBUMIN 3.1* 2.0*    CBG: Recent Labs  Lab 10/18/24 1628 10/18/24 1948 10/19/24 0126 10/19/24 0431 10/19/24 0903  GLUCAP 185* 204* 151* 84 96     Recent Results (from the past 240 hours)  Blood Culture (routine x 2)     Status: None (Preliminary result)   Collection Time: 10/16/24  4:25 PM   Specimen: BLOOD RIGHT ARM  Result Value Ref Range Status   Specimen Description   Final    BLOOD RIGHT ARM Performed at [facility], [address] [place], [place] [address]    Special Requests   Final    BOTTLES DRAWN AEROBIC AND ANAEROBIC Blood Culture adequate volume Performed at [facility], [address], [place], [place] [address]    Culture   Final    NO GROWTH 3 DAYS Performed at [facility], [address], [place], [place] [address]    Report Status PENDING  Incomplete  MRSA Next Gen by PCR, Nasal     Status: None   Collection Time: 10/16/24  6:51 PM   Specimen: Nasal Mucosa; Nasal Swab  Result Value Ref Range Status   MRSA by PCR Next Gen NOT DETECTED NOT DETECTED Final    Comment: (NOTE) The GeneXpert MRSA Assay (FDA approved for NASAL specimens only), is one  component of Jeffifer Rabold comprehensive MRSA colonization surveillance program. It is not intended to diagnose MRSA infection nor to guide or monitor treatment for MRSA infections. Test performance is not FDA approved in patients less than 80 years old. Performed at [facility], [address], [place], [place] [address]   Blood Culture (routine x 2)     Status: None (Preliminary result)   Collection Time: 10/16/24  9:12 PM   Specimen: BLOOD RIGHT HAND  Result Value Ref Range Status   Specimen Description   Final    BLOOD RIGHT HAND Performed at [facility], [address], [place], [place] [address]    Special Requests   Final    BOTTLES DRAWN AEROBIC AND ANAEROBIC Blood Culture results may not be optimal due to an inadequate volume of blood received in culture bottles Performed at [facility], [address], [place], [place] [address]    Culture   Final    NO GROWTH 3 DAYS Performed at [facility], [address] [place], [place] [address]    Report Status PENDING  Incomplete  Urine Culture (for pregnant, neutropenic or urologic patients or  patients with an indwelling urinary catheter)     Status: Abnormal   Collection Time: 10/16/24 11:40 PM   Specimen: Urine, Clean Catch  Result Value Ref Range Status   Specimen Description   Final    URINE, CLEAN CATCH Performed at [facility], [address], [place], [place] [address]    Special Requests   Final    NONE Performed at [facility], [address] [place], [place] [address]    Culture MULTIPLE SPECIES PRESENT, SUGGEST RECOLLECTION (Montague Corella)  Final   Report Status 10/18/2024 FINAL  Final  Gastrointestinal Panel by PCR , Stool     Status: Abnormal   Collection Time: 10/17/24  6:59 AM   Specimen: Stool  Result Value Ref Range Status   Campylobacter species NOT DETECTED NOT DETECTED Final   Plesimonas shigelloides NOT DETECTED NOT DETECTED Final    Salmonella species NOT DETECTED NOT DETECTED Final   Yersinia enterocolitica NOT DETECTED NOT DETECTED Final   Vibrio species NOT DETECTED NOT DETECTED Final   Vibrio cholerae NOT DETECTED NOT DETECTED Final   Enteroaggregative E coli (EAEC) NOT DETECTED NOT DETECTED Final   Enteropathogenic E coli (EPEC) DETECTED (Nataliyah Packham) NOT DETECTED Final    Comment: RESULT CALLED TO, READ BACK BY AND VERIFIED WITH: MICHELLE B., RN AT 1648 10/17/24 RAM    Enterotoxigenic E coli (ETEC) NOT DETECTED NOT DETECTED Final   Shiga like toxin producing E coli (STEC) NOT DETECTED NOT DETECTED Final   Shigella/Enteroinvasive E coli (EIEC) NOT DETECTED NOT DETECTED Final   Cryptosporidium NOT DETECTED NOT DETECTED Final   Cyclospora cayetanensis NOT DETECTED NOT DETECTED Final   Entamoeba histolytica NOT DETECTED NOT DETECTED Final   Giardia lamblia NOT DETECTED NOT DETECTED Final   Adenovirus F40/41 NOT DETECTED NOT DETECTED Final   Astrovirus NOT DETECTED NOT DETECTED Final   Norovirus GI/GII NOT DETECTED NOT DETECTED Final   Rotavirus Milon Dethloff NOT DETECTED NOT DETECTED Final   Sapovirus (I, II, IV, and V) NOT DETECTED NOT DETECTED Final    Comment: Performed at [facility], [address] [place], [place] [address]  C Difficile Quick Screen w PCR reflex     Status: None   Collection Time: 10/18/24  3:43 PM   Specimen: STOOL  Result Value Ref Range Status   C Diff antigen NEGATIVE NEGATIVE Final   C Diff toxin NEGATIVE NEGATIVE Final   C Diff interpretation No C. difficile detected.  Final    Comment: Performed at [facility], [address] [place], [place] [address]         Radiology Studies: MR FOOT RIGHT WO CONTRAST Result Date: 10/18/2024 EXAM: MRI of the right Foot without contrast. 10/17/2024 10:13:00 PM TECHNIQUE: Multiplanar multisequence MRI of the right foot was performed from the level of the Lisfranc joint through the toes without the administration of intravenous contrast.  COMPARISON: Radiographs 10/16/2024. CLINICAL HISTORY: Osteomyelitis, Right foot. LIMITATIONS/ARTIFACTS: Motion artifact is present, reducing diagnostic sensitivity and specificity. FINDINGS: LISFRANC JOINT: Visualized Lisfranc ligament is intact. BONE MARROW: Abnormal signal compatible with osteomyelitis involving the 5th metatarsal, 4th metatarsal head, and proximal phalanges of the 4th and 5th toes. Associated abnormal gas density is present in the proximal phalanges and in the middle and distal phalanges of the small toe, compatible with Ryhanna Dunsmore gas-forming organism and osteomyelitis. GREATER AND LESSER MTP JOINTS: No significant joint effusion or osseous erosions. No significant degenerative changes. Normal alignment. SOFT TISSUES: Extensive gas tracts are present  in the soft tissues of the dorsum of the foot, especially laterally, compatible with cellulitis with Seneca Gadbois gas-producing organism. Abnormal gas densities also track along the plantar fascia, flexor digitorum longus, and flexor digitorum longus musculature of the foot, compatible with infectious myositis and tenosynovitis. Cutaneous irregularity is noted along the dorsum of the foot, suggesting ulceration. Circumferential subcutaneous edema is present in the forefoot and tracking into the toes. Generalized muscle edema may be due to neurogenic causes or infectious myositis. TENDONS: Abnormal gas densities track along the plantar fascia, flexor digitorum longus, and flexor digitorum longus musculature of the foot, compatible with infectious myositis and tenosynovitis. IMPRESSION: 1. Osteomyelitis involving the 5th metatarsal, 4th metatarsal head, and proximal phalanges of the 4th and 5th toes with associated gas, in the middle and distal phalanges of the 5th toe compatible with Dailen Mcclish gas-forming organism. 2. Extensive gas in the dorsal soft tissues, especially laterally, compatible with cellulitis due to Anjelica Gorniak gas-producing organism. Possible associated ulceration/slash  blistering. 3. Gas tracking along the plantar fascia and flexor digitorum longus tendon and musculature, compatible with infectious myositis and tenosynovitis. 4. Circumferential subcutaneous edema of the forefoot with extension into the toes. 5. Generalized muscle edema, which may reflect neurogenic change or infectious myositis. 6. Motion artifact reduces diagnostic sensitivity and specificity. Electronically signed by: Ryan Salvage MD 10/18/2024 10:43 AM EST RP Workstation: [id number]   ECHOCARDIOGRAM COMPLETE Result Date: 10/17/2024    ECHOCARDIOGRAM REPORT   Patient Name:   SAMARIYA ROCKHOLD Date of Exam: 10/17/2024 Medical Rec #:  [id number]       Height:       60.0 in Accession #:    [id number]      Weight:       120.8 lb Date of Birth:  Dec 22, 1975       BSA:          1.507 m Patient Age:    48 years        BP:           91/42 mmHg Patient Gender: F               HR:           76 bpm. Exam Location:  Inpatient Procedure: 2D Echo, Cardiac Doppler and Color Doppler (Both Spectral and Color            Flow Doppler were utilized during procedure). Indications:    Bacteremia R78.81  History:        Patient has no prior history of Echocardiogram examinations. Hx                 of CVA, Signs/Symptoms:Bacteremia; Risk Factors:Diabetes,                 Hypertension, Dyslipidemia and Sleep Apnea.  Sonographer:    Koleen Popper RDCS Referring Phys: [id number] ALEJANDRO LATIF [facility] IMPRESSIONS  1. Left ventricular ejection fraction, by estimation, is 60 to 65%. The left ventricle has normal function. The left ventricle has no regional wall motion abnormalities. Left ventricular diastolic parameters were normal.  2. Right ventricular systolic function is normal. The right ventricular size is normal. There is normal pulmonary artery systolic pressure.  3. The mitral valve is normal in structure. No evidence of mitral valve regurgitation. No evidence of mitral stenosis.  4. The aortic valve has an indeterminant number of  cusps. Aortic valve regurgitation is not visualized. No aortic stenosis is present.  5. The inferior vena cava is dilated in size with >50%  respiratory variability, suggesting right atrial pressure of 8 mmHg. Comparison(s): No prior Echocardiogram. Conclusion(s)/Recommendation(s): No evidence of valvular vegetations on this transthoracic echocardiogram. Consider Marthena Whitmyer transesophageal echocardiogram to exclude infective endocarditis if clinically indicated. FINDINGS  Left Ventricle: Left ventricular ejection fraction, by estimation, is 60 to 65%. The left ventricle has normal function. The left ventricle has no regional wall motion abnormalities. Strain was performed and the global longitudinal strain is indeterminate. The left ventricular internal cavity size was normal in size. There is no left ventricular hypertrophy. Left ventricular diastolic parameters were normal. Right Ventricle: The right ventricular size is normal. No increase in right ventricular wall thickness. Right ventricular systolic function is normal. There is normal pulmonary artery systolic pressure. The tricuspid regurgitant velocity is 2.46 m/s, and  with an assumed right atrial pressure of 8 mmHg, the estimated right ventricular systolic pressure is 32.2 mmHg. Left Atrium: Left atrial size was normal in size. Right Atrium: Right atrial size was normal in size. Pericardium: There is no evidence of pericardial effusion. Mitral Valve: The mitral valve is normal in structure. No evidence of mitral valve regurgitation. No evidence of mitral valve stenosis. Tricuspid Valve: The tricuspid valve is normal in structure. Tricuspid valve regurgitation is trivial. No evidence of tricuspid stenosis. Aortic Valve: The aortic valve has an indeterminant number of cusps. Aortic valve regurgitation is not visualized. No aortic stenosis is present. Pulmonic Valve: The pulmonic valve was grossly normal. Pulmonic valve regurgitation is not visualized. No evidence of  pulmonic stenosis. Aorta: The aortic root and ascending aorta are structurally normal, with no evidence of dilitation. Venous: The inferior vena cava is dilated in size with greater than 50% respiratory variability, suggesting right atrial pressure of 8 mmHg. IAS/Shunts: No atrial level shunt detected by color flow Doppler. Additional Comments: 3D was performed not requiring image post processing on an independent workstation and was indeterminate.  LEFT VENTRICLE PLAX 2D LVIDd:         4.50 cm   Diastology LVIDs:         2.80 cm   LV e' medial:    11.10 cm/s LV PW:         0.70 cm   LV E/e' medial:  11.2 LV IVS:        0.90 cm   LV e' lateral:   12.70 cm/s LVOT diam:     1.80 cm   LV E/e' lateral: 9.8 LV SV:         53 LV SV Index:   35 LVOT Area:     2.54 cm  RIGHT VENTRICLE             IVC RV Basal diam:  3.40 cm     IVC diam: 2.50 cm RV S prime:     12.80 cm/s TAPSE (M-mode): 3.2 cm LEFT ATRIUM             Index        RIGHT ATRIUM          Index LA diam:        3.10 cm 2.06 cm/m   RA Area:     8.54 cm LA Vol (A2C):   23.1 ml 15.33 ml/m  RA Volume:   14.00 ml 9.29 ml/m LA Vol (A4C):   27.3 ml 18.12 ml/m LA Biplane Vol: 25.7 ml 17.06 ml/m  AORTIC VALVE LVOT Vmax:   121.00 cm/s LVOT Vmean:  79.000 cm/s LVOT VTI:    0.210 m  AORTA Ao Root diam:  2.30 cm Ao Asc diam:  2.50 cm MITRAL VALVE                TRICUSPID VALVE MV Area (PHT): 4.63 cm     TR Peak grad:   24.2 mmHg MV Decel Time: 164 msec     TR Vmax:        246.00 cm/s MV E velocity: 124.00 cm/s MV Hezikiah Retzloff velocity: 67.90 cm/s   SHUNTS MV E/Briston Lax ratio:  1.83         Systemic VTI:  0.21 m                             Systemic Diam: 1.80 cm Vishnu Priya Mallipeddi Electronically signed by Diannah Late Mallipeddi Signature Date/Time: 10/17/2024/1:19:24 PM    Final    CT CERVICAL SPINE W CONTRAST Result Date: 10/17/2024 EXAM: CT CERVICAL SPINE WITH CONTRAST 10/17/2024 11:54:53 AM TECHNIQUE: CT of the cervical spine was performed with the administration of 100 mL  of iohexol  (OMNIPAQUE ) 300 MG/ML solution. Multiplanar reformatted images are provided for review. Automated exposure control, iterative reconstruction, and/or weight based adjustment of the mA/kV was utilized to reduce the radiation dose to as low as reasonably achievable. COMPARISON: None available. CLINICAL HISTORY: Neck pain, infection suspected, no prior imaging. FINDINGS: CERVICAL SPINE: BONES AND ALIGNMENT: No acute fracture or traumatic malalignment. DEGENERATIVE CHANGES: No significant degenerative changes. SOFT TISSUES: Limited soft tissue imaging on the CT of the cervical spine. There is mild subcutaneous fat stranding within the posterior right neck. No abscess or fluid collection. Anterior neck is excluded from view. IMPRESSION: 1. Mild subcutaneous fat stranding within the posterior right neck without abscess or fluid collection. 2. Anterior neck not evaluated on this study due to exclusion from the field of view. 3. No acute abnormality of the cervical spine. Electronically signed by: Franky Stanford MD 10/17/2024 12:06 PM EDT RP Workstation: [id number]   CT MAXILLOFACIAL W CONTRAST Result Date: 10/17/2024 EXAM: CT Face with contrast 10/17/2024 11:54:53 AM TECHNIQUE: CT of the face was performed with the administration of 100 mL of iohexol  (OMNIPAQUE ) 300 MG/ML solution. Multiplanar reformatted images are provided for review. Automated exposure control, iterative reconstruction, and/or weight based adjustment of the mA/kV was utilized to reduce the radiation dose to as low as reasonably achievable. COMPARISON: None available CLINICAL HISTORY: Facial Cellulitis FINDINGS: AERODIGESTIVE TRACT: No mass. No edema. SALIVARY GLANDS: No acute abnormality. LYMPH NODES: No suspicious cervical lymphadenopathy. SOFT TISSUES: Subcutaneous fat stranding is present throughout much of the facial soft tissues, worse in the right lower face. No abscess or drainable fluid collection. No clear infectious source. BRAIN,  ORBITS AND SINUSES: No acute abnormality. BONES: No acute abnormality. No suspicious bone lesion. IMPRESSION: 1. Subcutaneous fat stranding throughout much of the facial soft tissues, worst in the right lower face, consistent with facial cellulitis. 2. No abscess or drainable fluid collection. 3. No clear infectious source identified. Electronically signed by: Franky Stanford MD 10/17/2024 12:03 PM EDT RP Workstation: [id number]        Scheduled Meds:  Chlorhexidine Gluconate Cloth  6 each Topical Daily   collagenase   Topical Daily   enoxaparin  (LOVENOX ) injection  40 mg Subcutaneous Q24H   feeding supplement  237 mL Oral BID BM   gabapentin  800 mg Oral TID   hydrOXYzine   50 mg Oral BID   insulin  aspart  0-9 Units Subcutaneous Q4H   insulin  glargine-yfgn  15 Units Subcutaneous Daily  midodrine  2.5 mg Oral TID WC   nystatin  5 mL Oral QID   pantoprazole   40 mg Oral Daily   Continuous Infusions:  ampicillin-sulbactam (UNASYN) IV 3 g (10/19/24 0923)   linezolid (ZYVOX) IV 600 mg (10/19/24 0433)   vancomycin 1,250 mg (10/18/24 1920)     LOS: 3 days    Time spent: over 30 min     Meliton Monte, MD [facility]   To contact the attending provider between 7A-7P or the covering provider during after hours 7P-7A, please log into the web site [email or web address] and access using universal [place] password for that web site. If you do not have the password, please call the hospital operator.  10/19/2024, 9:52 AM

## 2024-10-19 NOTE — H&P (View-Only) (Signed)
 ORTHOPAEDIC CONSULTATION  REQUESTING PHYSICIAN: Perri DELENA Meliton Mickey., *  Chief Complaint: Gangrenous changes right foot  HPI: Phyllis Pittman is a 48 y.o. female who presents with extensive gangrenous changes right foot.  Patient states she is had these changes for several weeks.  Patient with a history of uncontrolled type 2 diabetes.  Past Medical History:  Diagnosis Date   Anxiety    Cancer (HCC)    neoplasm of cervix   CVA (cerebral vascular accident) (HCC)    hx of   Depression with anxiety    Diabetes mellitus without complication (HCC)    type 2   Female hirsutism    Fibromyalgia    GERD without esophagitis    Hyperlipidemia    Hypertension    IBS (irritable bowel syndrome)    Long term (current) use of insulin  (HCC)    Migraine    Myopia of both eyes    Neuropathy    feet   Neuropathy    OSA (obstructive sleep apnea)    Sciatica    Shingles    Past Surgical History:  Procedure Laterality Date   CESAREAN SECTION     05/2001, 07/2012   CHOLECYSTECTOMY  10/1999   FACIAL COSMETIC SURGERY     FOOT SURGERY     Social History   Socioeconomic History   Marital status: Significant Other    Spouse name: Not on file   Number of children: 3   Years of education: Not on file   Highest education level: Not on file  Occupational History    Comment: home maker  Tobacco Use   Smoking status: Every Day    Current packs/day: 0.25    Types: Cigarettes   Smokeless tobacco: Never   Tobacco comments:    5 or less a day  Vaping Use   Vaping status: Never Used  Substance and Sexual Activity   Alcohol use: Yes    Comment: occ   Drug use: No   Sexual activity: Not on file  Other Topics Concern   Not on file  Social History Narrative   Lives with partner   Social Drivers of Health   Financial Resource Strain: Not on file  Food Insecurity: Patient Declined (10/18/2024)   Hunger Vital Sign    Worried About Running Out of Food in the Last Year: Patient  declined    Ran Out of Food in the Last Year: Patient declined  Transportation Needs: Unknown (10/18/2024)   PRAPARE - Administrator, Civil Service (Medical): Patient declined    Lack of Transportation (Non-Medical): No  Physical Activity: Not on file  Stress: Not on file  Social Connections: Unknown (04/20/2022)   Received from [facility]   Social Network    Social Network: Not on file   Family History  Problem Relation Age of Onset   COPD Mother    Diabetes Mother    Cancer Mother    Diabetes Father    Cancer Father    - negative except otherwise stated in the family history section Allergies  Allergen Reactions   Acyclovir Itching and Nausea Only   Dust Mite Extract Anaphylaxis, Hives, Shortness Of Breath, Itching, Swelling, Anxiety, Dermatitis and Rash   Molds & Smuts Anaphylaxis, Hives, Shortness Of Breath, Itching, Nausea Only, Swelling, Anxiety and Rash   Other Hives and Other (See Comments)    Dust, mold, powder   Prednisone Other (See Comments)    Hyperglycemia    Venlafaxine  Shortness Of Breath and Other (See Comments)    Chest discomfort, too   Cephalexin  Nausea Only and Other (See Comments)    Tolerates via IV - not pills   Dulaglutide Nausea And Vomiting and Other (See Comments)    TRULICITY   Latex Dermatitis, Rash and Other (See Comments)    Powder only   Levofloxacin Nausea And Vomiting and Other (See Comments)    Can receive only via IV   Metformin Nausea And Vomiting   Semaglutide Other (See Comments)    Made her really sick (Ozempic or Wegovy)   Sulfa Antibiotics Nausea Only and Other (See Comments)    Stomach upset   Codeine Other (See Comments)    Unknown reaction   Famotidine Other (See Comments)    Reaction not cited   Prior to Admission medications   Medication Sig Start Date End Date Taking? Authorizing Provider  cetirizine (ZYRTEC) 10 MG tablet Take 10 mg by mouth daily.   Yes [provider]  gabapentin  (NEURONTIN) 800 MG tablet Take 800 mg by mouth in the morning, at noon, and at bedtime.   Yes [provider]  hydrOXYzine  (ATARAX ) 50 MG tablet Take 50 mg by mouth in the morning and at bedtime.   Yes [provider]  insulin  lispro (HUMALOG) 100 UNIT/ML injection Inject 3-5 Units into the skin 3 (three) times daily with meals.   Yes [provider]  ketoconazole (NIZORAL) 2 % cream Apply 1 Application topically See admin instructions. Apply to affected area 2 times a day   Yes [provider]  loperamide (IMODIUM) 2 MG capsule Take 2 mg by mouth at bedtime as needed for diarrhea or loose stools.   Yes [provider]  naproxen sodium (ALEVE) 220 MG tablet Take 440 mg by mouth at bedtime.   Yes [provider]  omeprazole (PRILOSEC) 40 MG capsule Take 40 mg by mouth at bedtime.   Yes [provider]  ondansetron  (ZOFRAN -ODT) 4 MG disintegrating tablet Take 4 mg by mouth every 6 (six) hours as needed for nausea or vomiting (dissolve orally).   Yes [provider]  oxyCODONE-acetaminophen  (PERCOCET) 10-325 MG tablet Take 1 tablet by mouth 4 (four) times daily.   Yes [provider]  rOPINIRole (REQUIP) 0.25 MG tablet Take 0.25 mg by mouth at bedtime.   Yes [provider]  tiZANidine (ZANAFLEX) 4 MG tablet Take 4 mg by mouth at bedtime.   Yes [provider]  traZODone (DESYREL) 50 MG tablet Take 50-100 mg by mouth at bedtime.   Yes [provider]  TRESIBA FLEXTOUCH 100 UNIT/ML FlexTouch Pen Inject 16 Units into the skin at bedtime.   Yes [provider]  ALPRAZolam  (XANAX ) 0.5 MG tablet Take 1 tablet (0.5 mg total) by mouth at bedtime as needed for anxiety. Patient not taking: Reported on 10/16/2024 07/13/21   Patt Alm Macho, MD  cyclobenzaprine  (FLEXERIL ) 5 MG tablet Take 1 tablet (5 mg total) by mouth 3 (three) times daily as needed for muscle spasms. Patient not taking: Reported  on 10/16/2024 07/13/21   Patt Alm Macho, MD  Insulin  Glargine (BASAGLAR  [facility]) 100 UNIT/ML Inject 30 Units into the skin at bedtime. Patient not taking: Reported on 10/16/2024 08/14/21 10/16/24  Rojelio Nest, DO  ondansetron  (ZOFRAN  ODT) 8 MG disintegrating tablet Take 1 tablet (8 mg total) by mouth every 8 (eight) hours as needed for nausea or vomiting. Patient not taking: Reported on 10/16/2024 02/22/18   Kirichenko,  Davied, PA-C   MR FOOT RIGHT WO CONTRAST Result Date: 10/18/2024 EXAM: MRI of the right Foot without contrast. 10/17/2024 10:13:00 PM TECHNIQUE: Multiplanar multisequence MRI of the right foot was performed from the level of the Lisfranc joint through the toes without the administration of intravenous contrast. COMPARISON: Radiographs 10/16/2024. CLINICAL HISTORY: Osteomyelitis, Right foot. LIMITATIONS/ARTIFACTS: Motion artifact is present, reducing diagnostic sensitivity and specificity. FINDINGS: LISFRANC JOINT: Visualized Lisfranc ligament is intact. BONE MARROW: Abnormal signal compatible with osteomyelitis involving the 5th metatarsal, 4th metatarsal head, and proximal phalanges of the 4th and 5th toes. Associated abnormal gas density is present in the proximal phalanges and in the middle and distal phalanges of the small toe, compatible with a gas-forming organism and osteomyelitis. GREATER AND LESSER MTP JOINTS: No significant joint effusion or osseous erosions. No significant degenerative changes. Normal alignment. SOFT TISSUES: Extensive gas tracts are present in the soft tissues of the dorsum of the foot, especially laterally, compatible with cellulitis with a gas-producing organism. Abnormal gas densities also track along the plantar fascia, flexor digitorum longus, and flexor digitorum longus musculature of the foot, compatible with infectious myositis and tenosynovitis. Cutaneous irregularity is noted along the dorsum of the foot, suggesting ulceration. Circumferential  subcutaneous edema is present in the forefoot and tracking into the toes. Generalized muscle edema may be due to neurogenic causes or infectious myositis. TENDONS: Abnormal gas densities track along the plantar fascia, flexor digitorum longus, and flexor digitorum longus musculature of the foot, compatible with infectious myositis and tenosynovitis. IMPRESSION: 1. 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 2. Extensive gas in the dorsal soft tissues, especially laterally, compatible with cellulitis due to a gas-producing organism. Possible associated ulceration/slash blistering. 3. Gas tracking along the plantar fascia and flexor digitorum longus tendon and musculature, compatible with infectious myositis and tenosynovitis. 4. Circumferential subcutaneous edema of the forefoot with extension into the toes. 5. Generalized muscle edema, which may reflect neurogenic change or infectious myositis. 6. Motion artifact reduces diagnostic sensitivity and specificity. Electronically signed by: Ryan Salvage MD 10/18/2024 10:43 AM EST RP Workstation: [id number]   ECHOCARDIOGRAM COMPLETE Result Date: 10/17/2024    ECHOCARDIOGRAM REPORT   Patient Name:   RAEVEN PINT Date of Exam: 10/17/2024 Medical Rec #:  [id number]       Height:       60.0 in Accession #:    [id number]      Weight:       120.8 lb Date of Birth:  [birth year]       BSA:          1.507 m Patient Age:    48 years        BP:           91/42 mmHg Patient Gender: F               HR:           76 bpm. Exam Location:  Inpatient Procedure: 2D Echo, Cardiac Doppler and Color Doppler (Both Spectral and Color            Flow Doppler were utilized during procedure). Indications:    Bacteremia R78.81  History:        Patient has no prior history of Echocardiogram examinations. Hx  of CVA, Signs/Symptoms:Bacteremia; Risk Factors:Diabetes,                 Hypertension, Dyslipidemia and Sleep Apnea.  Sonographer:    Koleen Popper RDCS Referring Phys: [id number] ALEJANDRO LATIF [facility] IMPRESSIONS  1. Left ventricular ejection fraction, by estimation, is 60 to 65%. The left ventricle has normal function. The left ventricle has no regional wall motion abnormalities. Left ventricular diastolic parameters were normal.  2. Right ventricular systolic function is normal. The right ventricular size is normal. There is normal pulmonary artery systolic pressure.  3. The mitral valve is normal in structure. No evidence of mitral valve regurgitation. No evidence of mitral stenosis.  4. The aortic valve has an indeterminant number of cusps. Aortic valve regurgitation is not visualized. No aortic stenosis is present.  5. The inferior vena cava is dilated in size with >50% respiratory variability, suggesting right atrial pressure of 8 mmHg. Comparison(s): No prior Echocardiogram. Conclusion(s)/Recommendation(s): No evidence of valvular vegetations on this transthoracic echocardiogram. Consider a transesophageal echocardiogram to exclude infective endocarditis if clinically indicated. FINDINGS  Left Ventricle: Left ventricular ejection fraction, by estimation, is 60 to 65%. The left ventricle has normal function. The left ventricle has no regional wall motion abnormalities. Strain was performed and the global longitudinal strain is indeterminate. The left ventricular internal cavity size was normal in size. There is no left ventricular hypertrophy. Left ventricular diastolic parameters were normal. Right Ventricle: The right ventricular size is normal. No increase in right ventricular wall thickness. Right ventricular systolic function is normal. There is normal pulmonary artery systolic pressure. The tricuspid regurgitant velocity is 2.46 m/s, and  with an assumed right atrial pressure of 8 mmHg, the estimated  right ventricular systolic pressure is 32.2 mmHg. Left Atrium: Left atrial size was normal in size. Right Atrium: Right atrial size was normal in size. Pericardium: There is no evidence of pericardial effusion. Mitral Valve: The mitral valve is normal in structure. No evidence of mitral valve regurgitation. No evidence of mitral valve stenosis. Tricuspid Valve: The tricuspid valve is normal in structure. Tricuspid valve regurgitation is trivial. No evidence of tricuspid stenosis. Aortic Valve: The aortic valve has an indeterminant number of cusps. Aortic valve regurgitation is not visualized. No aortic stenosis is present. Pulmonic Valve: The pulmonic valve was grossly normal. Pulmonic valve regurgitation is not visualized. No evidence of pulmonic stenosis. Aorta: The aortic root and ascending aorta are structurally normal, with no evidence of dilitation. Venous: The inferior vena cava is dilated in size with greater than 50% respiratory variability, suggesting right atrial pressure of 8 mmHg. IAS/Shunts: No atrial level shunt detected by color flow Doppler. Additional Comments: 3D was performed not requiring image post processing on an independent workstation and was indeterminate.  LEFT VENTRICLE PLAX 2D LVIDd:         4.50 cm   Diastology LVIDs:         2.80 cm   LV e' medial:    11.10 cm/s LV PW:         0.70 cm   LV E/e' medial:  11.2 LV IVS:        0.90 cm   LV e' lateral:   12.70 cm/s LVOT diam:     1.80 cm   LV E/e' lateral: 9.8 LV SV:         53 LV SV Index:   35 LVOT Area:     2.54 cm  RIGHT VENTRICLE  IVC RV Basal diam:  3.40 cm     IVC diam: 2.50 cm RV S prime:     12.80 cm/s TAPSE (M-mode): 3.2 cm LEFT ATRIUM             Index        RIGHT ATRIUM          Index LA diam:        3.10 cm 2.06 cm/m   RA Area:     8.54 cm LA Vol (A2C):   23.1 ml 15.33 ml/m  RA Volume:   14.00 ml 9.29 ml/m LA Vol (A4C):   27.3 ml 18.12 ml/m LA Biplane Vol: 25.7 ml 17.06 ml/m  AORTIC VALVE LVOT Vmax:   121.00  cm/s LVOT Vmean:  79.000 cm/s LVOT VTI:    0.210 m  AORTA Ao Root diam: 2.30 cm Ao Asc diam:  2.50 cm MITRAL VALVE                TRICUSPID VALVE MV Area (PHT): 4.63 cm     TR Peak grad:   24.2 mmHg MV Decel Time: 164 msec     TR Vmax:        246.00 cm/s MV E velocity: 124.00 cm/s MV A velocity: 67.90 cm/s   SHUNTS MV E/A ratio:  1.83         Systemic VTI:  0.21 m                             Systemic Diam: 1.80 cm Vishnu Priya Mallipeddi Electronically signed by Diannah Late Mallipeddi Signature Date/Time: 10/17/2024/1:19:24 PM    Final    CT CERVICAL SPINE W CONTRAST Result Date: 10/17/2024 EXAM: CT CERVICAL SPINE WITH CONTRAST 10/17/2024 11:54:53 AM TECHNIQUE: CT of the cervical spine was performed with the administration of 100 mL of iohexol  (OMNIPAQUE ) 300 MG/ML solution. Multiplanar reformatted images are provided for review. Automated exposure control, iterative reconstruction, and/or weight based adjustment of the mA/kV was utilized to reduce the radiation dose to as low as reasonably achievable. COMPARISON: None available. CLINICAL HISTORY: Neck pain, infection suspected, no prior imaging. FINDINGS: CERVICAL SPINE: BONES AND ALIGNMENT: No acute fracture or traumatic malalignment. DEGENERATIVE CHANGES: No significant degenerative changes. SOFT TISSUES: Limited soft tissue imaging on the CT of the cervical spine. There is mild subcutaneous fat stranding within the posterior right neck. No abscess or fluid collection. Anterior neck is excluded from view. IMPRESSION: 1. Mild subcutaneous fat stranding within the posterior right neck without abscess or fluid collection. 2. Anterior neck not evaluated on this study due to exclusion from the field of view. 3. No acute abnormality of the cervical spine. Electronically signed by: Franky Stanford MD 10/17/2024 12:06 PM EDT RP Workstation: [id number]   CT MAXILLOFACIAL W CONTRAST Result Date: 10/17/2024 EXAM: CT Face with contrast 10/17/2024 11:54:53 AM TECHNIQUE:  CT of the face was performed with the administration of 100 mL of iohexol  (OMNIPAQUE ) 300 MG/ML solution. Multiplanar reformatted images are provided for review. Automated exposure control, iterative reconstruction, and/or weight based adjustment of the mA/kV was utilized to reduce the radiation dose to as low as reasonably achievable. COMPARISON: None available CLINICAL HISTORY: Facial Cellulitis FINDINGS: AERODIGESTIVE TRACT: No mass. No edema. SALIVARY GLANDS: No acute abnormality. LYMPH NODES: No suspicious cervical lymphadenopathy. SOFT TISSUES: Subcutaneous fat stranding is present throughout much of the facial soft tissues, worse in the right lower face. No abscess or drainable fluid collection. No  clear infectious source. BRAIN, ORBITS AND SINUSES: No acute abnormality. BONES: No acute abnormality. No suspicious bone lesion. IMPRESSION: 1. Subcutaneous fat stranding throughout much of the facial soft tissues, worst in the right lower face, consistent with facial cellulitis. 2. No abscess or drainable fluid collection. 3. No clear infectious source identified. Electronically signed by: Franky Stanford MD 10/17/2024 12:03 PM EDT RP Workstation: [id number]   - pertinent xrays, CT, MRI studies were reviewed and independently interpreted  Positive ROS: All other systems have been reviewed and were otherwise negative with the exception of those mentioned in the HPI and as above.  Physical Exam: General: Alert, no acute distress Psychiatric: Patient is competent for consent with normal mood and affect Lymphatic: No axillary or cervical lymphadenopathy Cardiovascular: No pedal edema Respiratory: No cyanosis, no use of accessory musculature GI: No organomegaly, abdomen is soft and non-tender    Images:  @ENCIMAGES @  Labs:  Lab Results  Component Value Date   HGBA1C 12.8 (H) 08/13/2021   ESRSEDRATE 136 (H) 10/16/2024   CRP 34.6 (H) 10/16/2024   REPTSTATUS 10/18/2024 FINAL 10/16/2024    GRAMSTAIN NO WBC SEEN NO ORGANISMS SEEN CYTOSPIN SMEAR  08/12/2021   GRAMSTAIN  08/12/2021    NO SQUAMOUS EPITHELIAL CELLS SEEN NO WBC SEEN NO ORGANISMS SEEN Performed at [facility], [address], [place], [place] [address]    CULT MULTIPLE SPECIES PRESENT, SUGGEST RECOLLECTION (A) 10/16/2024    Lab Results  Component Value Date   ALBUMIN 2.0 (L) 10/17/2024   ALBUMIN 3.1 (L) 10/16/2024   ALBUMIN 4.0 08/11/2021        Latest Ref Rng & Units 10/17/2024    7:23 AM 10/16/2024    9:12 PM 10/16/2024    4:32 PM  CBC EXTENDED  WBC 4.0 - 10.5 K/uL 28.1  32.2  34.2   RBC 3.87 - 5.11 MIL/uL 3.33  3.82  4.57   Hemoglobin 12.0 - 15.0 g/dL 8.1  9.3  89.0   HCT 63.9 - 46.0 % 26.5  30.1  35.9   Platelets 150 - 400 K/uL 462  523  552   NEUT# 1.7 - 7.7 K/uL   29.3   Lymph# 0.7 - 4.0 K/uL   1.9     Neurologic: Patient does not have protective sensation bilateral lower extremities.   MUSCULOSKELETAL:   Skin: Examination almost the entire dorsum of the right foot is black and gangrenous.  There is exposed bone on the 4th and 5th toes with necrotic gangrenous changes.  MRI scan shows extensive cellulitis abscess and osteomyelitis involving the right foot.  White cell count 28.1 with a hemoglobin of 8.1.  Albumin 2.0.  Hemoglobin A1c 12.8 with a C-reactive protein of 34.6.  Sed rate 136.  LRINEC score 11.  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  Plan: Plan: Will plan for a right below-knee amputation on Wednesday.  Risks and benefits of surgery were discussed including need for additional surgery.  Patient states she understands wished to proceed at this time.  Thank you for the consult and the opportunity to see Phyllis Pittman  Jerona Sage, MD [facility] [phone] 7:41 AM

## 2024-10-19 NOTE — Plan of Care (Signed)
 Problem: Education: Goal: Ability to describe self-care measures that may prevent or decrease complications (Diabetes Survival Skills Education) will improve Outcome: Progressing Goal: Individualized Educational Video(s) Outcome: Progressing   Problem: Coping: Goal: Ability to adjust to condition or change in health will improve Outcome: Progressing   Problem: Fluid Volume: Goal: Ability to maintain a balanced intake and output will improve Outcome: Progressing   Problem: Health Behavior/Discharge Planning: Goal: Ability to identify and utilize available resources and services will improve Outcome: Progressing Goal: Ability to manage health-related needs will improve Outcome: Progressing   Problem: Metabolic: Goal: Ability to maintain appropriate glucose levels will improve Outcome: Progressing   Problem: Nutritional: Goal: Maintenance of adequate nutrition will improve Outcome: Progressing Goal: Progress toward achieving an optimal weight will improve Outcome: Progressing   Problem: Skin Integrity: Goal: Risk for impaired skin integrity will decrease Outcome: Progressing   Problem: Tissue Perfusion: Goal: Adequacy of tissue perfusion will improve Outcome: Progressing   Problem: Education: Goal: Ability to describe self-care measures that may prevent or decrease complications (Diabetes Survival Skills Education) will improve Outcome: Progressing Goal: Individualized Educational Video(s) Outcome: Progressing   Problem: Cardiac: Goal: Ability to maintain an adequate cardiac output will improve Outcome: Progressing   Problem: Health Behavior/Discharge Planning: Goal: Ability to identify and utilize available resources and services will improve Outcome: Progressing Goal: Ability to manage health-related needs will improve Outcome: Progressing   Problem: Fluid Volume: Goal: Ability to achieve a balanced intake and output will improve Outcome: Progressing    Problem: Metabolic: Goal: Ability to maintain appropriate glucose levels will improve Outcome: Progressing   Problem: Nutritional: Goal: Maintenance of adequate nutrition will improve Outcome: Progressing Goal: Maintenance of adequate weight for body size and type will improve Outcome: Progressing   Problem: Respiratory: Goal: Will regain and/or maintain adequate ventilation Outcome: Progressing   Problem: Urinary Elimination: Goal: Ability to achieve and maintain adequate renal perfusion and functioning will improve Outcome: Progressing   Problem: Clinical Measurements: Goal: Ability to avoid or minimize complications of infection will improve Outcome: Progressing   Problem: Skin Integrity: Goal: Skin integrity will improve Outcome: Progressing   Problem: Education: Goal: Knowledge of General Education information will improve Description: Including pain rating scale, medication(s)/side effects and non-pharmacologic comfort measures Outcome: Progressing   Problem: Health Behavior/Discharge Planning: Goal: Ability to manage health-related needs will improve Outcome: Progressing   Problem: Clinical Measurements: Goal: Ability to maintain clinical measurements within normal limits will improve Outcome: Progressing Goal: Will remain free from infection Outcome: Progressing Goal: Diagnostic test results will improve Outcome: Progressing Goal: Respiratory complications will improve Outcome: Progressing Goal: Cardiovascular complication will be avoided Outcome: Progressing   Problem: Activity: Goal: Risk for activity intolerance will decrease Outcome: Progressing   Problem: Nutrition: Goal: Adequate nutrition will be maintained Outcome: Progressing   Problem: Coping: Goal: Level of anxiety will decrease Outcome: Progressing   Problem: Elimination: Goal: Will not experience complications related to bowel motility Outcome: Progressing Goal: Will not experience  complications related to urinary retention Outcome: Progressing   Problem: Pain Managment: Goal: General experience of comfort will improve and/or be controlled Outcome: Progressing   Problem: Safety: Goal: Ability to remain free from injury will improve Outcome: Progressing   Problem: Skin Integrity: Goal: Risk for impaired skin integrity will decrease Outcome: Progressing   Problem: Nutrition Goal: Patient maintains adequate hydration Outcome: Progressing Goal: Patient maintains weight Outcome: Progressing Goal: Patient/Family demonstrates understanding of diet Outcome: Progressing Goal: Patient/Family independently completes tube feeding Outcome: Progressing Goal: Patient will have  no more than 5 lb weight change during LOS Outcome: Progressing Goal: Patient will utilize adaptive techniques to administer nutrition Outcome: Progressing Goal: Patient will verbalize dietary restrictions Outcome: Progressing

## 2024-10-19 NOTE — Progress Notes (Signed)
 [facility]  Date of Admission:  10/16/2024      Total days of antibiotics 3   Vancomycin  LInezolid  Unasyn    ASSESSMENT: Phyllis Pittman is a 48 y.o. female admitted with:   Gangrenous Right Foot -  Insensate Neuropathy -  Uncontrolled DM -  Planning amputation later this week with Dr. Harden (R [facility]). This will be curative for her current problem. Will continue unasyn + linezolid. Stop vancomycin given she is not bacteremic.  She should not need more than 24 hours of antibiotics after her amputation.   Diarrhea -  Fleurette out 3d course of Azithromycin for treatment of EPEC   Chronic Dermatitis on Face and hands -  ?eczematous condition  OP with dermatology   ID will sign off - please call back with any questions/concerns or if we can be of further assistance.  Please call back if she decides to forego definitive surgical management    PLAN: Continue linezolid + unasyn 24 until Thursday of this week  No home antibiotics planned with definitive source control.    Principal Problem:   Osteomyelitis (HCC) Active Problems:   Acute osteomyelitis of phalanx of right foot (HCC)   Cutaneous abscess of right foot    Chlorhexidine Gluconate Cloth  6 each Topical Daily   collagenase   Topical Daily   enoxaparin  (LOVENOX ) injection  40 mg Subcutaneous Q24H   feeding supplement  237 mL Oral BID BM   gabapentin  800 mg Oral TID   hydrOXYzine   50 mg Oral BID   insulin  aspart  0-9 Units Subcutaneous Q4H   insulin  glargine-yfgn  15 Units Subcutaneous Daily   midodrine  5 mg Oral TID WC   nystatin  5 mL Oral QID   pantoprazole   40 mg Oral Daily    SUBJECTIVE: Trouble with ongoing diarrhea   Review of Systems: Review of Systems  Constitutional:  Negative for chills and fever.  HENT:  Negative for tinnitus.   Eyes:  Negative for blurred vision and photophobia.  Respiratory:  Negative for cough and sputum production.    Cardiovascular:  Negative for chest pain.  Gastrointestinal:  Positive for diarrhea. Negative for abdominal pain, nausea and vomiting.  Genitourinary:  Negative for dysuria.  Skin:  Positive for rash.  Neurological:  Negative for headaches.    Allergies  Allergen Reactions   Acyclovir Itching and Nausea Only   Dust Mite Extract Anaphylaxis, Hives, Shortness Of Breath, Itching, Swelling, Anxiety, Dermatitis and Rash   Molds & Smuts Anaphylaxis, Hives, Shortness Of Breath, Itching, Nausea Only, Swelling, Anxiety and Rash   Other Hives and Other (See Comments)    Dust, mold, powder   Prednisone Other (See Comments)    Hyperglycemia    Venlafaxine Shortness Of Breath and Other (See Comments)    Chest discomfort, too   Cephalexin  Nausea Only and Other (See Comments)    Tolerates via IV - not pills   Dulaglutide Nausea And Vomiting and Other (See Comments)    TRULICITY   Latex Dermatitis, Rash and Other (See Comments)    Powder only   Levofloxacin Nausea And Vomiting and Other (See Comments)    Can receive only via IV   Metformin Nausea And Vomiting   Semaglutide Other (See Comments)    Made her really sick (Ozempic or Wegovy)   Sulfa Antibiotics Nausea Only and Other (See Comments)    Stomach upset   Codeine  Other (See Comments)    Unknown reaction   Famotidine Other (See Comments)    Reaction not cited    OBJECTIVE: Vitals:   10/19/24 0655 10/19/24 0710 10/19/24 0755 10/19/24 1134  BP: (!) 94/58  (!) 102/58 105/68  Pulse: 83 85 86 85  Resp: 10 17 20 15   Temp:   99 F (37.2 C) 98.6 F (37 C)  TempSrc:   Oral Oral  SpO2: 90% 90% 90% 92%  Weight:      Height:       Body mass index is 27.34 kg/m.  Physical Exam Cardiovascular:     Rate and Rhythm: Normal rate and regular rhythm.  Pulmonary:     Effort: Pulmonary effort is normal.     Comments: No shortness of breath detected in conversation.  Neurological:     Mental Status: She is oriented to person,  place, and time.  Psychiatric:        Mood and Affect: Mood normal.        Behavior: Behavior normal.        Thought Content: Thought content normal.        Judgment: Judgment normal.        Lab Results Lab Results  Component Value Date   WBC 25.5 (H) 10/19/2024   HGB 8.0 (L) 10/19/2024   HCT 25.4 (L) 10/19/2024   MCV 76.7 (L) 10/19/2024   PLT 558 (H) 10/19/2024    Lab Results  Component Value Date   CREATININE 0.44 10/17/2024   BUN 8 10/17/2024   NA 132 (L) 10/17/2024   K 3.5 10/17/2024   CL 97 (L) 10/17/2024   CO2 26 10/17/2024    Lab Results  Component Value Date   ALT 23 10/17/2024   AST 77 (H) 10/17/2024   ALKPHOS 218 (H) 10/17/2024   BILITOT 0.3 10/17/2024     Microbiology: Recent Results (from the past 240 hours)  Blood Culture (routine x 2)     Status: None (Preliminary result)   Collection Time: 10/16/24  4:25 PM   Specimen: BLOOD RIGHT ARM  Result Value Ref Range Status   Specimen Description   Final    BLOOD RIGHT ARM Performed at [facility], [address] [place], [place] [address]    Special Requests   Final    BOTTLES DRAWN AEROBIC AND ANAEROBIC Blood Culture adequate volume Performed at [facility], [address] [place], [place] [address]    Culture   Final    NO GROWTH 3 DAYS Performed at [facility], [address] [place], [place] [address]    Report Status PENDING  Incomplete  MRSA Next Gen by PCR, Nasal     Status: None   Collection Time: 10/16/24  6:51 PM   Specimen: Nasal Mucosa; Nasal Swab  Result Value Ref Range Status   MRSA by PCR Next Gen NOT DETECTED NOT DETECTED Final    Comment: (NOTE) The GeneXpert MRSA Assay (FDA approved for NASAL specimens only), is one component of a comprehensive MRSA colonization surveillance program. It is not intended to diagnose MRSA infection nor to guide or monitor treatment for MRSA infections. Test performance is not FDA approved in patients less  than 24 years old. Performed at [facility], [address], [place], [place] [address]   Blood Culture (routine x 2)     Status: None (Preliminary result)   Collection Time: 10/16/24  9:12 PM   Specimen: BLOOD  RIGHT HAND  Result Value Ref Range Status   Specimen Description   Final    BLOOD RIGHT HAND Performed at [facility], [address] [place], [place] [address]    Special Requests   Final    BOTTLES DRAWN AEROBIC AND ANAEROBIC Blood Culture results may not be optimal due to an inadequate volume of blood received in culture bottles Performed at [facility], [address] [place], [place] [address]    Culture   Final    NO GROWTH 3 DAYS Performed at [facility], [address], [place], [place] [address]    Report Status PENDING  Incomplete  Urine Culture (for pregnant, neutropenic or urologic patients or patients with an indwelling urinary catheter)     Status: Abnormal   Collection Time: 10/16/24 11:40 PM   Specimen: Urine, Clean Catch  Result Value Ref Range Status   Specimen Description   Final    URINE, CLEAN CATCH Performed at [facility], [address], [place], [place] [address]    Special Requests   Final    NONE Performed at [facility], [address] [place], [place] [address]    Culture MULTIPLE SPECIES PRESENT, SUGGEST RECOLLECTION (A)  Final   Report Status 10/18/2024 FINAL  Final  Gastrointestinal Panel by PCR , Stool     Status: Abnormal   Collection Time: 10/17/24  6:59 AM   Specimen: Stool  Result Value Ref Range Status   Campylobacter species NOT DETECTED NOT DETECTED Final   Plesimonas shigelloides NOT DETECTED NOT DETECTED Final   Salmonella species NOT DETECTED NOT DETECTED Final   Yersinia enterocolitica NOT DETECTED NOT DETECTED Final   Vibrio species NOT DETECTED NOT DETECTED Final   Vibrio cholerae NOT DETECTED NOT DETECTED Final    Enteroaggregative E coli (EAEC) NOT DETECTED NOT DETECTED Final   Enteropathogenic E coli (EPEC) DETECTED (A) NOT DETECTED Final    Comment: RESULT CALLED TO, READ BACK BY AND VERIFIED WITH: MICHELLE B., RN AT 1648 10/17/24 RAM    Enterotoxigenic E coli (ETEC) NOT DETECTED NOT DETECTED Final   Shiga like toxin producing E coli (STEC) NOT DETECTED NOT DETECTED Final   Shigella/Enteroinvasive E coli (EIEC) NOT DETECTED NOT DETECTED Final   Cryptosporidium NOT DETECTED NOT DETECTED Final   Cyclospora cayetanensis NOT DETECTED NOT DETECTED Final   Entamoeba histolytica NOT DETECTED NOT DETECTED Final   Giardia lamblia NOT DETECTED NOT DETECTED Final   Adenovirus F40/41 NOT DETECTED NOT DETECTED Final   Astrovirus NOT DETECTED NOT DETECTED Final   Norovirus GI/GII NOT DETECTED NOT DETECTED Final   Rotavirus A NOT DETECTED NOT DETECTED Final   Sapovirus (I, II, IV, and V) NOT DETECTED NOT DETECTED Final    Comment: Performed at [facility], [address] [place], [place] [address]  C Difficile Quick Screen w PCR reflex     Status: None   Collection Time: 10/18/24  3:43 PM   Specimen: STOOL  Result Value Ref Range Status   C Diff antigen NEGATIVE NEGATIVE Final   C Diff toxin NEGATIVE NEGATIVE Final   C Diff interpretation No C. difficile detected.  Final    Comment: Performed at [facility], [address], [place], [place] [address]    Corean Fireman, MSN, NP-C [facility]  [email or web address] Pager: [phone] Office: [phone] [facility] Main Line: [phone] *Secure Chat Communication Welcome  Total  Encounter Time:

## 2024-10-20 DIAGNOSIS — M86171 Other acute osteomyelitis, right ankle and foot: Secondary | ICD-10-CM | POA: Diagnosis not present

## 2024-10-20 LAB — ABO/RH: ABO/RH(D): B POS

## 2024-10-20 LAB — GLUCOSE, CAPILLARY
Glucose-Capillary: 74 mg/dL (ref 70–99)
Glucose-Capillary: 190 mg/dL — ABNORMAL HIGH (ref 70–99)
Glucose-Capillary: 137 mg/dL — ABNORMAL HIGH (ref 70–99)
Glucose-Capillary: 173 mg/dL — ABNORMAL HIGH (ref 70–99)
Glucose-Capillary: 167 mg/dL — ABNORMAL HIGH (ref 70–99)
Glucose-Capillary: 186 mg/dL — ABNORMAL HIGH (ref 70–99)

## 2024-10-20 LAB — PREPARE RBC (CROSSMATCH)

## 2024-10-20 LAB — HEMOGLOBIN A1C
Hgb A1c MFr Bld: >15.5 % — ABNORMAL HIGH (ref 4.8–5.6)
Mean Plasma Glucose: >398 mg/dL

## 2024-10-20 MED ORDER — SODIUM CHLORIDE 0.9% IV SOLUTION: Freq: Once | INTRAVENOUS | Status: AC

## 2024-10-20 MED ORDER — CHLORHEXIDINE GLUCONATE 4 % EX SOLN
60.0000 mL | Freq: Once | CUTANEOUS | Status: DC
  Filled 2024-10-20: qty 60

## 2024-10-20 MED ORDER — MIDODRINE HCL 5 MG PO TABS
10.0000 mg | ORAL_TABLET | Freq: Three times a day (TID) | ORAL | Status: DC
  Administered 2024-10-20 – 2024-10-31 (×30): 10 mg via ORAL
  Filled 2024-10-20 (×32): qty 2

## 2024-10-20 MED ORDER — KETOROLAC TROMETHAMINE 15 MG/ML IJ SOLN
15.0000 mg | Freq: Once | INTRAMUSCULAR | Status: AC
  Administered 2024-10-20: 15 mg via INTRAVENOUS
  Filled 2024-10-20: qty 1

## 2024-10-20 MED ORDER — VANCOMYCIN HCL IN DEXTROSE 1-5 GM/200ML-% IV SOLN
1000.0000 mg | INTRAVENOUS | Status: AC
  Filled 2024-10-20: qty 200

## 2024-10-20 MED ORDER — GENTAMICIN SULFATE 40 MG/ML IJ SOLN
5.0000 mg/kg | INTRAVENOUS | Status: AC
  Administered 2024-10-21: 320 mg via INTRAVENOUS
  Filled 2024-10-20 (×3): qty 8

## 2024-10-20 MED ORDER — LIVING WELL WITH DIABETES BOOK
Freq: Once | Status: AC
  Filled 2024-10-20: qty 1

## 2024-10-20 MED ORDER — POVIDONE-IODINE 10 % EX SWAB: 2.0000 | Freq: Once | CUTANEOUS | Status: DC

## 2024-10-20 NOTE — Inpatient Diabetes Management (Addendum)
 Inpatient Diabetes Program Recommendations  AACE/ADA: New Consensus Statement on Inpatient Glycemic Control (2015)  Target Ranges:  Prepandial:   less than 140 mg/dL      Peak postprandial:   less than 180 mg/dL (1-2 hours)      Critically ill patients:  140 - 180 mg/dL   Lab Results  Component Value Date   GLUCAP 137 (H) 10/20/2024   HGBA1C >15.5 (H) 10/19/2024    Latest Reference Range & Units 10/19/24 19:45 10/19/24 23:27 10/20/24 04:28 10/20/24 07:41 10/20/24 11:16  Glucose-Capillary 70 - 99 mg/dL 774 (H) 894 (H) 74 809 (H) 137 (H)   Review of Glycemic Control  Spoke with patient at bedside. Patient reports being followed by Lionel Molt, PA with Endocrinology for diabetes management. Patient states she typically takes Tresiba 16 units daily and Humalog 3-5 TID. However, she has not taken her DM medications in a while. She states My Insulin  is free, I just have had a lot going on recently and not taken it. She has used DexconG7 in the past but does not wish to use the CGM at this time. She was also not checking her blood sugar at home. Patient states she needs a new meter. Inquired about prior A1C and patient reports not being able to recall last A1C value. Discussed A1C results >15.5% and explained that current A1C indicates an average glucose of 398 mg/dl over the past 2-3 months. Discussed glucose and A1C goals. Discussed importance of checking CBGs and maintaining good CBG control to prevent long-term and short-term complications. Explained how hyperglycemia leads to damage within blood vessels which lead to the common complications seen with uncontrolled diabetes. Discussed impact of nutrition, exercise, stress, sickness, and medications on diabetes control.  Discussed carbohydrates, carbohydrate goals per day and meal, along with portion sizes. Encouraged patient to check glucose 4 times per day (before meals and at bedtime) and to keep a log book of glucose readings and DM medication  taken which patient will need to take to doctor appointments. Explained how the doctor can use the log book to continue to make adjustments with DM medications if needed.  Patient verbalized understanding of information discussed and reports no further questions at this time related to diabetes.  Living Well with Diabetes book ordered.   Please send Rx for new meter at discharge   Thanks,  Lavanda Search, RN, MSN, [facility]  Inpatient Diabetes Coordinator  Pager [phone] (8a-5p)

## 2024-10-20 NOTE — Plan of Care (Signed)
  Problem: Coping: Goal: Ability to adjust to condition or change in health will improve Outcome: Progressing   Problem: Fluid Volume: Goal: Ability to maintain a balanced intake and output will improve Outcome: Progressing   Problem: Metabolic: Goal: Ability to maintain appropriate glucose levels will improve Outcome: Progressing   Problem: Nutritional: Goal: Maintenance of adequate nutrition will improve Outcome: Progressing Goal: Progress toward achieving an optimal weight will improve Outcome: Progressing   Problem: Tissue Perfusion: Goal: Adequacy of tissue perfusion will improve Outcome: Progressing   Problem: Skin Integrity: Goal: Risk for impaired skin integrity will decrease Outcome: Progressing

## 2024-10-20 NOTE — Progress Notes (Signed)
 PROGRESS NOTE    Phyllis Pittman  FMW:[id number] DOB: [birth year] DOA: 10/16/2024 PCP: Phyllis Pittman  Chief Complaint  Patient presents with   Wound Infection   Facial Swelling    Brief Narrative:   48 y.o. female with medical history significant for anxiety, history of CVA, depression with anxiety, diabetes mellitus type 2 which is uncontrolled, hypertension, hyperlipidemia GERD, migraines, neuropathy, sciatica as well as other comorbidities who was recently treated for Phyllis Pittman left foot infection and had Phyllis Pittman PICC line.  She went to go see her wound care specialist last week and was reportedly found to have Phyllis Pittman new wound with Phyllis Pittman fever at that time.  At that time her right foot was swollen and erythematous up to the ankle with congested discolored small toe with multiple open wounds and had Phyllis Pittman unstable lateral aspect concerning small toe as well as circumferential blistering of the fourth webspace.  On 10/07/2024 she was advised to go to the emergency room for further evaluation and antibiotics and formal debridement.  She did not go to the ER and kept her Vashe wet-to-dry dressings in place and went back to the wound care center on 10/14/2024 where she was again advised to go to the ED given that her foot had developed and necrotic duskiness and dark eschar on both the plantar and dorsal surface 6 as well as an extensive order.     She's been admitted with diabetic foot infection, gangrenous R foot.  On broad spectrum abx.  Ortho planning AKA 11/5.   Assessment & Plan:   Principal Problem:   Osteomyelitis (HCC) Active Problems:   Acute osteomyelitis of phalanx of right foot (HCC)   Cutaneous abscess of right foot   Gangrene of right foot (HCC)   Facial dermatitis   Diarrhea of infectious origin  Sepsis Diabetic Foot Infection  Gangrenous R Foot Cellulitis and Possible Septic Arthritis Rules in with fever (last 11/1 PM), leukocytosis, diabetic foot infection  Plain films with findings  consistent with cellulitis and infection with gas forming organism on R foot.  Osteomyelitis and possible septic arthritis. MRI with osteo involving the 5th metatarsal, 4th metatarsal head, and proximal phalanges of the 4tha nd 5th toes with associated gas in the middle and distal phalanges of the 5th toe compatible with Phyllis Pittman gas forming organism.  Extensive gas in dorsal soft tissues, especially laterally, compatible with cellulitis due to gas producing organism.  Possible associated ulceration/blistering.  Gas tracing along plantar fascia and flexor digitorum longus tendon and musculature, compatible with infectious myositis and tenosynovitis.  Circumferential subcutaneous edema of forefoot with extension into toes.  Generalized muscle edema.   Abx with linezolid and unasyn through 24 hrs after amputation Palpable PT pulse.  ABI pending.  Vascular does not think the foot is salvageable and likely to require major amputation regardless of vascular studies. Ortho c/s, appreciate recs - Phyllis Pittman planning R BKA Wednesday. Blood culture x1 (10/31) NGTD Recurrent fever 11/4, will repeat blood cultures Sed rate 137, CRP 34.6 Urine cx multiple species.  Negative MRSA PCR.  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  Follow outpatient.   Mild Subcutaneous fat stranding within posterior R neck Will monitor   History of POTS Noted   Diarrhea GI path panel with EPEC C diff negative Completed course of azithro Imodium prn  Hyponatremia Labs pending today  Hypophosphatemia   Hypomagnesemia  Hypercalcemia Replace and follow   Elevated Anion Gap Resolved  Hypoalbuminemia In setting of her acute infection  Decubitus Wounds Appreciate wound care c/s  Uses walker at baseline.  Drives.  Cooks, dresses herself.  Per discussion with family, doesn't do Phyllis Pittman great job taking care of her health.      DVT prophylaxis: lovenox  Code Status: full Family Communication: none Disposition:   Status is: Inpatient Remains inpatient appropriate because: need for continued inpatient care   Consultants:  Vascular ID ortho  Procedures:  Echo IMPRESSIONS     1. Left ventricular ejection fraction, by estimation, is 60 to 65%. The  left ventricle has normal function. The left ventricle has no regional  wall motion abnormalities. Left ventricular diastolic parameters were  normal.   2. Right ventricular systolic function is normal. The right ventricular  size is normal. There is normal pulmonary artery systolic pressure.   3. The mitral valve is normal in structure. No evidence of mitral valve  regurgitation. No evidence of mitral stenosis.   4. The aortic valve has an indeterminant number of cusps. Aortic valve  regurgitation is not visualized. No aortic stenosis is present.   5. The inferior vena cava is dilated in size with >50% respiratory  variability, suggesting right atrial pressure of 8 mmHg.   Comparison(s): No prior Echocardiogram.   Conclusion(s)/Recommendation(s): No evidence of valvular vegetations on  this transthoracic echocardiogram. Consider Phyllis Pittman transesophageal  echocardiogram to exclude infective endocarditis if clinically indicated.   Antimicrobials:  Anti-infectives (From admission, onward)    Start     Dose/Rate Route Frequency Ordered Stop   10/17/24 1945  Vancomycin (VANCOCIN) 1,250 mg in sodium chloride  0.9 % 250 mL IVPB  Status:  Discontinued       Placed in Followed by Linked Group   1,250 mg 166.7 mL/hr over 90 Minutes Intravenous  Every 24 hours 10/16/24 1845 10/16/24 1847   10/17/24 1945  vancomycin (VANCOREADY) IVPB 1250 mg/250 mL  Status:  Discontinued       Placed in Followed by Linked Group   1,250 mg 166.7 mL/hr over 90 Minutes Intravenous Every 24 hours 10/16/24 1848 10/19/24 1108   10/17/24 1800  azithromycin (ZITHROMAX) tablet 500 mg        500 mg Oral Daily 10/17/24 1709 10/19/24 0928   10/17/24 0530  linezolid (ZYVOX) IVPB 600 mg        600 mg 300 mL/hr over 60 Minutes Intravenous Every 12 hours 10/16/24 1752 10/27/24 0529   10/17/24 0130  Ampicillin-Sulbactam (UNASYN) 3 g in sodium chloride  0.9 % 100 mL IVPB        3 g 200 mL/hr over 30 Minutes Intravenous Every 6 hours 10/16/24 1845     10/16/24 2000  meropenem (MERREM) 1 g in sodium chloride  0.9 % 100 mL IVPB  Status:  Discontinued        1 g 200 mL/hr over 30 Minutes Intravenous Every 8 hours 10/16/24 1825 10/16/24 1831   10/16/24 1945  vancomycin (VANCOREADY) IVPB 1500 mg/300 mL       Placed in Followed by Linked Group   1,500 mg 150 mL/hr over 120 Minutes Intravenous  Once 10/16/24 1848 10/16/24 2249  10/16/24 1930  Vancomycin (VANCOCIN) 1,500 mg in sodium chloride  0.9 % 500 mL IVPB  Status:  Discontinued       Placed in Followed by Linked Group   1,500 mg 250 mL/hr over 120 Minutes Intravenous  Once 10/16/24 1845 10/16/24 1847   10/16/24 1800  valACYclovir  (VALTREX ) tablet 500 mg  Status:  Discontinued        500 mg Oral Daily 10/16/24 1752 10/16/24 2206   10/16/24 1800  meropenem (MERREM) 1 g in sodium chloride  0.9 % 100 mL IVPB  Status:  Discontinued        1 g 200 mL/hr over 30 Minutes Intravenous  Once 10/16/24 1752 10/16/24 1817   10/16/24 1600  Vancomycin (VANCOCIN) 1,500 mg in sodium chloride  0.9 % 500 mL IVPB  Status:  Discontinued        1,500 mg 250 mL/hr over 120 Minutes Intravenous  Once 10/16/24 1506 10/16/24 1512   10/16/24 1600  Vancomycin (VANCOCIN) 1,500 mg in sodium chloride  0.9 % 500 mL IVPB  Status:  Discontinued         1,500 mg 250 mL/hr over 120 Minutes Intravenous  Once 10/16/24 1512 10/16/24 1525   10/16/24 1600  linezolid (ZYVOX) IVPB 600 mg        600 mg 300 mL/hr over 60 Minutes Intravenous  Once 10/16/24 1525 10/16/24 1835   10/16/24 1530  piperacillin-tazobactam (ZOSYN) IVPB 3.375 g        3.375 g 100 mL/hr over 30 Minutes Intravenous  Once 10/16/24 1506 10/16/24 1751       Subjective: No new complaints   Objective: Vitals:   10/20/24 1509 10/20/24 1742 10/20/24 1743 10/20/24 1842  BP: 125/81  (!) 143/84   Pulse: 90  92   Resp: 15  20   Temp: 98.7 F (37.1 C) 100 F (37.8 C) 100.3 F (37.9 C) (!) 100.5 F (38.1 C)  TempSrc: Oral Oral Oral Oral  SpO2: 98%  93%   Weight:      Height:        Intake/Output Summary (Last 24 hours) at 10/20/2024 1912 Last data filed at 10/20/2024 1850 Gross per 24 hour  Intake 480 ml  Output 1050 ml  Net -570 ml   Filed Weights   10/16/24 1320 10/16/24 1832 10/17/24 1449  Weight: 65.8 kg 54.8 kg 63.5 kg    Examination:  General: No acute distress. Cardiovascular: RRR Lungs: unlabored Neurological: Alert and oriented 3. Moves all extremities 4 with equal strength. Cranial nerves II through XII grossly intact. Skin: Warm and dry. No rashes or lesions. Extremities: RLE with intact dressing    Data Reviewed: I have personally reviewed following labs and imaging studies  CBC: Recent Labs  Lab 10/16/24 1632 10/16/24 2112 10/17/24 0723 10/19/24 1013  WBC 34.2* 32.2* 28.1* 25.5*  NEUTROABS 29.3*  --   --   --   HGB 10.9* 9.3* 8.1* 8.0*  HCT 35.9* 30.1* 26.5* 25.4*  MCV 78.6* 78.8* 79.6* 76.7*  PLT 552* 523* 462* 558*    Basic Metabolic Panel: Recent Labs  Lab 10/16/24 2112 10/17/24 0253 10/17/24 0723 10/17/24 0920 10/19/24 1013  NA 130* 133* 130* 132* 130*  K 3.2* 3.0* 3.2* 3.5 3.1*  CL 88* 96* 95* 97* 93*  CO2 28 28 27 26 26   GLUCOSE 286* 199* 182* 188* 116*  BUN 15 11 9 8  <5*  CREATININE 0.68 0.48 0.50 0.44 0.49   CALCIUM 9.2 8.3* 8.4* 8.1* 7.6*  MG 1.4*  --   --  1.2* 1.4*  PHOS 1.8*  --   --  1.5* 2.9    GFR: Estimated Creatinine Clearance: 72.3 mL/min (by C-G formula based on SCr of 0.49 mg/dL).  Liver Function Tests: Recent Labs  Lab 10/16/24 1632 10/17/24 0723 10/19/24 1013  AST 37 77* 35  ALT 22 23 19   ALKPHOS 266* 218* 194*  BILITOT 0.5 0.3 0.4  PROT 10.2* 6.8 6.7  ALBUMIN 3.1* 2.0* <1.5*    CBG: Recent Labs  Lab 10/19/24 2327 10/20/24 0428 10/20/24 0741 10/20/24 1116 10/20/24 1559  GLUCAP 105* 74 190* 137* 173*     Recent Results (from the past 240 hours)  Blood Culture (routine x 2)     Status: None (Preliminary result)   Collection Time: 10/16/24  4:25 PM   Specimen: BLOOD RIGHT ARM  Result Value Ref Range Status   Specimen Description   Final    BLOOD RIGHT ARM Performed at [facility], [address] [place], [place] [address]    Special Requests   Final    BOTTLES DRAWN AEROBIC AND ANAEROBIC Blood Culture adequate volume Performed at [facility], [address] [place], [place] [address]    Culture   Final    NO GROWTH 4 DAYS Performed at [facility], [address], [place], [place] [address]    Report Status PENDING  Incomplete  MRSA Next Gen by PCR, Nasal     Status: None   Collection Time: 10/16/24  6:51 PM   Specimen: Nasal Mucosa; Nasal Swab  Result Value Ref Range Status   MRSA by PCR Next Gen NOT DETECTED NOT DETECTED Final    Comment: (NOTE) The GeneXpert MRSA Assay (FDA approved for NASAL specimens only), is one component of [facility] comprehensive MRSA colonization surveillance program. It is not intended to diagnose MRSA infection nor to guide or monitor treatment for MRSA infections. Test performance is not FDA approved in patients less than 74 years old. Performed at [facility], [address] [place], [place] [address]   Blood Culture (routine x 2)     Status: None (Preliminary  result)   Collection Time: 10/16/24  9:12 PM   Specimen: BLOOD RIGHT HAND  Result Value Ref Range Status   Specimen Description   Final    BLOOD RIGHT HAND Performed at [facility], [address], [place], [place] [address]    Special Requests   Final    BOTTLES DRAWN AEROBIC AND ANAEROBIC Blood Culture results may not be optimal due to an inadequate volume of blood received in culture bottles Performed at [facility], [address], [place], [place] [address]    Culture   Final    NO GROWTH 4 DAYS Performed at [facility], [address], [place], [place] [address]    Report Status PENDING  Incomplete  Urine Culture (for pregnant, neutropenic or urologic patients or patients with an indwelling urinary catheter)     Status: Abnormal   Collection Time: 10/16/24 11:40 PM   Specimen: Urine, Clean Catch  Result Value Ref Range Status   Specimen Description   Final    URINE, CLEAN CATCH Performed at [facility], [address], [place], [place] [address]    Special Requests   Final    NONE Performed at [facility], [address] [place], [place] [address]    Culture MULTIPLE SPECIES PRESENT, SUGGEST RECOLLECTION ([place])  Final   Report Status 10/18/2024 FINAL  Final  Gastrointestinal Panel by PCR , Stool     Status: Abnormal   Collection Time: 10/17/24  6:59 AM   Specimen: Stool  Result Value Ref Range Status   Campylobacter species NOT DETECTED NOT DETECTED Final   Plesimonas shigelloides NOT DETECTED NOT DETECTED Final   Salmonella species NOT DETECTED NOT DETECTED Final   Yersinia enterocolitica NOT DETECTED NOT DETECTED Final   Vibrio species NOT DETECTED NOT DETECTED Final   Vibrio cholerae NOT DETECTED NOT DETECTED Final   Enteroaggregative E coli (EAEC) NOT DETECTED NOT DETECTED Final   Enteropathogenic E coli (EPEC) DETECTED ([place]) NOT DETECTED Final    Comment: RESULT CALLED TO, READ BACK BY AND  VERIFIED WITH: MICHELLE B., RN AT 1648 10/17/24 RAM    Enterotoxigenic E coli (ETEC) NOT DETECTED NOT DETECTED Final   Shiga like toxin producing E coli (STEC) NOT DETECTED NOT DETECTED Final   Shigella/Enteroinvasive E coli (EIEC) NOT DETECTED NOT DETECTED Final   Cryptosporidium NOT DETECTED NOT DETECTED Final   Cyclospora cayetanensis NOT DETECTED NOT DETECTED Final   Entamoeba histolytica NOT DETECTED NOT DETECTED Final   Giardia lamblia NOT DETECTED NOT DETECTED Final   Adenovirus F40/41 NOT DETECTED NOT DETECTED Final   Astrovirus NOT DETECTED NOT DETECTED Final   Norovirus GI/GII NOT DETECTED NOT DETECTED Final   Rotavirus Rayana Geurin NOT DETECTED NOT DETECTED Final   Sapovirus (I, II, IV, and V) NOT DETECTED NOT DETECTED Final    Comment: Performed at [facility], [address] [place], [place] [address]  C Difficile Quick Screen w PCR reflex     Status: None   Collection Time: 10/18/24  3:43 PM   Specimen: STOOL  Result Value Ref Range Status   C Diff antigen NEGATIVE NEGATIVE Final   C Diff toxin NEGATIVE NEGATIVE Final   C Diff interpretation No C. difficile detected.  Final    Comment: Performed at [facility], [address] [place], [place] [address]         Radiology Studies: No results found.       Scheduled Meds:  sodium chloride    Intravenous Once   Chlorhexidine Gluconate Cloth  6 each Topical Daily   collagenase   Topical Daily   enoxaparin  (LOVENOX ) injection  40 mg Subcutaneous Q24H   feeding supplement  237 mL Oral BID BM   ferrous sulfate  325 mg Oral QODAY   gabapentin  800 mg Oral TID   hydrOXYzine   50 mg Oral BID   insulin  aspart  0-9 Units Subcutaneous Q4H   insulin  glargine-yfgn  15 Units Subcutaneous Daily   ketorolac   15 mg Intravenous Once   midodrine  10 mg Oral TID WC   nystatin  5 mL Oral QID   pantoprazole   40 mg Oral Daily   Continuous Infusions:  ampicillin-sulbactam (UNASYN) IV 3 g (10/20/24 1424)   linezolid  (ZYVOX) IV 600 mg (10/20/24 1617)     LOS: 4 days    Time spent: over 30 min     Meliton Monte, MD [facility]   To contact the attending provider between 7A-7P or the covering provider during after hours 7P-7A, please log into the web site [email or web address] and access using universal [place] password for that web site. If you do not have the password, please call the hospital operator.  10/20/2024, 7:12 PM

## 2024-10-20 NOTE — Progress Notes (Signed)
 Patient's temperature is 100.5 F, she feels cold and chills, Tylenol  650mg  given, notified to the attending MD, will continue to monitor.

## 2024-10-21 ENCOUNTER — Encounter (HOSPITAL_COMMUNITY): Payer: Self-pay | Admitting: Internal Medicine

## 2024-10-21 ENCOUNTER — Encounter (HOSPITAL_COMMUNITY): Admitting: Anesthesiology

## 2024-10-21 ENCOUNTER — Inpatient Hospital Stay (HOSPITAL_COMMUNITY): Admitting: Anesthesiology

## 2024-10-21 ENCOUNTER — Encounter (HOSPITAL_COMMUNITY)
Admission: EM | Disposition: A | Discharge status: Skilled Nursing Facility | Payer: Self-pay | Source: Ambulatory Visit | Attending: Family Medicine

## 2024-10-21 DIAGNOSIS — I96 Gangrene, not elsewhere classified: Secondary | ICD-10-CM | POA: Diagnosis not present

## 2024-10-21 DIAGNOSIS — F17210 Nicotine dependence, cigarettes, uncomplicated: Secondary | ICD-10-CM

## 2024-10-21 DIAGNOSIS — M869 Osteomyelitis, unspecified: Secondary | ICD-10-CM | POA: Diagnosis not present

## 2024-10-21 DIAGNOSIS — E1152 Type 2 diabetes mellitus with diabetic peripheral angiopathy with gangrene: Secondary | ICD-10-CM | POA: Diagnosis not present

## 2024-10-21 DIAGNOSIS — M86271 Subacute osteomyelitis, right ankle and foot: Secondary | ICD-10-CM

## 2024-10-21 DIAGNOSIS — I10 Essential (primary) hypertension: Secondary | ICD-10-CM | POA: Diagnosis not present

## 2024-10-21 DIAGNOSIS — L02611 Cutaneous abscess of right foot: Secondary | ICD-10-CM | POA: Diagnosis not present

## 2024-10-21 HISTORY — PX: APPLICATION OF WOUND VAC: SHX5189

## 2024-10-21 HISTORY — PX: AMPUTATION: SHX166

## 2024-10-21 LAB — CBC WITH DIFFERENTIAL/PLATELET
Abs Immature Granulocytes: 0.5 K/uL — ABNORMAL HIGH (ref 0.00–0.07)
Basophils Absolute: 0.1 K/uL (ref 0.0–0.1)
Basophils Relative: 0 %
Eosinophils Absolute: 0.2 K/uL (ref 0.0–0.5)
Eosinophils Relative: 1 %
HCT: 29.6 % — ABNORMAL LOW (ref 36.0–46.0)
Hemoglobin: 9.8 g/dL — ABNORMAL LOW (ref 12.0–15.0)
Immature Granulocytes: 3 %
Lymphocytes Relative: 22 %
Lymphs Abs: 3.9 K/uL (ref 0.7–4.0)
MCH: 25.4 pg — ABNORMAL LOW (ref 26.0–34.0)
MCHC: 33.1 g/dL (ref 30.0–36.0)
MCV: 76.7 fL — ABNORMAL LOW (ref 80.0–100.0)
Monocytes Absolute: 1.1 K/uL — ABNORMAL HIGH (ref 0.1–1.0)
Monocytes Relative: 6 %
Neutro Abs: 12 K/uL — ABNORMAL HIGH (ref 1.7–7.7)
Neutrophils Relative %: 68 %
Platelets: 560 K/uL — ABNORMAL HIGH (ref 150–400)
RBC: 3.86 MIL/uL — ABNORMAL LOW (ref 3.87–5.11)
RDW: 15 % (ref 11.5–15.5)
WBC: 17.7 K/uL — ABNORMAL HIGH (ref 4.0–10.5)
nRBC: 0 % (ref 0.0–0.2)

## 2024-10-21 LAB — GLUCOSE, CAPILLARY
Glucose-Capillary: 183 mg/dL — ABNORMAL HIGH (ref 70–99)
Glucose-Capillary: 206 mg/dL — ABNORMAL HIGH (ref 70–99)
Glucose-Capillary: 223 mg/dL — ABNORMAL HIGH (ref 70–99)
Glucose-Capillary: 232 mg/dL — ABNORMAL HIGH (ref 70–99)
Glucose-Capillary: 187 mg/dL — ABNORMAL HIGH (ref 70–99)
Glucose-Capillary: 198 mg/dL — ABNORMAL HIGH (ref 70–99)
Glucose-Capillary: 265 mg/dL — ABNORMAL HIGH (ref 70–99)

## 2024-10-21 LAB — TYPE AND SCREEN
ABO/RH(D): B POS
Antibody Screen: NEGATIVE
Unit division: 0

## 2024-10-21 LAB — COMPREHENSIVE METABOLIC PANEL WITH GFR
ALT: 19 U/L (ref 0–44)
AST: 26 U/L (ref 15–41)
Albumin: <1.5 g/dL — ABNORMAL LOW (ref 3.5–5.0)
Alkaline Phosphatase: 212 U/L — ABNORMAL HIGH (ref 38–126)
Anion gap: 10 (ref 5–15)
BUN: 5 mg/dL — ABNORMAL LOW (ref 6–20)
CO2: 28 mmol/L (ref 22–32)
Calcium: 7.6 mg/dL — ABNORMAL LOW (ref 8.9–10.3)
Chloride: 93 mmol/L — ABNORMAL LOW (ref 98–111)
Creatinine, Ser: 0.51 mg/dL (ref 0.44–1.00)
GFR, Estimated: >60 mL/min (ref >60)
Glucose, Bld: 244 mg/dL — ABNORMAL HIGH (ref 70–99)
Potassium: 3.7 mmol/L (ref 3.5–5.1)
Sodium: 131 mmol/L — ABNORMAL LOW (ref 135–145)
Total Bilirubin: 0.4 mg/dL (ref 0.0–1.2)
Total Protein: 6.1 g/dL — ABNORMAL LOW (ref 6.5–8.1)

## 2024-10-21 LAB — CULTURE, BLOOD (ROUTINE X 2)
Culture: NO GROWTH
Culture: NO GROWTH
Special Requests: ADEQUATE

## 2024-10-21 LAB — BPAM RBC
Blood Product Expiration Date: 202511272359
ISSUE DATE / TIME: 202511042317
Unit Type and Rh: 7300

## 2024-10-21 SURGERY — AMPUTATION BELOW KNEE
Anesthesia: Regional | Site: Leg Lower | Laterality: Right

## 2024-10-21 MED ORDER — DOCUSATE SODIUM 100 MG PO CAPS: 100.0000 mg | ORAL_CAPSULE | Freq: Every day | ORAL | Status: DC

## 2024-10-21 MED ORDER — ONDANSETRON HCL 4 MG/2ML IJ SOLN: 4.0000 mg | Freq: Once | INTRAMUSCULAR | Status: DC | PRN

## 2024-10-21 MED ORDER — ONDANSETRON HCL 4 MG/2ML IJ SOLN: 4.0000 mg | Freq: Four times a day (QID) | INTRAMUSCULAR | Status: DC | PRN

## 2024-10-21 MED ORDER — PHENYLEPHRINE 80 MCG/ML (10ML) SYRINGE FOR IV PUSH (FOR BLOOD PRESSURE SUPPORT)
PREFILLED_SYRINGE | INTRAVENOUS | Status: AC
  Filled 2024-10-21: qty 10

## 2024-10-21 MED ORDER — LABETALOL HCL 5 MG/ML IV SOLN: 10.0000 mg | INTRAVENOUS | Status: DC | PRN

## 2024-10-21 MED ORDER — CHLORHEXIDINE GLUCONATE 0.12 % MT SOLN
OROMUCOSAL | Status: AC
  Administered 2024-10-21: 15 mL
  Filled 2024-10-21: qty 15

## 2024-10-21 MED ORDER — SODIUM CHLORIDE 0.9 % IV SOLN: INTRAVENOUS | Status: AC

## 2024-10-21 MED ORDER — PHENYLEPHRINE 80 MCG/ML (10ML) SYRINGE FOR IV PUSH (FOR BLOOD PRESSURE SUPPORT)
PREFILLED_SYRINGE | INTRAVENOUS | Status: DC | PRN
  Administered 2024-10-21 (×2): 80 ug via INTRAVENOUS
  Administered 2024-10-21: 160 ug via INTRAVENOUS
  Administered 2024-10-21: 80 ug via INTRAVENOUS

## 2024-10-21 MED ORDER — FENTANYL CITRATE (PF) 100 MCG/2ML IJ SOLN
INTRAMUSCULAR | Status: AC
  Filled 2024-10-21: qty 2

## 2024-10-21 MED ORDER — LIDOCAINE 2% (20 MG/ML) 5 ML SYRINGE
INTRAMUSCULAR | Status: DC | PRN
  Administered 2024-10-21: 40 mg via INTRAVENOUS

## 2024-10-21 MED ORDER — MIDAZOLAM HCL 2 MG/2ML IJ SOLN
INTRAMUSCULAR | Status: AC
  Filled 2024-10-21: qty 2

## 2024-10-21 MED ORDER — OXYCODONE HCL 5 MG PO TABS: 5.0000 mg | ORAL_TABLET | ORAL | Status: DC | PRN

## 2024-10-21 MED ORDER — FENTANYL CITRATE (PF) 250 MCG/5ML IJ SOLN
INTRAMUSCULAR | Status: DC | PRN
  Administered 2024-10-21: 25 ug via INTRAVENOUS

## 2024-10-21 MED ORDER — PROPOFOL 10 MG/ML IV BOLUS
INTRAVENOUS | Status: AC
  Filled 2024-10-21: qty 20

## 2024-10-21 MED ORDER — MEPERIDINE HCL 25 MG/ML IJ SOLN: 6.2500 mg | INTRAMUSCULAR | Status: DC | PRN

## 2024-10-21 MED ORDER — FENTANYL CITRATE (PF) 100 MCG/2ML IJ SOLN
25.0000 ug | INTRAMUSCULAR | Status: DC | PRN
  Administered 2024-10-21: 50 ug via INTRAVENOUS

## 2024-10-21 MED ORDER — VANCOMYCIN HCL IN DEXTROSE 1-5 GM/200ML-% IV SOLN
INTRAVENOUS | Status: AC
  Administered 2024-10-21: 1000 mg via INTRAVENOUS
  Filled 2024-10-21: qty 200

## 2024-10-21 MED ORDER — PROPOFOL 10 MG/ML IV BOLUS
INTRAVENOUS | Status: DC | PRN
  Administered 2024-10-21: 100 mg via INTRAVENOUS

## 2024-10-21 MED ORDER — ONDANSETRON HCL 4 MG/2ML IJ SOLN
INTRAMUSCULAR | Status: AC
Start: 2024-10-21 — End: 2024-10-21
  Filled 2024-10-21: qty 2

## 2024-10-21 MED ORDER — LIDOCAINE 2% (20 MG/ML) 5 ML SYRINGE
INTRAMUSCULAR | Status: AC
  Filled 2024-10-21: qty 5

## 2024-10-21 MED ORDER — ACETAMINOPHEN 325 MG PO TABS: 325.0000 mg | ORAL_TABLET | Freq: Four times a day (QID) | ORAL | Status: DC | PRN

## 2024-10-21 MED ORDER — ONDANSETRON HCL 4 MG/2ML IJ SOLN
INTRAMUSCULAR | Status: DC | PRN
  Administered 2024-10-21: 4 mg via INTRAVENOUS

## 2024-10-21 MED ORDER — POTASSIUM CHLORIDE CRYS ER 20 MEQ PO TBCR: 40.0000 meq | EXTENDED_RELEASE_TABLET | Freq: Every day | ORAL | Status: DC | PRN

## 2024-10-21 MED ORDER — OXYCODONE HCL 5 MG PO TABS: 5.0000 mg | ORAL_TABLET | Freq: Once | ORAL | Status: DC | PRN

## 2024-10-21 MED ORDER — HYDROMORPHONE HCL 1 MG/ML IJ SOLN
0.5000 mg | INTRAMUSCULAR | Status: DC | PRN
  Administered 2024-10-21: 1 mg via INTRAVENOUS
  Filled 2024-10-21: qty 1

## 2024-10-21 MED ORDER — OXYCODONE HCL 5 MG PO TABS
10.0000 mg | ORAL_TABLET | ORAL | Status: DC | PRN
  Administered 2024-10-21 – 2024-10-22 (×2): 15 mg via ORAL
  Filled 2024-10-21 (×2): qty 3

## 2024-10-21 MED ORDER — MIDAZOLAM HCL (PF) 2 MG/2ML IJ SOLN
INTRAMUSCULAR | Status: DC | PRN
  Administered 2024-10-21: 2 mg via INTRAVENOUS

## 2024-10-21 MED ORDER — OXYCODONE HCL 5 MG/5ML PO SOLN: 5.0000 mg | Freq: Once | ORAL | Status: DC | PRN

## 2024-10-21 MED ORDER — HYDRALAZINE HCL 20 MG/ML IJ SOLN: 5.0000 mg | INTRAMUSCULAR | Status: DC | PRN

## 2024-10-21 MED ORDER — VASHE WOUND IRRIGATION OPTIME
TOPICAL | Status: DC | PRN
  Administered 2024-10-21: 34 [oz_av]

## 2024-10-21 MED ORDER — ACETAMINOPHEN 500 MG PO TABS
1000.0000 mg | ORAL_TABLET | Freq: Four times a day (QID) | ORAL | Status: DC
  Administered 2024-10-22 (×2): 1000 mg via ORAL
  Filled 2024-10-21 (×2): qty 2

## 2024-10-21 MED ORDER — LACTATED RINGERS IV SOLN: INTRAVENOUS | Status: DC | PRN

## 2024-10-21 MED ORDER — METOPROLOL TARTRATE 5 MG/5ML IV SOLN: 2.0000 mg | INTRAVENOUS | Status: DC | PRN

## 2024-10-21 SURGICAL SUPPLY — 31 items
BAG COUNTER SPONGE SURGICOUNT (BAG) IMPLANT
BLADE SAW RECIP 87.9 MT (BLADE) ×2 IMPLANT
BLADE SURG 21 STRL SS (BLADE) ×2 IMPLANT
BNDG COHESIVE 6X5 TAN ST LF (GAUZE/BANDAGES/DRESSINGS) IMPLANT
CANISTER WOUND CARE 500ML ATS (WOUND CARE) ×2 IMPLANT
COVER SURGICAL LIGHT HANDLE (MISCELLANEOUS) ×2 IMPLANT
CUFF TRNQT CYL 34X4.125X (TOURNIQUET CUFF) ×2 IMPLANT
DRAPE INCISE IOBAN 66X45 STRL (DRAPES) ×2 IMPLANT
DRAPE U-SHAPE 47X51 STRL (DRAPES) ×2 IMPLANT
DRSG VAC PEEL AND PLACE LRG (GAUZE/BANDAGES/DRESSINGS) ×2 IMPLANT
DURAPREP 26ML APPLICATOR (WOUND CARE) ×2 IMPLANT
ELECTRODE REM PT RTRN 9FT ADLT (ELECTROSURGICAL) ×2 IMPLANT
GLOVE BIOGEL PI IND STRL 9 (GLOVE) ×2 IMPLANT
GLOVE SURG ORTHO 9.0 STRL STRW (GLOVE) ×2 IMPLANT
GOWN STRL REUS W/ TWL XL LVL3 (GOWN DISPOSABLE) ×4 IMPLANT
GRAFT SKIN WND MICRO 38 (Tissue) IMPLANT
KIT BASIN OR (CUSTOM PROCEDURE TRAY) ×2 IMPLANT
KIT TURNOVER KIT B (KITS) ×2 IMPLANT
MANIFOLD NEPTUNE II (INSTRUMENTS) ×2 IMPLANT
PACK ORTHO EXTREMITY (CUSTOM PROCEDURE TRAY) ×2 IMPLANT
PAD ARMBOARD POSITIONER FOAM (MISCELLANEOUS) ×2 IMPLANT
SOLN 0.9% NACL POUR BTL 1000ML (IV SOLUTION) ×2 IMPLANT
SPONGE T-LAP 18X18 ~~LOC~~+RFID (SPONGE) IMPLANT
STAPLER SKIN PROX 35W (STAPLE) IMPLANT
STOCKINETTE IMPERVIOUS LG (DRAPES) ×2 IMPLANT
SUT ETHILON 2 0 PSLX (SUTURE) IMPLANT
SUT SILK 2-0 18XBRD TIE 12 (SUTURE) ×2 IMPLANT
SUT VIC AB 1 CTX 27 (SUTURE) ×4 IMPLANT
TOWEL GREEN STERILE (TOWEL DISPOSABLE) ×2 IMPLANT
TUBE CONNECTING 12X1/4 (SUCTIONS) ×2 IMPLANT
YANKAUER SUCT BULB TIP NO VENT (SUCTIONS) ×2 IMPLANT

## 2024-10-21 NOTE — Interval H&P Note (Signed)
 History and Physical Interval Note:  10/21/2024 6:53 AM  Phyllis Pittman  has presented today for surgery, with the diagnosis of Gangrene Right Foot.  The various methods of treatment have been discussed with the patient and family. After consideration of risks, benefits and other options for treatment, the patient has consented to  Procedure(s) with comments: AMPUTATION BELOW KNEE (Right) - RIGHT BELOW KNEE AMPUTATION APPLICATION, WOUND VAC (Right) as a surgical intervention.  The patient's history has been reviewed, patient examined, no change in status, stable for surgery.  I have reviewed the patient's chart and labs.  Questions were answered to the patient's satisfaction.     Maxie Slovacek V Keonta Alsip

## 2024-10-21 NOTE — Progress Notes (Addendum)
 [facility]   ROUNDING   NOTE Phyllis Pittman FMW:[id number]  DOB: Mar 12, 1976  DOA: 10/16/2024  PCP: [name], PA  10/21/2024,2:24 PM  LOS: 5 days    Code Status: Full code     from: Home   48 year old female with poorly controlled diabetes mellitus HTN HLD diabetic neuropathy Has had diabetes for about 20 years been on insulin  for about 5  Admitted for diabetic foot wound infection  Chronology  7/15 [facility] evaluation cellulitis abscess concerning for soft tissue infection of left foot underwent I&D and partial fifth ray amputation 716 and was treated for strep agalactiae and a PICC line was placed and OPAT was performed-A1c was 12.2 she was told to follow-up in wound care center 10/31 represent to [facility] from wound care center after several visits to wound care office and being told to go to ED-found to be lethargic finally on 10/29 foul-smelling wound etc. etc.-started on antibiotics 11/1 because of diarrhea tested positive for EPEC and treated 11/2 infectious disease consulted 11/5 right-sided BKA Dr. Harden   Assessment  & Plan :    Sepsis on admission secondary to diabetic foot infection with gangrene on admission Surgical cure-ID recommends antibiotics  Unasyn and linezolid vancomycin through 11/6 and then stop Pain control Oxy IR 5-10 every 4 as needed, 10-15 for severe-scheduled Tylenol  1000 every 6 can use IV Dilaudid  0.5-1 every 4 as needed severe pain Continue tizanidine for at bedtime additionally Therapy to eval and discuss options-given that she has had left foot wound in the past with recent resection she may require skilled management initially She lives with her aunt and daughter in [place] but is from [place] to be made aware Dermatitis This was facial cellulitis?  This seems to be improved I do not notice anything out of the ordinary Can be followed by dermatology as an outpatient POTS syndrome Continues midodrine 10 3 times daily meals for  hypotension Only PRNs now an IV for blood pressure/elevated heart rate CIWA score EPEC diarrhea--- completed symptomatic management with azithromycin and only supportive care currently Underlying IBS Has a Flexi-Seal in need to liberate from this Symptomatic management only--try to avoid bulking agents but may need to start if she does not solidify soon Would give a day and keep Flexi-Seal in Uncontrolled diabetes mellitus with recent A1c 12 Monitor mentation as is on several meds continue gabapentin 800 3 times daily, continue Semglee  insulin  15 units and sensitive sliding scale Her home meds are confusing she is on both Tresiba as well as lispro Basaglar  so needs clarification Electrolyte abnormalities including elevated anion gap on admission Mild hyponatremia-relatively resolved Elevated anion gap-resolved Anxiety/depression Continues on Xanax  0.5 at bedtime as needed Elavil  25-50 for sleep and hydroxyzine  50 twice daily Does not appear to be taking any longer Wellbutrin  and BuSpar  Decubitus ulcers Will need reevaluation try to keep off bottom as best possible  Data Reviewed today:  WBC 17.7 hemoglobin 9.8 platelet 560 Sodium 131 chloride 93 BUN/creatinine 5/0.5 alk phos 212   DVT prophylaxis: Lovenox   Status is: Inpatient Inpatient pending eval by PT and postop management by surgery/Ortho    Dispo/Global plan: Unclear probably needs discussion about skilled care   Time 50   Subjective:   Flat affect-she says she is in moderate pain with the right leg and not finding a position of comfort [place] is on She has not eaten she has no chest pain The rash seems a little improved in her face per her  Objective + exam Vitals:   10/21/24 1245 10/21/24 1300 10/21/24 1312 10/21/24 1329  BP: (!) 142/84 134/81 136/82 136/88  Pulse: 72 72 73 74  Resp: 12 14 16 20   Temp:   98.2 F (36.8 C) 98.1 F (36.7 C)  TempSrc:    Oral  SpO2: 95% 94% 94% 94%  Weight:      Height:        Filed Weights   10/16/24 1832 10/17/24 1449 10/21/24 0954  Weight: 54.8 kg 63.5 kg 63.5 kg     Examination: EOMI NCAT disheveled white female no distress squamous/papular areas over hands slightly coarse Maller aspect of face bilaterally Neck soft supple S1-S2 no murmur No lower extremity edema Left foot examined and no slough well-healed around area of partial fifth ray amputation Power 5/5  Scheduled Meds:  acetaminophen   1,000 mg Oral Q6H   Chlorhexidine Gluconate Cloth  6 each Topical Daily   collagenase   Topical Daily   [START ON 10/22/2024] docusate sodium  100 mg Oral Daily   enoxaparin  (LOVENOX ) injection  40 mg Subcutaneous Q24H   feeding supplement  237 mL Oral BID BM   ferrous sulfate  325 mg Oral QODAY   gabapentin  800 mg Oral TID   hydrOXYzine   50 mg Oral BID   insulin  aspart  0-9 Units Subcutaneous Q4H   insulin  glargine-yfgn  15 Units Subcutaneous Daily   midodrine  10 mg Oral TID WC   nystatin  5 mL Oral QID   pantoprazole   40 mg Oral Daily   Continuous Infusions:  sodium chloride  40 mL/hr at 10/21/24 1407   ampicillin-sulbactam (UNASYN) IV 3 g (10/21/24 1415)   linezolid (ZYVOX) IV 600 mg (10/21/24 0531)   [START ON 10/22/2024] acetaminophen , albuterol, ALPRAZolam , amitriptyline , bisacodyl, dextrose, fentaNYL (SUBLIMAZE) injection, hydrALAZINE, HYDROmorphone  (DILAUDID ) injection, labetalol, loperamide, metoprolol tartrate, ondansetron , mouth rinse, oxyCODONE, oxyCODONE, polyethylene glycol, potassium chloride , tiZANidine  Jai-Gurmukh Bejamin Hackbart, MD  [facility]

## 2024-10-21 NOTE — Anesthesia Procedure Notes (Signed)
 Procedure Name: LMA Insertion Date/Time: 10/21/2024 10:55 AM  Performed by: Jolynn Mage, CRNAPre-anesthesia Checklist: Patient identified, Emergency Drugs available, Suction available and Patient being monitored Patient Re-evaluated:Patient Re-evaluated prior to induction Oxygen Delivery Method: Circle system utilized Preoxygenation: Pre-oxygenation with 100% oxygen Induction Type: IV induction Ventilation: Mask ventilation without difficulty LMA: LMA flexible inserted LMA Size: 4.0 Number of attempts: 1 Placement Confirmation: positive ETCO2 and breath sounds checked- equal and bilateral Tube secured with: Tape Dental Injury: Teeth and Oropharynx as per pre-operative assessment

## 2024-10-21 NOTE — Progress Notes (Signed)
 Patient refusing CHG bath and refusing to sign consent for surgery until she's talks to an MD.

## 2024-10-21 NOTE — Op Note (Signed)
 10/21/2024  11:38 AM  PATIENT:  Phyllis Pittman    PRE-OPERATIVE DIAGNOSIS:  Gangrene Right Foot  POST-OPERATIVE DIAGNOSIS:  Same  PROCEDURE:  AMPUTATION BELOW KNEE, APPLICATION, WOUND VAC Application of Kerecis micro graft 38 cm. Application of Prevena Peel and Place wound VAC dressings Application of Vive Wear stump shrinker and the [employer] limb protector  SURGEON:  Jerona LULLA Sage, MD  ANESTHESIA:   General  PREOPERATIVE INDICATIONS:  Phyllis Pittman is a  48 y.o. female with a diagnosis of Gangrene Right Foot who failed conservative measures and elected for surgical management.    The risks benefits and alternatives were discussed with the patient preoperatively including but not limited to the risks of infection, bleeding, nerve injury, cardiopulmonary complications, the need for revision surgery, among others, and the patient was willing to proceed.  OPERATIVE IMPLANTS:   Implant Name Type Inv. Item Serial No. Manufacturer Lot No. LRB No. Used Action  GRAFT SKIN WND MICRO 38 - [id number] Tissue GRAFT SKIN WND MICRO 38  [employer] [id number] Right 1 Implanted     OPERATIVE FINDINGS: Tissue margins were clear.  Muscle had good color and contractility.  OPERATIVE PROCEDURE: Patient was brought to the operating room after undergoing a regional anesthetic.  After adequate levels anesthesia were obtained a thigh tourniquet was placed and the lower extremity was prepped using DuraPrep draped into a sterile field. The foot was draped out of the sterile field with impervious stockinette.  A timeout was called and the tourniquet inflated.  A transverse skin incision was made 12 cm distal to the tibial tubercle, the incision curved proximally, and a large posterior flap was created.  The tibia was transected just proximal to the skin incision and beveled anteriorly.  The fibula was transected just proximal to the tibial incision.  The sciatic nerve was pulled cut and allowed to retract.   The vascular bundles were suture ligated with 2-0 silk.  The tourniquet was deflated and hemostasis obtained.  The wound was irrigated with Vashe.   The Kerecis micro powder 38 cm was applied to the open wound that has a 200 cm surface area.    The deep and superficial fascial layers were closed using #1 Vicryl.  The skin was closed using staples.    The Prevena Peel and Place dressing was applied this was overwrapped with Ioban.  This was connected to the wound VAC pump and had a good suction fit, this was covered with a stump shrinker and a limb protector.  Patient was taken to the PACU in stable condition.   DISCHARGE PLANNING:  Antibiotic duration: 24-hour antibiotics  Weightbearing: Nonweightbearing on the operative extremity  Pain medication: Opioid pathway  Dressing care/ Wound VAC: Continue wound VAC with the Prevena plus pump at discharge for 1 week  Ambulatory devices: Walker or kneeling scooter  Discharge to: Discharge planning based on recommendations per physical therapy  Follow-up: In the office 1 week after discharge.

## 2024-10-21 NOTE — Progress Notes (Signed)
 Orthopedic Tech Progress Note Patient Details:  Phyllis Pittman [birth year] [id number]  Called in order to [facility] for a VIVE PROTOCOL BKA   Patient ID: Phyllis Pittman, female   DOB: 1976-10-25, 48 y.o.   MRN: [id number]  Phyllis Pittman Pac 10/21/2024, 11:49 AM

## 2024-10-21 NOTE — Plan of Care (Signed)
 Problem: Education: Goal: Ability to describe self-care measures that may prevent or decrease complications (Diabetes Survival Skills Education) will improve Outcome: Progressing Goal: Individualized Educational Video(s) Outcome: Progressing   Problem: Coping: Goal: Ability to adjust to condition or change in health will improve Outcome: Progressing   Problem: Fluid Volume: Goal: Ability to maintain a balanced intake and output will improve Outcome: Progressing   Problem: Health Behavior/Discharge Planning: Goal: Ability to identify and utilize available resources and services will improve Outcome: Progressing Goal: Ability to manage health-related needs will improve Outcome: Progressing   Problem: Metabolic: Goal: Ability to maintain appropriate glucose levels will improve Outcome: Progressing   Problem: Nutritional: Goal: Maintenance of adequate nutrition will improve Outcome: Progressing Goal: Progress toward achieving an optimal weight will improve Outcome: Progressing   Problem: Skin Integrity: Goal: Risk for impaired skin integrity will decrease Outcome: Progressing   Problem: Tissue Perfusion: Goal: Adequacy of tissue perfusion will improve Outcome: Progressing   Problem: Education: Goal: Ability to describe self-care measures that may prevent or decrease complications (Diabetes Survival Skills Education) will improve Outcome: Progressing Goal: Individualized Educational Video(s) Outcome: Progressing   Problem: Cardiac: Goal: Ability to maintain an adequate cardiac output will improve Outcome: Progressing   Problem: Health Behavior/Discharge Planning: Goal: Ability to identify and utilize available resources and services will improve Outcome: Progressing Goal: Ability to manage health-related needs will improve Outcome: Progressing   Problem: Fluid Volume: Goal: Ability to achieve a balanced intake and output will improve Outcome: Progressing    Problem: Metabolic: Goal: Ability to maintain appropriate glucose levels will improve Outcome: Progressing   Problem: Nutritional: Goal: Maintenance of adequate nutrition will improve Outcome: Progressing Goal: Maintenance of adequate weight for body size and type will improve Outcome: Progressing   Problem: Respiratory: Goal: Will regain and/or maintain adequate ventilation Outcome: Progressing   Problem: Urinary Elimination: Goal: Ability to achieve and maintain adequate renal perfusion and functioning will improve Outcome: Progressing   Problem: Clinical Measurements: Goal: Ability to avoid or minimize complications of infection will improve Outcome: Progressing   Problem: Skin Integrity: Goal: Skin integrity will improve Outcome: Progressing   Problem: Education: Goal: Knowledge of General Education information will improve Description: Including pain rating scale, medication(s)/side effects and non-pharmacologic comfort measures Outcome: Progressing   Problem: Health Behavior/Discharge Planning: Goal: Ability to manage health-related needs will improve Outcome: Progressing   Problem: Clinical Measurements: Goal: Ability to maintain clinical measurements within normal limits will improve Outcome: Progressing Goal: Will remain free from infection Outcome: Progressing Goal: Diagnostic test results will improve Outcome: Progressing Goal: Respiratory complications will improve Outcome: Progressing Goal: Cardiovascular complication will be avoided Outcome: Progressing   Problem: Activity: Goal: Risk for activity intolerance will decrease Outcome: Progressing   Problem: Nutrition: Goal: Adequate nutrition will be maintained Outcome: Progressing   Problem: Coping: Goal: Level of anxiety will decrease Outcome: Progressing   Problem: Elimination: Goal: Will not experience complications related to bowel motility Outcome: Progressing Goal: Will not experience  complications related to urinary retention Outcome: Progressing   Problem: Pain Managment: Goal: General experience of comfort will improve and/or be controlled Outcome: Progressing   Problem: Safety: Goal: Ability to remain free from injury will improve Outcome: Progressing   Problem: Skin Integrity: Goal: Risk for impaired skin integrity will decrease Outcome: Progressing   Problem: Nutrition Goal: Patient maintains adequate hydration Outcome: Progressing Goal: Patient maintains weight Outcome: Progressing Goal: Patient/Family demonstrates understanding of diet Outcome: Progressing Goal: Patient/Family independently completes tube feeding Outcome: Progressing Goal: Patient will have  no more than 5 lb weight change during LOS Outcome: Progressing Goal: Patient will utilize adaptive techniques to administer nutrition Outcome: Progressing Goal: Patient will verbalize dietary restrictions Outcome: Progressing

## 2024-10-21 NOTE — Anesthesia Postprocedure Evaluation (Signed)
 Anesthesia Post Note  Patient: Phyllis Pittman  Procedure(s) Performed: AMPUTATION BELOW KNEE (Right: Knee) APPLICATION, WOUND VAC (Right: Leg Lower)     Patient location during evaluation: PACU Anesthesia Type: General Level of consciousness: awake and alert Pain management: pain level controlled Vital Signs Assessment: post-procedure vital signs reviewed and stable Respiratory status: spontaneous breathing, nonlabored ventilation, respiratory function stable and patient connected to nasal cannula oxygen Cardiovascular status: blood pressure returned to baseline and stable Postop Assessment: no apparent nausea or vomiting Anesthetic complications: no   No notable events documented.  Last Vitals:  Vitals:   10/21/24 1230 10/21/24 1245  BP: 137/77 (!) 142/84  Pulse: 73 72  Resp: (!) 22 12  Temp:    SpO2: 100% 95%    Last Pain:  Vitals:   10/21/24 1245  TempSrc:   PainSc: 0-No pain                 Karina Nofsinger

## 2024-10-21 NOTE — Transfer of Care (Signed)
 Immediate Anesthesia Transfer of Care Note  Patient: Phyllis Pittman  Procedure(s) Performed: AMPUTATION BELOW KNEE (Right: Knee) APPLICATION, WOUND VAC (Right: Leg Lower)  Patient Location: PACU  Anesthesia Type:General  Level of Consciousness: drowsy  Airway & Oxygen Therapy: Patient Spontanous Breathing and Patient connected to face mask oxygen  Post-op Assessment: Report given to RN and Post -op Vital signs reviewed and stable  Post vital signs: Reviewed and stable  Last Vitals:  Vitals Value Taken Time  BP 139/82 10/21/24 11:37  Temp    Pulse 73 10/21/24 11:39  Resp 18 10/21/24 11:39  SpO2 98 % 10/21/24 11:39  Vitals shown include unfiled device data.  Last Pain:  Vitals:   10/21/24 1017  TempSrc:   PainSc: 0-No pain      Patients Stated Pain Goal: 0 (10/20/24 1743)  Complications: No notable events documented.

## 2024-10-21 NOTE — Anesthesia Preprocedure Evaluation (Addendum)
 Anesthesia Evaluation  Patient identified by MRN, date of birth, ID band Patient awake    Reviewed: Allergy & Precautions, H&P , NPO status , Patient's Chart, lab work & pertinent test results  Airway Mallampati: II  TM Distance: >3 FB Neck ROM: Full    Dental no notable dental hx. (+) Poor Dentition   Pulmonary neg pulmonary ROS, sleep apnea , Current Smoker   Pulmonary exam normal breath sounds clear to auscultation       Cardiovascular Exercise Tolerance: Good hypertension, Pt. on medications negative cardio ROS Normal cardiovascular exam Rhythm:Regular Rate:Normal  ECHO 11/25 1. Left ventricular ejection fraction, by estimation, is 60 to 65%. The  left ventricle has normal function. The left ventricle has no regional  wall motion abnormalities. Left ventricular diastolic parameters were  normal.   2. Right ventricular systolic function is normal. The right ventricular  size is normal. There is normal pulmonary artery systolic pressure.   3. The mitral valve is normal in structure. No evidence of mitral valve  regurgitation. No evidence of mitral stenosis.   4. The aortic valve has an indeterminant number of cusps. Aortic valve  regurgitation is not visualized. No aortic stenosis is present.   5. The inferior vena cava is dilated in size with >50% respiratory  variability, suggesting right atrial pressure of 8 mmHg.      Neuro/Psych  Headaches PSYCHIATRIC DISORDERS Anxiety Depression     Neuromuscular disease CVA negative neurological ROS  negative psych ROS   GI/Hepatic negative GI ROS, Neg liver ROS,GERD  ,,  Endo/Other  negative endocrine ROSdiabetes, Insulin  Dependent    Renal/GU Renal diseasenegative Renal ROS  negative genitourinary   Musculoskeletal negative musculoskeletal ROS (+)  Fibromyalgia -  Abdominal   Peds negative pediatric ROS (+)  Hematology negative hematology ROS (+) Blood dyscrasia,  anemia   Anesthesia Other Findings   Reproductive/Obstetrics negative OB ROS                              Anesthesia Physical Anesthesia Plan  ASA: 4  Anesthesia Plan: General   Post-op Pain Management: Ofirmev  IV (intra-op)*   Induction: Intravenous  PONV Risk Score and Plan: 3 and Ondansetron  and Dexamethasone   Airway Management Planned: Oral ETT and LMA  Additional Equipment: None  Intra-op Plan:   Post-operative Plan: Extubation in OR  Informed Consent: I have reviewed the patients History and Physical, chart, labs and discussed the procedure including the risks, benefits and alternatives for the proposed anesthesia with the patient or authorized representative who has indicated his/her understanding and acceptance.       Plan Discussed with: Anesthesiologist and CRNA  Anesthesia Plan Comments: (  )         Anesthesia Quick Evaluation

## 2024-10-22 ENCOUNTER — Encounter (HOSPITAL_COMMUNITY): Payer: Self-pay | Admitting: Orthopedic Surgery

## 2024-10-22 DIAGNOSIS — M869 Osteomyelitis, unspecified: Secondary | ICD-10-CM | POA: Diagnosis not present

## 2024-10-22 LAB — BASIC METABOLIC PANEL WITH GFR
Anion gap: 9 (ref 5–15)
BUN: <5 mg/dL — ABNORMAL LOW (ref 6–20)
CO2: 26 mmol/L (ref 22–32)
Calcium: 7.3 mg/dL — ABNORMAL LOW (ref 8.9–10.3)
Chloride: 96 mmol/L — ABNORMAL LOW (ref 98–111)
Creatinine, Ser: 0.57 mg/dL (ref 0.44–1.00)
GFR, Estimated: >60 mL/min (ref >60)
Glucose, Bld: 109 mg/dL — ABNORMAL HIGH (ref 70–99)
Potassium: 3.6 mmol/L (ref 3.5–5.1)
Sodium: 131 mmol/L — ABNORMAL LOW (ref 135–145)

## 2024-10-22 LAB — GLUCOSE, CAPILLARY
Glucose-Capillary: 110 mg/dL — ABNORMAL HIGH (ref 70–99)
Glucose-Capillary: 113 mg/dL — ABNORMAL HIGH (ref 70–99)
Glucose-Capillary: 113 mg/dL — ABNORMAL HIGH (ref 70–99)
Glucose-Capillary: 132 mg/dL — ABNORMAL HIGH (ref 70–99)
Glucose-Capillary: 213 mg/dL — ABNORMAL HIGH (ref 70–99)
Glucose-Capillary: 229 mg/dL — ABNORMAL HIGH (ref 70–99)
Glucose-Capillary: 64 mg/dL — ABNORMAL LOW (ref 70–99)
Glucose-Capillary: 87 mg/dL (ref 70–99)

## 2024-10-22 LAB — CBC
HCT: 28.5 % — ABNORMAL LOW (ref 36.0–46.0)
Hemoglobin: 9.2 g/dL — ABNORMAL LOW (ref 12.0–15.0)
MCH: 25.6 pg — ABNORMAL LOW (ref 26.0–34.0)
MCHC: 32.3 g/dL (ref 30.0–36.0)
MCV: 79.4 fL — ABNORMAL LOW (ref 80.0–100.0)
Platelets: 533 K/uL — ABNORMAL HIGH (ref 150–400)
RBC: 3.59 MIL/uL — ABNORMAL LOW (ref 3.87–5.11)
RDW: 15.3 % (ref 11.5–15.5)
WBC: 15.9 K/uL — ABNORMAL HIGH (ref 4.0–10.5)
nRBC: 0 % (ref 0.0–0.2)

## 2024-10-22 LAB — MAGNESIUM: Magnesium: 1.5 mg/dL — ABNORMAL LOW (ref 1.7–2.4)

## 2024-10-22 MED ORDER — OXYCODONE HCL 5 MG PO TABS
15.0000 mg | ORAL_TABLET | ORAL | Status: DC | PRN
  Administered 2024-10-22 – 2024-10-23 (×2): 15 mg via ORAL
  Filled 2024-10-22 (×2): qty 3

## 2024-10-22 MED ORDER — SODIUM CHLORIDE 0.9 % IV BOLUS
500.0000 mL | Freq: Once | INTRAVENOUS | Status: AC
  Administered 2024-10-22: 500 mL via INTRAVENOUS

## 2024-10-22 MED ORDER — MAGNESIUM SULFATE 4 GM/100ML IV SOLN
4.0000 g | Freq: Once | INTRAVENOUS | Status: AC
  Administered 2024-10-22: 4 g via INTRAVENOUS
  Filled 2024-10-22: qty 100

## 2024-10-22 MED ORDER — ACETAMINOPHEN 500 MG PO TABS
1000.0000 mg | ORAL_TABLET | Freq: Four times a day (QID) | ORAL | Status: DC
  Administered 2024-10-22 – 2024-10-31 (×28): 1000 mg via ORAL
  Filled 2024-10-22 (×30): qty 2

## 2024-10-22 MED ORDER — INSULIN ASPART 100 UNIT/ML IJ SOLN: 2.0000 [IU] | Freq: Three times a day (TID) | INTRAMUSCULAR | Status: DC

## 2024-10-22 MED ORDER — HYDROMORPHONE HCL 1 MG/ML IJ SOLN
0.5000 mg | INTRAMUSCULAR | Status: DC | PRN
  Administered 2024-10-22 – 2024-10-23 (×4): 0.5 mg via INTRAVENOUS
  Filled 2024-10-22 (×4): qty 0.5

## 2024-10-22 MED ORDER — LOPERAMIDE HCL 2 MG PO CAPS
2.0000 mg | ORAL_CAPSULE | Freq: Three times a day (TID) | ORAL | Status: DC
  Administered 2024-10-22 – 2024-10-23 (×5): 2 mg via ORAL
  Filled 2024-10-22 (×5): qty 1

## 2024-10-22 MED ORDER — INSULIN GLARGINE-YFGN 100 UNIT/ML ~~LOC~~ SOLN
5.0000 [IU] | Freq: Every day | SUBCUTANEOUS | Status: DC
  Filled 2024-10-22: qty 0.05

## 2024-10-22 MED ORDER — INSULIN ASPART 100 UNIT/ML IJ SOLN
0.0000 [IU] | Freq: Three times a day (TID) | INTRAMUSCULAR | Status: DC
  Administered 2024-10-25: 7 [IU] via SUBCUTANEOUS
  Administered 2024-10-25: 5 [IU] via SUBCUTANEOUS
  Administered 2024-10-25 – 2024-10-26 (×2): 3 [IU] via SUBCUTANEOUS
  Administered 2024-10-26: 7 [IU] via SUBCUTANEOUS
  Administered 2024-10-26 – 2024-10-27 (×2): 5 [IU] via SUBCUTANEOUS
  Administered 2024-10-27: 7 [IU] via SUBCUTANEOUS
  Administered 2024-10-27 – 2024-10-28 (×3): 3 [IU] via SUBCUTANEOUS
  Filled 2024-10-22: qty 7
  Filled 2024-10-22 (×2): qty 3
  Filled 2024-10-22: qty 5
  Filled 2024-10-22: qty 3
  Filled 2024-10-22: qty 5
  Filled 2024-10-22: qty 3
  Filled 2024-10-22: qty 5
  Filled 2024-10-22 (×2): qty 7
  Filled 2024-10-22: qty 3

## 2024-10-22 MED ORDER — KETOCONAZOLE 2 % EX CREA
1.0000 | TOPICAL_CREAM | Freq: Two times a day (BID) | CUTANEOUS | Status: DC
  Administered 2024-10-22 – 2024-10-31 (×19): 1 via TOPICAL
  Filled 2024-10-22: qty 15

## 2024-10-22 NOTE — Evaluation (Signed)
 Physical Therapy Evaluation Patient Details Name: Phyllis Pittman MRN: [id number] DOB: 05/10/1976 Today's Date: 10/22/2024  History of Present Illness  Pt is a 48 y.o female admitted 10/31 for R foot wound. 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 PMH: HTN, sepsis, DM, HTN, HLD, OSA, CVA, fibromyalgia, neuropathy, sciatica, s/p L fifth toe amp, POTS  Clinical Impression  Pt admitted with above diagnosis. Pt was able to sit EOB x 10 min with CGA however even with max encouragement would not agree to transfer to chair.  Pt states she needs sleep and will sit up in bed.  Did leave in chair position at end of treatment.  REcommend post acute rehab > 3 hours day.  Pt currently with functional limitations due to the deficits listed below (see PT Problem List). Pt will benefit from acute skilled PT to increase their independence and safety with mobility to allow discharge.           If plan is discharge home, recommend the following: A little help with walking and/or transfers;A little help with bathing/dressing/bathroom;Assistance with cooking/housework;Assist for transportation   Can travel by private vehicle        Equipment Recommendations Other (comment) (TBA)  Recommendations for Other Services  Rehab consult    Functional Status Assessment Patient has had a recent decline in their functional status and demonstrates the ability to make significant improvements in function in a reasonable and predictable amount of time.     Precautions / Restrictions Precautions Precautions: Fall Recall of Precautions/Restrictions: Intact Precaution/Restrictions Comments: Watch BP, rectal tube Required Braces or Orthoses: Other Brace Other Brace: limb guard right residual limb Restrictions Weight Bearing Restrictions Per Provider Order: Yes RLE Weight Bearing Per Provider Order: Non weight bearing      Mobility  Bed Mobility Overal  bed mobility: Needs Assistance Bed Mobility: Supine to Sit, Sit to Supine     Supine to sit: Contact guard, HOB elevated, Used rails Sit to supine: Contact guard assist, HOB elevated, Used rails   General bed mobility comments: CGA for line management.Incr time for pt to come to EOB and she asked PT not to assist.    Transfers                   General transfer comment: not tested    Ambulation/Gait                  Stairs            Wheelchair Mobility     Tilt Bed    Modified Rankin (Stroke Patients Only)       Balance Overall balance assessment: Needs assistance Sitting-balance support: No upper extremity supported, Feet supported, Bilateral upper extremity supported, Single extremity supported Sitting balance-Leahy Scale: Fair Sitting balance - Comments: Sat EOB for up to 10 min.  VSS. Pt refused to transfer to chair with max encouragement. Pt leaning left most of time at EOB. Postural control: Left lateral lean                                   Pertinent Vitals/Pain Pain Assessment Pain Assessment: Faces Faces Pain Scale: Hurts even more Pain Location: RLE Pain Descriptors / Indicators: Grimacing, Guarding Pain Intervention(s): Limited activity within patient's tolerance, Monitored during session, Repositioned, Premedicated before session    Home Living Family/patient  expects to be discharged to:: Private residence Living Arrangements: Children   Type of Home: House Home Access: Level entry       Home Layout: One level Home Equipment: Agricultural Consultant (2 wheels);Cane - single point;Rollator (4 wheels);BSC/3in1;Shower seat      Prior Function Prior Level of Function : Independent/Modified Independent             Mobility Comments: Ind use of RW, denies falls ADLs Comments: Ind with ADLs     Extremity/Trunk Assessment   Upper Extremity Assessment Upper Extremity Assessment: Defer to OT evaluation    Lower  Extremity Assessment Lower Extremity Assessment: RLE deficits/detail;LLE deficits/detail RLE Deficits / Details: residual limb hip movmt WFL, knee NT due to pain, limb guard in place LLE Deficits / Details: ankle 2/5, knee 2+/5, hip 2/5    Cervical / Trunk Assessment Cervical / Trunk Assessment: Kyphotic  Communication   Communication Communication: No apparent difficulties    Cognition Arousal: Alert Behavior During Therapy: Flat affect   PT - Cognitive impairments: Safety/Judgement, Problem solving                         Following commands: Intact       Cueing Cueing Techniques: Verbal cues, Tactile cues     General Comments      Exercises     Assessment/Plan    PT Assessment Patient needs continued PT services  PT Problem List Decreased activity tolerance;Decreased balance;Decreased mobility;Decreased knowledge of use of DME;Decreased safety awareness;Decreased knowledge of precautions;Cardiopulmonary status limiting activity       PT Treatment Interventions DME instruction;Gait training;Functional mobility training;Therapeutic activities;Therapeutic exercise;Balance training;Patient/family education;Wheelchair mobility training    PT Goals (Current goals can be found in the Care Plan section)  Acute Rehab PT Goals Patient Stated Goal: to go home PT Goal Formulation: With patient Time For Goal Achievement: 11/05/24 Potential to Achieve Goals: Good    Frequency Min 3X/week     Co-evaluation               AM-PAC PT 6 Clicks Mobility  Outcome Measure Help needed turning from your back to your side while in a flat bed without using bedrails?: None Help needed moving from lying on your back to sitting on the side of a flat bed without using bedrails?: None Help needed moving to and from a bed to a chair (including a wheelchair)?: Total Help needed standing up from a chair using your arms (e.g., wheelchair or bedside chair)?: Total Help  needed to walk in hospital room?: Total Help needed climbing 3-5 steps with a railing? : Total 6 Click Score: 12    End of Session Equipment Utilized During Treatment: Gait belt Activity Tolerance: Patient tolerated treatment well Patient left: in bed;with call bell/phone within reach;with bed alarm set Nurse Communication: Mobility status PT Visit Diagnosis: Muscle weakness (generalized) (M62.81)    Time: 1035-1100 PT Time Calculation (min) (ACUTE ONLY): 25 min   Charges:   PT Evaluation $PT Eval Moderate Complexity: 1 Mod PT Treatments $Therapeutic Activity: 8-22 mins PT General Charges $$ ACUTE PT VISIT: 1 Visit         Laveyah Oriol M,PT Acute Rehab Services [phone]   Stephane JULIANNA Bevel 10/22/2024, 2:03 PM

## 2024-10-22 NOTE — Plan of Care (Signed)
  Problem: Education: Goal: Ability to describe self-care measures that may prevent or decrease complications (Diabetes Survival Skills Education) will improve Outcome: Progressing   Problem: Respiratory: Goal: Will regain and/or maintain adequate ventilation Outcome: Progressing   Problem: Clinical Measurements: Goal: Respiratory complications will improve Outcome: Progressing   Problem: Coping: Goal: Level of anxiety will decrease Outcome: Progressing

## 2024-10-22 NOTE — Progress Notes (Signed)
 [facility]   ROUNDING   NOTE Keosha Rossa FMW:[id number]  DOB: 1976-02-26  DOA: 10/16/2024  PCP: [name], PA  10/22/2024,1:15 PM  LOS: 6 days    Code Status: Full code     from: Home   48 year old female with poorly controlled diabetes mellitus HTN HLD diabetic neuropathy Has had diabetes for about 20 years been on insulin  for about 5  Admitted for diabetic foot wound infection  Chronology  [facility] evaluation cellulitis abscess concerning for soft tissue infection of left foot underwent I&D and partial fifth ray amputation 716 and was treated for strep agalactiae and a PICC line was placed and OPAT was performed-A1c was 12.2 she was told to follow-up in wound care center 10/31 represent to [facility] Long from wound care center after several visits to wound care office and being told to go to ED-found to be lethargic finally on 10/29 foul-smelling wound etc. etc.-started on antibiotics 11/1 because of diarrhea tested positive for EPEC and treated 11/2 infectious disease consulted 11/5 right-sided BKA Dr. Harden   Assessment  & Plan :    Sepsis on admission secondary to diabetic foot infection with gangrene on admission Surgical cure-ID recommends antibiotics  Unasyn and linezolid vancomycin through 11/6 and then stop Pain control sched tylenol  100 q6, Oxy IR 15 [is on opiates at home], dilaudid  IV for severe---she understands we have to wean IV prior to SNF d/c---IV pain meds also dropping BP Continue tizanidine for at bedtime additionally--continue gabapentin 800 3 times daily, She lives with her aunt and daughter in [place] but is from [place]-T[facility] to look for SNF Dermatitis This was facial cellulitis?  Resumed topical ketoconazole Can be followed by dermatology as an outpatient POTS syndrome Continues midodrine 10 3 times daily meals for hypotension Only PRNs now an IV for blood pressure/elevated heart rate  EPEC diarrhea--- completed symptomatic management with  azithromycin and only supportive care currently Underlying IBS Has a Flexi-Seal in need to liberate from this Chronic long-standing issue--increase imodium to 2 mg tid scheduled and see effect Pull Flexi-seal when able Uncontrolled diabetes mellitus with recent A1c 12 continue Semglee  insulin  15 units and sensitive sliding scale Adding mealtime 2 U  Electrolyte abnormalities including elevated anion gap on admission Mild hyponatremia-relatively resolved Elevated anion gap-resolved Anxiety/depression Continues on Xanax  0.5 --understands only limited Rx at d/c hydroxyzine  50 twice daily Does not appear to be taking any longer Wellbutrin  and BuSpar --we stopped night time elevail Decubitus ulcers Will need reevaluation try to keep off bottom as best possible Offload and OOB as able  Data Reviewed today:   Na 131 k 3.6 cl 96 bun/cr 5/0.57 WBC 15.9 Hb 9.2 PLT 533  DVT prophylaxis: Lovenox   Status is: Inpatient Inpatient pending eval by PT and postop management by surgery/Ortho    Dispo/Global plan: Unclear probably needs discussion about skilled care   Time 50   Subjective:   Pain 10/10 No fever chills--no n/v.  Still with flexiseal and diarr  Objective + exam Vitals:   10/22/24 0342 10/22/24 0400 10/22/24 0600 10/22/24 1200  BP: 92/60 95/65 (!) 82/49 93/61  Pulse: 83 80  80  Resp: 20 17 10 12   Temp: 98.1 F (36.7 C)   97.8 F (36.6 C)  TempSrc: Oral   Oral  SpO2: 98% 98%  94%  Weight:      Height:       Filed Weights   10/16/24 1832 10/17/24 1449 10/21/24 0954  Weight: 54.8 kg 63.5 kg  63.5 kg     Examination: EOMI NCAT disheveled white female no distress squamous/papular areas over hands Neck soft supple S1-S2 no murmur No lower extremity edema L foot and sacral exam deferred  Scheduled Meds:  acetaminophen   1,000 mg Oral Q6H   Chlorhexidine Gluconate Cloth  6 each Topical Daily   collagenase   Topical Daily   enoxaparin  (LOVENOX ) injection  40 mg  Subcutaneous Q24H   feeding supplement  237 mL Oral BID BM   ferrous sulfate  325 mg Oral QODAY   gabapentin  800 mg Oral TID   hydrOXYzine   50 mg Oral BID   insulin  aspart  0-9 Units Subcutaneous TID WC   insulin  aspart  2 Units Subcutaneous TID WC   insulin  glargine-yfgn  15 Units Subcutaneous Daily   ketoconazole  1 Application Topical BID   loperamide  2 mg Oral TID   midodrine  10 mg Oral TID WC   nystatin  5 mL Oral QID   pantoprazole   40 mg Oral Daily   Continuous Infusions:  sodium chloride  Stopped (10/22/24 0859)   ampicillin-sulbactam (UNASYN) IV 3 g (10/22/24 1015)   linezolid (ZYVOX) IV 600 mg (10/22/24 0600)   magnesium sulfate bolus IVPB 4 g (10/22/24 1154)   albuterol, ALPRAZolam , dextrose, hydrALAZINE, HYDROmorphone  (DILAUDID ) injection, labetalol, metoprolol tartrate, ondansetron , mouth rinse, oxyCODONE, potassium chloride , tiZANidine  Jai-Gurmukh Ragen Laver, MD  [facility]

## 2024-10-22 NOTE — Progress Notes (Signed)
 Pt's blood sugar dropped to 69. Apple juice and graham crackers provided to pt. MD, Samtani, notified.

## 2024-10-22 NOTE — Plan of Care (Signed)
 Problem: Education: Goal: Ability to describe self-care measures that may prevent or decrease complications (Diabetes Survival Skills Education) will improve Outcome: Progressing Goal: Individualized Educational Video(s) Outcome: Progressing   Problem: Coping: Goal: Ability to adjust to condition or change in health will improve Outcome: Progressing   Problem: Fluid Volume: Goal: Ability to maintain a balanced intake and output will improve Outcome: Progressing   Problem: Health Behavior/Discharge Planning: Goal: Ability to identify and utilize available resources and services will improve Outcome: Progressing Goal: Ability to manage health-related needs will improve Outcome: Progressing   Problem: Metabolic: Goal: Ability to maintain appropriate glucose levels will improve Outcome: Progressing   Problem: Nutritional: Goal: Maintenance of adequate nutrition will improve Outcome: Progressing Goal: Progress toward achieving an optimal weight will improve Outcome: Progressing   Problem: Skin Integrity: Goal: Risk for impaired skin integrity will decrease Outcome: Progressing   Problem: Tissue Perfusion: Goal: Adequacy of tissue perfusion will improve Outcome: Progressing   Problem: Education: Goal: Ability to describe self-care measures that may prevent or decrease complications (Diabetes Survival Skills Education) will improve Outcome: Progressing Goal: Individualized Educational Video(s) Outcome: Progressing   Problem: Cardiac: Goal: Ability to maintain an adequate cardiac output will improve Outcome: Progressing   Problem: Health Behavior/Discharge Planning: Goal: Ability to identify and utilize available resources and services will improve Outcome: Progressing Goal: Ability to manage health-related needs will improve Outcome: Progressing   Problem: Fluid Volume: Goal: Ability to achieve a balanced intake and output will improve Outcome: Progressing    Problem: Metabolic: Goal: Ability to maintain appropriate glucose levels will improve Outcome: Progressing   Problem: Nutritional: Goal: Maintenance of adequate nutrition will improve Outcome: Progressing Goal: Maintenance of adequate weight for body size and type will improve Outcome: Progressing   Problem: Respiratory: Goal: Will regain and/or maintain adequate ventilation Outcome: Progressing   Problem: Urinary Elimination: Goal: Ability to achieve and maintain adequate renal perfusion and functioning will improve Outcome: Progressing   Problem: Clinical Measurements: Goal: Ability to avoid or minimize complications of infection will improve Outcome: Progressing   Problem: Skin Integrity: Goal: Skin integrity will improve Outcome: Progressing   Problem: Education: Goal: Knowledge of General Education information will improve Description: Including pain rating scale, medication(s)/side effects and non-pharmacologic comfort measures Outcome: Progressing   Problem: Health Behavior/Discharge Planning: Goal: Ability to manage health-related needs will improve Outcome: Progressing   Problem: Clinical Measurements: Goal: Ability to maintain clinical measurements within normal limits will improve Outcome: Progressing Goal: Will remain free from infection Outcome: Progressing Goal: Diagnostic test results will improve Outcome: Progressing Goal: Respiratory complications will improve Outcome: Progressing Goal: Cardiovascular complication will be avoided Outcome: Progressing   Problem: Activity: Goal: Risk for activity intolerance will decrease Outcome: Progressing   Problem: Nutrition: Goal: Adequate nutrition will be maintained Outcome: Progressing   Problem: Coping: Goal: Level of anxiety will decrease Outcome: Progressing   Problem: Elimination: Goal: Will not experience complications related to bowel motility Outcome: Progressing Goal: Will not experience  complications related to urinary retention Outcome: Progressing   Problem: Pain Managment: Goal: General experience of comfort will improve and/or be controlled Outcome: Progressing   Problem: Safety: Goal: Ability to remain free from injury will improve Outcome: Progressing   Problem: Skin Integrity: Goal: Risk for impaired skin integrity will decrease Outcome: Progressing   Problem: Nutrition Goal: Patient maintains adequate hydration Outcome: Progressing Goal: Patient maintains weight Outcome: Progressing Goal: Patient/Family demonstrates understanding of diet Outcome: Progressing Goal: Patient/Family independently completes tube feeding Outcome: Progressing Goal: Patient will have  no more than 5 lb weight change during LOS Outcome: Progressing Goal: Patient will utilize adaptive techniques to administer nutrition Outcome: Progressing Goal: Patient will verbalize dietary restrictions Outcome: Progressing

## 2024-10-22 NOTE — Progress Notes (Signed)
 Pt resting comfortably call bell in reach with bed alarm set.

## 2024-10-22 NOTE — Progress Notes (Signed)
 Patient ID: Phyllis Pittman, female   DOB: 08-Oct-1976, 48 y.o.   MRN: [id number] Patient is postoperative day 1 transtibial amputation on the right.  There is no drainage in the wound VAC canister there is a good suction fit.  Anticipate patient will need discharge to skilled nursing.  She will discharge with the Prevena plus portable wound VAC pump

## 2024-10-22 NOTE — Evaluation (Signed)
 Occupational Therapy Evaluation Patient Details Name: Phyllis Pittman MRN: [id number] DOB: 1976-09-17 Today's Date: 10/22/2024   History of Present Illness   Pt is a 48 y.o female admitted 10/31 for R foot wound. 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 PMH: HTN, sepsis, DM, HTN, HLD, OSA, CVA, fibromyalgia, neuropathy, sciatica, s/p L fifth toe amp, POTS     Clinical Impressions Pt admitted based on above, and was seen based on problem list below. PTA pt was independent with ADLs and IADLs. Today pt is requiring set up  to mod assist for ADLs. Bed mobility was  CGA for line management. Pt limited d/t soft bp and dizziness, requiring pt to return to supine. Anticipate pt would do well and greatly benefit from >3 hours of skilled rehab daily. OT will continue to follow acutely to maximize functional independence.     If plan is discharge home, recommend the following:   A lot of help with walking and/or transfers;A lot of help with bathing/dressing/bathroom;Assistance with cooking/housework     Functional Status Assessment   Patient has had a recent decline in their functional status and demonstrates the ability to make significant improvements in function in a reasonable and predictable amount of time.     Equipment Recommendations   Other (comment) (Defer to next venue)     Recommendations for Other Services   Rehab consult     Precautions/Restrictions   Precautions Precautions: Fall Recall of Precautions/Restrictions: Intact Precaution/Restrictions Comments: Watch BP, rectal tube Restrictions Weight Bearing Restrictions Per Provider Order: Yes RLE Weight Bearing Per Provider Order: Non weight bearing     Mobility Bed Mobility Overal bed mobility: Needs Assistance Bed Mobility: Supine to Sit, Sit to Supine     Supine to sit: Contact guard, HOB elevated, Used rails Sit to supine: Contact guard  assist, HOB elevated, Used rails   General bed mobility comments: CGA for line management.    Transfers     General transfer comment: not tested      Balance Overall balance assessment: Needs assistance Sitting-balance support: No upper extremity supported, Feet supported Sitting balance-Leahy Scale: Fair         ADL either performed or assessed with clinical judgement   ADL Overall ADL's : Needs assistance/impaired Eating/Feeding: Set up;Sitting   Grooming: Set up;Sitting   Upper Body Bathing: Set up;Sitting   Lower Body Bathing: Moderate assistance   Upper Body Dressing : Set up;Sitting   Lower Body Dressing: Moderate assistance     General ADL Comments: Limited d/t soft BP and dizziness     Vision Baseline Vision/History: 1 Wears glasses Patient Visual Report: No change from baseline Vision Assessment?: No apparent visual deficits            Pertinent Vitals/Pain Pain Assessment Pain Assessment: 0-10 Pain Score: 8  Pain Location: RLE Pain Descriptors / Indicators: Grimacing, Guarding Pain Intervention(s): Limited activity within patient's tolerance     Extremity/Trunk Assessment Upper Extremity Assessment Upper Extremity Assessment: Generalized weakness   Lower Extremity Assessment Lower Extremity Assessment: Defer to PT evaluation       Communication Communication Communication: No apparent difficulties   Cognition Arousal: Lethargic Behavior During Therapy: Flat affect Cognition: No apparent impairments     Following commands: Intact       Cueing  General Comments   Cueing Techniques: Verbal cues  Supine BP  99/64 sitting BP 91/65  Home Living Family/patient expects to be discharged to:: Private residence Living Arrangements: Children Available Help at Discharge: Family;Available PRN/intermittently Type of Home: House Home Access: Level entry     Home Layout: One level     Bathroom Shower/Tub: Multimedia Programmer: Standard     Home Equipment: Agricultural Consultant (2 wheels);Cane - single point;Rollator (4 wheels);BSC/3in1;Shower seat          Prior Functioning/Environment Prior Level of Function : Independent/Modified Independent             Mobility Comments: Ind use of RW, denies falls ADLs Comments: Ind with ADLs    OT Problem List: Decreased strength;Decreased range of motion;Decreased activity tolerance;Impaired balance (sitting and/or standing);Decreased safety awareness;Decreased knowledge of use of DME or AE;Cardiopulmonary status limiting activity   OT Treatment/Interventions: Self-care/ADL training;Therapeutic exercise;Energy conservation;DME and/or AE instruction;Therapeutic activities;Patient/family education;Balance training      OT Goals(Current goals can be found in the care plan section)   Acute Rehab OT Goals Patient Stated Goal: To lay down OT Goal Formulation: With patient Time For Goal Achievement: 11/05/24 Potential to Achieve Goals: Good   OT Frequency:  Min 2X/week       AM-PAC OT 6 Clicks Daily Activity     Outcome Measure Help from another person eating meals?: None Help from another person taking care of personal grooming?: A Little Help from another person toileting, which includes using toliet, bedpan, or urinal?: Total Help from another person bathing (including washing, rinsing, drying)?: A Lot Help from another person to put on and taking off regular upper body clothing?: A Little Help from another person to put on and taking off regular lower body clothing?: A Lot 6 Click Score: 15   End of Session Nurse Communication: Mobility status  Activity Tolerance: Patient limited by fatigue Patient left: in bed;with call bell/phone within reach;with bed alarm set  OT Visit Diagnosis: Other abnormalities of gait and mobility (R26.89);Unsteadiness on feet (R26.81);Muscle weakness (generalized) (M62.81)                Time:  9247-9179 OT Time Calculation (min): 28 min Charges:  OT General Charges $OT Visit: 1 Visit OT Evaluation $OT Eval Moderate Complexity: 1 Mod OT Treatments $Self Care/Home Management : 8-22 mins  Adrianne BROCKS, OT  Acute Rehabilitation Services Office [phone] Secure chat preferred   Adrianne GORMAN Savers 10/22/2024, 10:03 AM

## 2024-10-22 NOTE — Progress Notes (Signed)
 Inpatient Rehab Admissions Coordinator:   CIR consult received. Note that she was not agreeable to attempt OOB during today's sessions. She will need to demonstrate increased participation with therapies. Will follow and see how she does in her next therapy sessions.  Leita Kleine, MS, CCC-SLP Rehab Admissions Coordinator  [phone] (celll) [phone] (office)

## 2024-10-22 NOTE — Progress Notes (Signed)
 Pt's BP running low SBP high 80s-90s, pt asymptomatic and stated that this often happens and that she has POTS and is on midodrine. Was given several meds that may have impacted BP as well this shift such as (dilaudid  , xanax  , gabapentin, atarax ). Provider on call aware and states to avoid opioids and BP meds at this time if applicable. See MAR for related vitals.

## 2024-10-23 ENCOUNTER — Inpatient Hospital Stay (HOSPITAL_COMMUNITY)

## 2024-10-23 ENCOUNTER — Encounter: Payer: Self-pay | Admitting: Family Medicine

## 2024-10-23 DIAGNOSIS — M869 Osteomyelitis, unspecified: Secondary | ICD-10-CM | POA: Diagnosis not present

## 2024-10-23 LAB — CBC
HCT: 27.9 % — ABNORMAL LOW (ref 36.0–46.0)
Hemoglobin: 8.7 g/dL — ABNORMAL LOW (ref 12.0–15.0)
MCH: 25.3 pg — ABNORMAL LOW (ref 26.0–34.0)
MCHC: 31.2 g/dL (ref 30.0–36.0)
MCV: 81.1 fL (ref 80.0–100.0)
Platelets: 577 K/uL — ABNORMAL HIGH (ref 150–400)
RBC: 3.44 MIL/uL — ABNORMAL LOW (ref 3.87–5.11)
RDW: 15.3 % (ref 11.5–15.5)
WBC: 12.5 K/uL — ABNORMAL HIGH (ref 4.0–10.5)
nRBC: 0 % (ref 0.0–0.2)

## 2024-10-23 LAB — GLUCOSE, CAPILLARY
Glucose-Capillary: 64 mg/dL — ABNORMAL LOW (ref 70–99)
Glucose-Capillary: 65 mg/dL — ABNORMAL LOW (ref 70–99)
Glucose-Capillary: 107 mg/dL — ABNORMAL HIGH (ref 70–99)
Glucose-Capillary: 118 mg/dL — ABNORMAL HIGH (ref 70–99)
Glucose-Capillary: 75 mg/dL (ref 70–99)
Glucose-Capillary: 208 mg/dL — ABNORMAL HIGH (ref 70–99)

## 2024-10-23 LAB — BASIC METABOLIC PANEL WITH GFR
Anion gap: 8 (ref 5–15)
BUN: <5 mg/dL — ABNORMAL LOW (ref 6–20)
CO2: 28 mmol/L (ref 22–32)
Calcium: 7.3 mg/dL — ABNORMAL LOW (ref 8.9–10.3)
Chloride: 95 mmol/L — ABNORMAL LOW (ref 98–111)
Creatinine, Ser: 0.43 mg/dL — ABNORMAL LOW (ref 0.44–1.00)
GFR, Estimated: >60 mL/min (ref >60)
Glucose, Bld: 69 mg/dL — ABNORMAL LOW (ref 70–99)
Potassium: 4.1 mmol/L (ref 3.5–5.1)
Sodium: 131 mmol/L — ABNORMAL LOW (ref 135–145)

## 2024-10-23 LAB — MAGNESIUM: Magnesium: 2.1 mg/dL (ref 1.7–2.4)

## 2024-10-23 LAB — SURGICAL PATHOLOGY

## 2024-10-23 MED ORDER — HYDROMORPHONE HCL 2 MG PO TABS
3.0000 mg | ORAL_TABLET | ORAL | Status: DC | PRN
Start: 2024-10-23 — End: 2024-10-25
  Administered 2024-10-23 – 2024-10-25 (×5): 3 mg via ORAL
  Filled 2024-10-23 (×5): qty 2

## 2024-10-23 MED ORDER — TIZANIDINE HCL 4 MG PO TABS
4.0000 mg | ORAL_TABLET | Freq: Four times a day (QID) | ORAL | Status: DC | PRN
Start: 2024-10-23 — End: 2024-11-01
  Administered 2024-10-30: 4 mg via ORAL
  Filled 2024-10-23: qty 1

## 2024-10-23 MED ORDER — LOPERAMIDE HCL 2 MG PO CAPS
2.0000 mg | ORAL_CAPSULE | Freq: Four times a day (QID) | ORAL | Status: DC
  Administered 2024-10-23 – 2024-10-27 (×11): 2 mg via ORAL
  Filled 2024-10-23 (×13): qty 1

## 2024-10-23 MED ORDER — HYDROMORPHONE HCL 1 MG/ML IJ SOLN
0.5000 mg | Freq: Three times a day (TID) | INTRAMUSCULAR | Status: DC | PRN
  Administered 2024-10-28 – 2024-10-29 (×2): 0.5 mg via SUBCUTANEOUS
  Filled 2024-10-23 (×2): qty 0.5

## 2024-10-23 NOTE — Progress Notes (Signed)
 Morning CBG 65.  8oz juice given.  Pt. Asymptomatic.

## 2024-10-23 NOTE — NC FL2 (Signed)
 [place]  MEDICAID [id number] LEVEL OF CARE FORM     IDENTIFICATION  Patient Name: Courtney Bellizzi Birthdate: 08-Jul-1976 Sex: female Admission Date (Current Location): 10/16/2024  [facility] and [place] Number:  Producer, Television/film/video and Address:  [facility], [address], [place], [place] [address]      Provider Number: [id number]  Attending Physician Name and Address:  Royal Sill, MD  Relative Name and Phone Number:  Jerrye Clarity [phone])  [phone] [facility])    Current Level of Care: Hospital Recommended Level of Care: Skilled Nursing Facility Prior Approval Number:    Date Approved/Denied:   PASRR Number: Pending  Discharge Plan: SNF    Current Diagnoses: Patient Active Problem List   Diagnosis Date Noted   Cutaneous abscess of right foot 10/19/2024   Gangrene of right foot (HCC) 10/19/2024   Facial dermatitis 10/19/2024   Diarrhea of infectious origin 10/19/2024   Acute osteomyelitis of phalanx of right foot (HCC) 10/18/2024   Osteomyelitis (HCC) 10/16/2024   Pyelonephritis 08/12/2021   Sepsis (HCC) 08/12/2021   Lactic acidosis 08/12/2021   Hyponatremia 08/12/2021   Hypokalemia 08/12/2021   Hyperglycemia 08/12/2021   T2DM (type 2 diabetes mellitus) (HCC) 08/12/2021   HTN (hypertension) 08/12/2021   HLD (hyperlipidemia) 08/12/2021   Anxiety and depression 08/12/2021    Orientation RESPIRATION BLADDER Height & Weight     Self, Time, Situation, Place  Normal Continent Weight: 139 lb 15.9 oz (63.5 kg) Height:  5' (152.4 cm)  BEHAVIORAL SYMPTOMS/MOOD NEUROLOGICAL BOWEL NUTRITION STATUS      Continent Diet (See discharge summary)  AMBULATORY STATUS COMMUNICATION OF NEEDS Skin   Limited Assist Verbally Surgical wounds, Other (Comment), Wound Vac (RIght lower leg amputation(closed surgical incision, WOUND VAC applied), upper buttocks pressure injury, Medial lower sacrum pressure injury, anterior left lateral posterior foot diabetic  ulcer, irritant contact dermatitis on right left labia)                       Personal Care Assistance Level of Assistance  Bathing, Dressing, Feeding Bathing Assistance: Limited assistance Feeding assistance: Independent Dressing Assistance: Limited assistance     Functional Limitations Info  Sight, Hearing, Speech Sight Info: Impaired (Impaired Vision) Hearing Info: Adequate Speech Info: Adequate    SPECIAL CARE FACTORS FREQUENCY  PT (By licensed PT), OT (By licensed OT)     PT Frequency: min 5x a week OT Frequency: min 5x a week            Contractures Contractures Info: Not present    Additional Factors Info  Code Status, Allergies, Psychotropic, Insulin  Sliding Scale Code Status Info: Full Allergies Info: Acyclovir, Dust mite extract, molds and smuts, other-dust,mold,powder, rednisone, venlafaxine, cephalexin , dulaglutide, latex, levofloxacin, metformin, semaglutide, sulfa antibiotics, codeine, famotidine Psychotropic Info: gabapentin (NEURONTIN) capsule 800 mg, hydrOXYzine  (ATARAX ) tablet 50 mg Insulin  Sliding Scale Info: insulin  aspart (novoLOG ) injection 0-9 Units- see discharge summary       Current Medications (10/23/2024):  This is the current hospital active medication list Current Facility-Administered Medications  Medication Dose Route Frequency Provider Last Rate Last Admin   acetaminophen  (TYLENOL ) tablet 1,000 mg  1,000 mg Oral Q6H Samtani, Jai-Gurmukh, MD   1,000 mg at 10/23/24 1212   albuterol (PROVENTIL) (2.5 MG/3ML) 0.083% nebulizer solution 2.5 mg  2.5 mg Nebulization Q2H PRN Gerome Herring M, PA-C       ALPRAZolam  (XANAX ) tablet 0.5 mg  0.5 mg Oral QHS PRN Gerome Herring HERO, PA-C   0.5  mg at 10/22/24 2013   Chlorhexidine Gluconate Cloth 2 % PADS 6 each  6 each Topical Daily Gerome Maurilio HERO, PA-C   6 each at 10/23/24 0848   collagenase (SANTYL) ointment   Topical Daily Gerome Maurilio HERO, [place]   Given at 10/23/24 0850   dextrose 50 % solution 0-50  mL  0-50 mL Intravenous PRN Gerome Maurilio M, PA-C       enoxaparin  (LOVENOX ) injection 40 mg  40 mg Subcutaneous Q24H Gerome Maurilio M, PA-C   40 mg at 10/22/24 2011   feeding supplement (ENSURE PLUS HIGH PROTEIN) liquid 237 mL  237 mL Oral BID BM Gerome Maurilio HERO, PA-C   237 mL at 10/22/24 1007   ferrous sulfate tablet 325 mg  325 mg Oral QODAY Collins, Emma M, PA-C   325 mg at 10/23/24 0840   gabapentin (NEURONTIN) capsule 800 mg  800 mg Oral TID Gerome Maurilio HERO, PA-C   800 mg at 10/23/24 1515   hydrALAZINE (APRESOLINE) injection 5 mg  5 mg Intravenous Q20 Min PRN Gerome Maurilio M, PA-C       HYDROmorphone  (DILAUDID ) injection 0.5 mg  0.5 mg Subcutaneous Q8H PRN Samtani, Jai-Gurmukh, MD       HYDROmorphone  (DILAUDID ) tablet 3 mg  3 mg Oral Q4H PRN Samtani, Jai-Gurmukh, MD       hydrOXYzine  (ATARAX ) tablet 50 mg  50 mg Oral BID Gerome Maurilio M, PA-C   50 mg at 10/23/24 0840   insulin  aspart (novoLOG ) injection 0-9 Units  0-9 Units Subcutaneous TID WC Samtani, Jai-Gurmukh, MD       ketoconazole (NIZORAL) 2 % cream 1 Application  1 Application Topical BID Samtani, Jai-Gurmukh, MD   1 Application at 10/23/24 0850   labetalol (NORMODYNE) injection 10 mg  10 mg Intravenous Q10 min PRN Gerome Maurilio M, PA-C       loperamide (IMODIUM) capsule 2 mg  2 mg Oral QID Samtani, Jai-Gurmukh, MD       metoprolol tartrate (LOPRESSOR) injection 2-5 mg  2-5 mg Intravenous Q2H PRN Gerome Maurilio M, PA-C       midodrine (PROAMATINE) tablet 10 mg  10 mg Oral TID WC Collins, Emma M, PA-C   10 mg at 10/23/24 1212   nystatin (MYCOSTATIN) 100000 UNIT/ML suspension 500,000 Units  5 mL Oral QID Gerome Maurilio HERO, PA-C   500,000 Units at 10/22/24 2121   ondansetron  (ZOFRAN ) injection 4 mg  4 mg Intravenous Q6H PRN Gerome Maurilio HERO, PA-C       Oral care mouth rinse  15 mL Mouth Rinse PRN Gerome Maurilio M, PA-C       pantoprazole  (PROTONIX ) EC tablet 40 mg  40 mg Oral Daily Gerome Maurilio M, PA-C   40 mg at 10/23/24 0840   potassium  chloride SA (KLOR-CON  M) CR tablet 40-60 mEq  40-60 mEq Oral Daily PRN Gerome Maurilio M, PA-C       tiZANidine (ZANAFLEX) tablet 4 mg  4 mg Oral Q6H PRN Samtani, Jai-Gurmukh, MD         Discharge Medications: Please see discharge summary for a list of discharge medications.  Relevant Imaging Results:  Relevant Lab Results:   Additional Information SSN:[id number]  Montie LOISE Louder, LCSW

## 2024-10-23 NOTE — Plan of Care (Signed)

## 2024-10-23 NOTE — Progress Notes (Signed)
 Inpatient Rehab Admissions Coordinator:    I discussed case with rehab MD who consulted on this Pt.. She feels Pt. Is currently more appropriate for d/c to SNF. I will not pursue CIR admission at this time.   Leita Kleine, MS, CCC-SLP Rehab Admissions Coordinator  [phone] (celll) [phone] (office)

## 2024-10-23 NOTE — Progress Notes (Signed)
 Follow up CBG 107 and patient ordered breakfast. Continues to be asymptomatic.

## 2024-10-23 NOTE — Progress Notes (Signed)
 Physical Therapy Treatment Patient Details Name: Phyllis Pittman MRN: [id number] DOB: 1976/01/25 Today's Date: 10/23/2024   History of Present Illness Pt is a 48 y.o female admitted 10/31 for R foot wound. 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 PMH: HTN, sepsis, DM, HTN, HLD, OSA, CVA, fibromyalgia, neuropathy, sciatica, s/p L fifth toe amp, POTS    PT Comments  Pt admitted with above diagnosis. Pt was agreeable to get to chair today.  Pt reports some dizziness but BP stable and dizziness didn't limit pt. Pt sitting up better today with CGA.  Pt could not stand to RW on left foot only.  Pt was able to perform squat pivot transfer to drop arm recliner with min assist of 2.  Pt constantly asking PT to back up and didn't want PT to assist her although she needs some assist for safety. Will continue to follow acutely and recommend post acute rehab < 3 hours day.  Pt currently with functional limitations due to the deficits listed below (see PT Problem List). Pt will benefit from acute skilled PT to increase their independence and safety with mobility to allow discharge.       If plan is discharge home, recommend the following: A little help with walking and/or transfers;A little help with bathing/dressing/bathroom;Assistance with cooking/housework;Assist for transportation   Can travel by private vehicle        Equipment Recommendations  Other (comment) (TBA)    Recommendations for Other Services       Precautions / Restrictions Precautions Precautions: Fall Recall of Precautions/Restrictions: Intact Precaution/Restrictions Comments: Watch BP, rectal tube Required Braces or Orthoses: Other Brace Other Brace: limb guard right residual limb Restrictions Weight Bearing Restrictions Per Provider Order: Yes RLE Weight Bearing Per Provider Order: Non weight bearing     Mobility  Bed Mobility Overal bed mobility: Needs  Assistance Bed Mobility: Supine to Sit, Sit to Supine     Supine to sit: Contact guard, HOB elevated, Used rails Sit to supine: Contact guard assist, HOB elevated, Used rails   General bed mobility comments: CGA for line management.Incr time for pt to come to EOB and she asked PT not to assist.    Transfers Overall transfer level: Needs assistance Equipment used: None Transfers: Sit to/from Stand, Bed to chair/wheelchair/BSC Sit to Stand: Total assist, +2 physical assistance, From elevated surface     Squat pivot transfers: Min assist, From elevated surface     General transfer comment: Pt very adamant that PT not assist her. Explained that PT needed to guard pt.  Pt wanted to try to stand to RW however could not stand on the left LE alone with pt barely clearing bottomoff bed.  Attempted to have pt scoot to drop arm recliner and pt took about 12 min to transfer as she wasnt following directions initially and didnt want help but couldnt figure out how to scoot even though PT was cuing.  Ultimately, pt performed squat pivot to recliner with min assist with pt lifting buttocks well with use of gait belt to transfer to chair.    Ambulation/Gait                   Stairs             Wheelchair Mobility     Tilt Bed    Modified Rankin (Stroke Patients Only)       Balance Overall balance assessment:  Needs assistance Sitting-balance support: No upper extremity supported, Feet supported, Bilateral upper extremity supported, Single extremity supported Sitting balance-Leahy Scale: Fair Sitting balance - Comments: Sat EOB for up to 10 min with supervision.  VSS.   Standing balance support: Bilateral upper extremity supported, During functional activity Standing balance-Leahy Scale: Poor Standing balance comment: unable to get to standing.                            Communication Communication Communication: No apparent difficulties  Cognition Arousal:  Alert Behavior During Therapy: Flat affect   PT - Cognitive impairments: Safety/Judgement, Problem solving                         Following commands: Intact      Cueing Cueing Techniques: Verbal cues, Tactile cues  Exercises General Exercises - Lower Extremity Ankle Circles/Pumps: AROM, Left, 5 reps, Supine Quad Sets: AROM, Both, 5 reps, Supine Heel Slides: AROM, Left, 5 reps, Supine    General Comments General comments (skin integrity, edema, etc.): BP soft but pt not symptomatic.  Other VSS      Pertinent Vitals/Pain Pain Assessment Pain Assessment: Faces Faces Pain Scale: Hurts even more Pain Location: RLE Pain Descriptors / Indicators: Grimacing, Guarding Pain Intervention(s): Limited activity within patient's tolerance, Monitored during session, Premedicated before session, Repositioned    Home Living                          Prior Function            PT Goals (current goals can now be found in the care plan section) Acute Rehab PT Goals Patient Stated Goal: to go home Progress towards PT goals: Progressing toward goals    Frequency    Min 2X/week      PT Plan      Co-evaluation              AM-PAC PT 6 Clicks Mobility   Outcome Measure  Help needed turning from your back to your side while in a flat bed without using bedrails?: None Help needed moving from lying on your back to sitting on the side of a flat bed without using bedrails?: None Help needed moving to and from a bed to a chair (including a wheelchair)?: Total Help needed standing up from a chair using your arms (e.g., wheelchair or bedside chair)?: Total Help needed to walk in hospital room?: Total Help needed climbing 3-5 steps with a railing? : Total 6 Click Score: 12    End of Session Equipment Utilized During Treatment: Gait belt Activity Tolerance: Patient tolerated treatment well Patient left: with call bell/phone within reach;in chair;with chair  alarm set Nurse Communication: Mobility status;Need for lift equipment PT Visit Diagnosis: Muscle weakness (generalized) (M62.81)     Time: 8961-8887 PT Time Calculation (min) (ACUTE ONLY): 34 min  Charges:    $Therapeutic Exercise: 8-22 mins $Therapeutic Activity: 8-22 mins PT General Charges $$ ACUTE PT VISIT: 1 Visit                     Twala Collings M,PT Acute Rehab Services [phone]    Stephane JULIANNA Bevel 10/23/2024, 2:04 PM

## 2024-10-23 NOTE — Consult Note (Signed)
 Physical Medicine and Rehabilitation Consult   HPI: Phyllis Pittman is a 48 y.o. female with PMHx of  has a past medical history of Anxiety, Cancer (HCC), CVA (cerebral vascular accident) (HCC), Depression with anxiety, Diabetes mellitus without complication (HCC), Female hirsutism, Fibromyalgia, GERD without esophagitis, Hyperlipidemia, Hypertension, IBS (irritable bowel syndrome), Long term (current) use of insulin  (HCC), Migraine, Myopia of both eyes, Neuropathy, Neuropathy, OSA (obstructive sleep apnea), Sciatica, and Shingles. . They were admitted to [facility] on 10/16/2024 after being advised at her wound care clinic on 10-22 and 10-29 to go to the ED due to concerns of increasing swelling, discoloration, and concern for infection in her right foot.  Which had multiple wounds undergoing debridement, and had a prior fifth ray amputation on 7-16.  In the ER, she had foul-smelling drainage, pain and development of a facial rash developing over 2 days,  As well as diarrhea and abdominal discomfort.  X-rays showed ulcerations through the left lateral forefoot, as well as air in the soft tissues and bony destruction in the distal 4th and 5th rays.  She was admitted to the hospital for presumed osteomyelitis of the left foot.  Dr. Reyne with orthopedics recommended surgical debridement with emergent amputation if she became floridly septic.  MRI showed gas gangrene and osteomyelitis of the 4th and 5th digits, and vascular consultation determined the foot was not salvageable regardless of vascular studies and recommended major amputation.  She underwent a right below the knee amputation with Dr. Harden on 11-5, with no intraoperative or postop complications.  She remains on a wound VAC with Prevena plus portable wound VAC pump for  management.  Hospitalization was otherwise complicated by sepsis secondary to osteomyelitis and EPEC diarrhea, treated with Unasyn, linezolid, vancomycin through 11-6  and azithromycin; facial cellulitis managed with topical ketoconazole; POTS syndrome on midodrine 10 mg 3 times daily; IBS with chronic diarrhea on Imodium 2 mg 3 times daily and if Flexi-Seal fecal management system; uncontrolled type 2 diabetes with an A1c of 12 adjusting insulin ; hyponatremia; anxiety/depression on hydroxyzine  twice daily and Xanax  as needed; and decubitus ulcers with wound care.  PM&R was consulted to evaluate appropriateness for IPR admission.   Per documentation, prior to admission, she was living in a private residence with her children who are available intermittently at home.  She lives in a single level home with a level entry, is independent of ADLs and uses a rolling walker at a modified independent level at baseline.  Currently, she is mod assist for ADLs, contact-guard assist for bed mobility, and contact-guard assist for sitting edge of bed 10 minutes with max encouragement.  Patient would not agree to transfer to chair during her session.  She was limited by orthostatic hypotension and dizziness, as well as notable weakness in the left lower extremity with strength in the ankle, knee, and hip limited to 2/5.  Based on the above review, feel patient would have difficulty tolerating a 3-hour day schedule of therapies at this time.  She remains severely orthostatic with blood pressures documented in the 80s over 50s, complicated by multiple episodes of recent hypoglycemia and ongoing presence of a rectal tube for fecal management.  Patient may be more appropriate for an SNF level of care, but could reconsider inpatient rehab if she makes gains in her therapies including tolerating transfer attempts; anticipate that with the current multiple wounds, right-sided BKA, and left-sided weakness she will need wheelchair level at discharge.  Additionally, with new BKA and multiple wounds, she will require caregivers/ family involvement for medical management moving forward - would  benefit from 24/7 assist if able to set up.    Joesph JAYSON Likes, DO 10/23/2024

## 2024-10-23 NOTE — TOC Initial Note (Signed)
 Transition of Care [facility]) - Initial/Assessment Note    Patient Details  Name: Phyllis Pittman MRN: [id number] Date of Birth: 1976/02/01  Transition of Care [facility]) CM/SW Contact:    Whitfield Campanile, Student-Social Work Phone Number: 10/23/2024, 10:02 AM  Clinical Narrative:                  9:57 AM: MSW Intern introduced self and role to patient at bedside. MSW informed patient about PT's recommendation for Acute Inpatient Rehab and explained CIR and SNF differences. Patient agreeable to Inpatient rehab and if needed going to a Skilled Nursing Facility for rehab as back up plan.  Whitfield Campanile, MSW Intern    Barriers to Discharge: Continued Medical Work up   Patient Goals and CMS Choice            Expected Discharge Plan and Services                                              Prior Living Arrangements/Services                       Activities of Daily Living   ADL Screening (condition at time of admission) Independently performs ADLs?: No Does the patient have a NEW difficulty with bathing/dressing/toileting/self-feeding that is expected to last >3 days?: Yes (Initiates electronic notice to provider for possible OT consult) Does the patient have a NEW difficulty with getting in/out of bed, walking, or climbing stairs that is expected to last >3 days?: Yes (Initiates electronic notice to provider for possible PT consult) Does the patient have a NEW difficulty with communication that is expected to last >3 days?: Yes (Initiates electronic notice to provider for possible SLP consult) Is the patient deaf or have difficulty hearing?: No Does the patient have difficulty seeing, even when wearing glasses/contacts?: No Does the patient have difficulty concentrating, remembering, or making decisions?: No  Permission Sought/Granted                  Emotional Assessment              Admission diagnosis:  Osteomyelitis (HCC) [M86.9] Acute osteomyelitis  of phalanx of right foot (HCC) [M86.171] Patient Active Problem List   Diagnosis Date Noted   Cutaneous abscess of right foot 10/19/2024   Gangrene of right foot (HCC) 10/19/2024   Facial dermatitis 10/19/2024   Diarrhea of infectious origin 10/19/2024   Acute osteomyelitis of phalanx of right foot (HCC) 10/18/2024   Osteomyelitis (HCC) 10/16/2024   Pyelonephritis 08/12/2021   Sepsis (HCC) 08/12/2021   Lactic acidosis 08/12/2021   Hyponatremia 08/12/2021   Hypokalemia 08/12/2021   Hyperglycemia 08/12/2021   T2DM (type 2 diabetes mellitus) (HCC) 08/12/2021   HTN (hypertension) 08/12/2021   HLD (hyperlipidemia) 08/12/2021   Anxiety and depression 08/12/2021   PCP:  [name], PA Pharmacy:   [id number] [place], [place] - [address] [place] [address] Phone: [phone] Fax: [phone]     Social Drivers of Health (SDOH) Social History: SDOH Screenings   Food Insecurity: Patient Declined (10/18/2024)  Housing: Unknown (10/18/2024)  Transportation Needs: Unknown (10/18/2024)  Utilities: Patient Declined (10/18/2024)  Social Connections: Unknown (04/20/2022)   Received from [facility]  Tobacco Use: High Risk (10/21/2024)   SDOH Interventions:     Readmission  Risk Interventions     No data to display

## 2024-10-23 NOTE — Progress Notes (Signed)
 Mobility Specialist Progress Note:    10/23/24 1204  Mobility  Activity Mechanically lifted from chair to bed  Level of Assistance Minimal assist, patient does 75% or more  Activity Response Tolerated well  Mobility Referral Yes  Mobility visit 1 Mobility  Mobility Specialist Start Time (ACUTE ONLY) 1201  Mobility Specialist Stop Time (ACUTE ONLY) 1223  Mobility Specialist Time Calculation (min) (ACUTE ONLY) 22 min   Pt received in chair, requesting to transfer back to bed. Declined hoyer lift. Turned chair 180 degrees, leading with LLE to scoot back to bed. Required MinA +2 for safety. No LOB. Pt prefers no physical assistance or contact. Pt lying down in bed with all needs met, call bell in reach. RN notified.   Keeshawn Fakhouri Mobility Specialist Please contact via Special Educational Needs Teacher or  Rehab office at [phone]

## 2024-10-23 NOTE — Progress Notes (Signed)
                                                                                                                          Re: Phyllis Pittman  Date of Birth: 1976/11/24 Date: 10/23/2024  To Whom It May Concern:  Please be advised that the above-named patient will require a short-term nursing home stay-anticipated 30 days or less for rehabilitation and strengthening. The plan is to return home.

## 2024-10-23 NOTE — Progress Notes (Signed)
 TR[facility]   ROUNDING   NOTE Phyllis Pittman FMW:[id number]  DOB: 03/13/1976  DOA: 10/16/2024  PCP: [name], PA  10/23/2024,4:28 PM  LOS: 7 days    Code Status: Full code     from: Home   48 year old female with poorly controlled diabetes mellitus HTN HLD diabetic neuropathy Has had diabetes for about 20 years been on insulin  for about 5  Admitted for diabetic foot wound infection  Chronology  7/15 [facility] evaluation cellulitis abscess concerning for soft tissue infection of left foot underwent I&D and partial fifth ray amputation 716 and was treated for strep agalactiae and a PICC line was placed and OPAT was performed-A1c was 12.2 she was told to follow-up in wound care center 10/31 represent to [facility] from wound care center after several visits to wound care office and being told to go to ED-found to be lethargic finally on 10/29 foul-smelling wound etc. 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  Resumed topical ketoconazole Can be followed by dermatology as an outpatient POTS syndrome Continues midodrine 10 3 times daily meals for hypotension Slightly hypotensive because of pain meds Not really symptomatic EPEC diarrhea--- completed symptomatic management with azithromycin and only supportive care currently Underlying IBS Has a Flexi-Seal in need to liberate from this Increased Imodium to 2  4 times daily max dose Still Flexi-Seal in place Uncontrolled diabetes mellitus with recent A1c 12 complicated by severe hypoglycemia 11/6 Sliding scale alone 3 times daily with meals CBG improved 75-1 18 Make sure she has a snack at night-ordered Electrolyte abnormalities including elevated anion gap on admission Mild hyponatremia-relatively resolved Elevated anion gap-resolved Anxiety/depression Continues on Xanax  0.5 --understands only limited Rx at d/c hydroxyzine  50 twice daily Does not appear to be taking any longer Wellbutrin  and BuSpar --we stopped night time elevail Decubitus ulcers on bottom stage II She has a mild decubitus that I visualized today-she needs to turn and stay dry Offload and OOB as able  Data Reviewed today:   Sodium 131 potassium 4.1 BUN/creatinine 5/0.4 WBC 12.5 hemoglobin 8.7 platelet 577  DVT prophylaxis: Lovenox   Status is: Inpatient Inpatient pending eval by PT and postop management by surgery/Ortho    Dispo/Global plan: Probably needs skilled placement declined for CIR  Time 20   Subjective:   Pain is still impressive in the 8-10 range She says oxycodone is not working at all I have offered to change to oral Dilaudid  with the understanding that this is not a long-term solution to her pain She is still having diarrhea She has no nausea vomiting   Objective + exam Vitals:   10/23/24 0005 10/23/24 0411 10/23/24 0900 10/23/24 1122  BP: (!) 83/58 129/67 (!) 82/54 102/76  Pulse: 72 95 81 78  Resp: 14 12 15 18   Temp: 97.9 F (36.6 C) 97.7 F (36.5 C) 97.8 F (36.6 C) 98 F (36.7 C)  TempSrc: Oral Oral Oral Oral  SpO2: 99% 94% 96%  97%  Weight:      Height:       Filed Weights   10/16/24 1832 10/17/24 1449 10/21/24 0954  Weight: 54.8 kg 63.5 kg 63.5 kg     Examination: EOMI NCAT disheveled white female no distress squamous/papular areas over hands Chest is clear no wheeze I examined her bottom with nursing today she has a slight  stage II decubitus and central natal cleft She has erythema around this Flexi-Seal is in place  Scheduled Meds:  acetaminophen   1,000 mg Oral Q6H   Chlorhexidine Gluconate Cloth  6 each Topical Daily   collagenase   Topical Daily   enoxaparin  (LOVENOX ) injection  40 mg Subcutaneous Q24H   feeding supplement  237 mL Oral BID BM   ferrous sulfate  325 mg Oral QODAY   gabapentin  800 mg Oral TID   hydrOXYzine   50 mg Oral BID   insulin  aspart  0-9 Units Subcutaneous TID WC   ketoconazole  1 Application Topical BID   loperamide  2 mg Oral QID   midodrine  10 mg Oral TID WC   nystatin  5 mL Oral QID   pantoprazole   40 mg Oral Daily   Continuous Infusions:   albuterol, ALPRAZolam , dextrose, hydrALAZINE, HYDROmorphone  (DILAUDID ) injection, HYDROmorphone , labetalol, metoprolol tartrate, ondansetron , mouth rinse, potassium chloride , tiZANidine  Jai-Gurmukh Knoah Nedeau, MD  [facility]

## 2024-10-24 DIAGNOSIS — M869 Osteomyelitis, unspecified: Secondary | ICD-10-CM | POA: Diagnosis not present

## 2024-10-24 LAB — BASIC METABOLIC PANEL WITH GFR
Anion gap: 9 (ref 5–15)
BUN: <5 mg/dL — ABNORMAL LOW (ref 6–20)
CO2: 28 mmol/L (ref 22–32)
Calcium: 7.8 mg/dL — ABNORMAL LOW (ref 8.9–10.3)
Chloride: 94 mmol/L — ABNORMAL LOW (ref 98–111)
Creatinine, Ser: 0.51 mg/dL (ref 0.44–1.00)
GFR, Estimated: >60 mL/min (ref >60)
Glucose, Bld: 163 mg/dL — ABNORMAL HIGH (ref 70–99)
Potassium: 4.1 mmol/L (ref 3.5–5.1)
Sodium: 131 mmol/L — ABNORMAL LOW (ref 135–145)

## 2024-10-24 LAB — GLUCOSE, CAPILLARY
Glucose-Capillary: 134 mg/dL — ABNORMAL HIGH (ref 70–99)
Glucose-Capillary: 121 mg/dL — ABNORMAL HIGH (ref 70–99)
Glucose-Capillary: 109 mg/dL — ABNORMAL HIGH (ref 70–99)
Glucose-Capillary: 219 mg/dL — ABNORMAL HIGH (ref 70–99)

## 2024-10-24 NOTE — Plan of Care (Signed)
  Problem: Nutritional: Goal: Maintenance of adequate nutrition will improve Outcome: Progressing   Problem: Nutrition: Goal: Adequate nutrition will be maintained Outcome: Progressing

## 2024-10-24 NOTE — Progress Notes (Signed)
 Patient ID: Phyllis Pittman, female   DOB: 07/12/1976, 48 y.o.   MRN: [id number] Patient is status post right transtibial amputation.  Will have the wound VAC removed and dry dressing applied at time of discharge.

## 2024-10-24 NOTE — Plan of Care (Signed)

## 2024-10-24 NOTE — Progress Notes (Signed)
 TR[facility]   ROUNDING   NOTE Phyllis Pittman FMW:[id number]  DOB: 1976/12/05  DOA: 10/16/2024  PCP: Phyllis Rosina SAILOR, PA  10/24/2024,4:25 PM  LOS: 8 days    Code Status: Full code     from: Home   48 year old female with poorly controlled diabetes mellitus HTN HLD diabetic neuropathy Has had diabetes for about 20 years been on insulin  for about 5  Admitted for diabetic foot wound infection  Chronology  7/15 [facility] evaluation cellulitis abscess concerning for soft tissue infection of left foot underwent I&D and partial fifth ray amputation 716 and was treated for strep agalactiae and a PICC line was placed and OPAT was performed-A1c was 12.2 she was told to follow-up in wound care center 10/31 represent to [facility] from wound care center after several visits to wound care office and being told to go to ED-found to be lethargic finally on 10/29 foul-smelling wound etc. 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  Resumed topical ketoconazole-has a bump on the back of her neck that is stable Can be followed by dermatology as an outpatient POTS syndrome Continues midodrine 10 3 times daily meals for hypotension Slightly hypotensive because of pain meds-stopped IV Dilaudid  Not really symptomatic EPEC diarrhea--- completed symptomatic management with azithromycin and only supportive care currently Underlying IBS Has a Flexi-Seal in need to liberate from this-she  needs to get up-remove Flexi-Seal in 1 to 2 days hopefully Increased Imodium to 2 4 times daily max dose she thinks diarrhea is slowing Uncontrolled diabetes mellitus with recent A1c 12 complicated by severe hypoglycemia 11/6 Sliding scale alone 3 times daily with meals given hypoglycemia CBGs 100s to 130s-needs nighttime snack Electrolyte abnormalities including elevated anion gap on admission Mild hyponatremia-relatively resolved Elevated anion gap-resolved Anxiety/depression Continues on Xanax  0.5 --understands only limited Rx at d/c hydroxyzine  50 twice daily Does not appear to be taking any longer Wellbutrin  and BuSpar --we stopped night time elevail Decubitus ulcers on bottom stage II She has a mild decubitus that was visualized on 11/7-offload and up out of bed  Data Reviewed today:   Sodium 131 potassium 4.1 chloride 94 BUN/creatinine 5/0.5  DVT prophylaxis: Lovenox   Status is: Inpatient Inpatient pending eval by PT and postop management by surgery/Ortho    Dispo/Global plan: Probably needs skilled placement declined for CIR  Time 20   Subjective:   She does not feel as well as she did quite listless and is not eating We changed her to oral Dilaudid  it appears she received some IV-she is a little bit sleepy so I have stopped the Dilaudid  and the IV   Objective + exam Vitals:   10/24/24 0330 10/24/24 0833 10/24/24 1115 10/24/24 1521  BP: 111/65 103/70 138/68 134/73  Pulse: 84 87 82 76  Resp: 16 15 11 15   Temp: 98.9 F (37.2 C) 98 F (36.7 C) 98.3 F (36.8 C) 98.4 F (36.9 C)  TempSrc: Oral Oral Oral Oral  SpO2: 90% 95% 91%   Weight:  Height:       Filed Weights   10/16/24 1832 10/17/24 1449 10/21/24 0954  Weight: 54.8 kg 63.5 kg 63.5 kg     Examination: EOMI NCAT disheveled white female no distress squamous/papular areas over hands Chest is clear no wheeze Abdomen is soft Flexi-Seal in place Right-sided BKA noted  Scheduled Meds:   acetaminophen   1,000 mg Oral Q6H   Chlorhexidine Gluconate Cloth  6 each Topical Daily   collagenase   Topical Daily   enoxaparin  (LOVENOX ) injection  40 mg Subcutaneous Q24H   feeding supplement  237 mL Oral BID BM   ferrous sulfate  325 mg Oral QODAY   gabapentin  800 mg Oral TID   hydrOXYzine   50 mg Oral BID   insulin  aspart  0-9 Units Subcutaneous TID WC   ketoconazole  1 Application Topical BID   loperamide  2 mg Oral QID   midodrine  10 mg Oral TID WC   nystatin  5 mL Oral QID   pantoprazole   40 mg Oral Daily   Continuous Infusions:   albuterol, ALPRAZolam , dextrose, hydrALAZINE, HYDROmorphone  (DILAUDID ) injection, HYDROmorphone , labetalol, metoprolol tartrate, ondansetron , mouth rinse, potassium chloride , tiZANidine  Jai-Gurmukh Ricquel Foulk, MD  [facility]

## 2024-10-25 DIAGNOSIS — M869 Osteomyelitis, unspecified: Secondary | ICD-10-CM | POA: Diagnosis not present

## 2024-10-25 LAB — GLUCOSE, CAPILLARY
Glucose-Capillary: 262 mg/dL — ABNORMAL HIGH (ref 70–99)
Glucose-Capillary: 313 mg/dL — ABNORMAL HIGH (ref 70–99)
Glucose-Capillary: 239 mg/dL — ABNORMAL HIGH (ref 70–99)
Glucose-Capillary: 286 mg/dL — ABNORMAL HIGH (ref 70–99)
Glucose-Capillary: 324 mg/dL — ABNORMAL HIGH (ref 70–99)
Glucose-Capillary: 333 mg/dL — ABNORMAL HIGH (ref 70–99)

## 2024-10-25 LAB — CULTURE, BLOOD (ROUTINE X 2)
Culture: NO GROWTH
Culture: NO GROWTH
Special Requests: ADEQUATE
Special Requests: ADEQUATE

## 2024-10-25 MED ORDER — HYDROMORPHONE HCL 2 MG PO TABS
4.0000 mg | ORAL_TABLET | ORAL | Status: DC | PRN
  Administered 2024-10-25 – 2024-10-31 (×13): 4 mg via ORAL
  Filled 2024-10-25 (×13): qty 2

## 2024-10-25 MED ORDER — DIPHENOXYLATE-ATROPINE 2.5-0.025 MG PO TABS
1.0000 | ORAL_TABLET | Freq: Four times a day (QID) | ORAL | Status: DC
  Administered 2024-10-25 (×2): 1 via ORAL
  Filled 2024-10-25 (×3): qty 1

## 2024-10-25 NOTE — Plan of Care (Signed)
  Problem: Education: Goal: Ability to describe self-care measures that may prevent or decrease complications (Diabetes Survival Skills Education) will improve Outcome: Progressing Goal: Individualized Educational Video(s) Outcome: Progressing   Problem: Coping: Goal: Ability to adjust to condition or change in health will improve Outcome: Progressing   Problem: Fluid Volume: Goal: Ability to maintain a balanced intake and output will improve Outcome: Progressing   Problem: Health Behavior/Discharge Planning: Goal: Ability to identify and utilize available resources and services will improve Outcome: Progressing Goal: Ability to manage health-related needs will improve Outcome: Progressing   Problem: Metabolic: Goal: Ability to maintain appropriate glucose levels will improve Outcome: Progressing   Problem: Nutritional: Goal: Maintenance of adequate nutrition will improve Outcome: Progressing Goal: Progress toward achieving an optimal weight will improve Outcome: Progressing   Problem: Skin Integrity: Goal: Risk for impaired skin integrity will decrease Outcome: Progressing   Problem: Tissue Perfusion: Goal: Adequacy of tissue perfusion will improve Outcome: Progressing   Problem: Education: Goal: Ability to describe self-care measures that may prevent or decrease complications (Diabetes Survival Skills Education) will improve Outcome: Progressing Goal: Individualized Educational Video(s) Outcome: Progressing   Problem: Cardiac: Goal: Ability to maintain an adequate cardiac output will improve Outcome: Progressing   Problem: Health Behavior/Discharge Planning: Goal: Ability to identify and utilize available resources and services will improve Outcome: Progressing Goal: Ability to manage health-related needs will improve Outcome: Progressing   Problem: Fluid Volume: Goal: Ability to achieve a balanced intake and output will improve Outcome: Progressing    Problem: Metabolic: Goal: Ability to maintain appropriate glucose levels will improve Outcome: Progressing   Problem: Nutritional: Goal: Maintenance of adequate nutrition will improve Outcome: Progressing Goal: Maintenance of adequate weight for body size and type will improve Outcome: Progressing   Problem: Respiratory: Goal: Will regain and/or maintain adequate ventilation Outcome: Progressing   Problem: Urinary Elimination: Goal: Ability to achieve and maintain adequate renal perfusion and functioning will improve Outcome: Progressing   Problem: Skin Integrity: Goal: Skin integrity will improve Outcome: Progressing   Problem: Education: Goal: Knowledge of General Education information will improve Description: Including pain rating scale, medication(s)/side effects and non-pharmacologic comfort measures Outcome: Progressing   Problem: Health Behavior/Discharge Planning: Goal: Ability to manage health-related needs will improve Outcome: Progressing   Problem: Clinical Measurements: Goal: Ability to maintain clinical measurements within normal limits will improve Outcome: Progressing Goal: Will remain free from infection Outcome: Progressing Goal: Diagnostic test results will improve Outcome: Progressing Goal: Respiratory complications will improve Outcome: Progressing Goal: Cardiovascular complication will be avoided Outcome: Progressing

## 2024-10-25 NOTE — Plan of Care (Signed)
 Problem: Education: Goal: Ability to describe self-care measures that may prevent or decrease complications (Diabetes Survival Skills Education) will improve Outcome: Progressing Goal: Individualized Educational Video(s) Outcome: Progressing   Problem: Coping: Goal: Ability to adjust to condition or change in health will improve Outcome: Progressing   Problem: Fluid Volume: Goal: Ability to maintain a balanced intake and output will improve Outcome: Progressing   Problem: Health Behavior/Discharge Planning: Goal: Ability to identify and utilize available resources and services will improve Outcome: Progressing Goal: Ability to manage health-related needs will improve Outcome: Progressing   Problem: Metabolic: Goal: Ability to maintain appropriate glucose levels will improve Outcome: Progressing   Problem: Skin Integrity: Goal: Risk for impaired skin integrity will decrease Outcome: Progressing   Problem: Tissue Perfusion: Goal: Adequacy of tissue perfusion will improve Outcome: Progressing   Problem: Education: Goal: Ability to describe self-care measures that may prevent or decrease complications (Diabetes Survival Skills Education) will improve Outcome: Progressing Goal: Individualized Educational Video(s) Outcome: Progressing   Problem: Cardiac: Goal: Ability to maintain an adequate cardiac output will improve Outcome: Progressing   Problem: Health Behavior/Discharge Planning: Goal: Ability to identify and utilize available resources and services will improve Outcome: Progressing Goal: Ability to manage health-related needs will improve Outcome: Progressing   Problem: Fluid Volume: Goal: Ability to achieve a balanced intake and output will improve Outcome: Progressing   Problem: Metabolic: Goal: Ability to maintain appropriate glucose levels will improve Outcome: Progressing   Problem: Nutritional: Goal: Maintenance of adequate nutrition will  improve Outcome: Progressing Goal: Maintenance of adequate weight for body size and type will improve Outcome: Progressing   Problem: Respiratory: Goal: Will regain and/or maintain adequate ventilation Outcome: Progressing   Problem: Urinary Elimination: Goal: Ability to achieve and maintain adequate renal perfusion and functioning will improve Outcome: Progressing   Problem: Clinical Measurements: Goal: Ability to avoid or minimize complications of infection will improve Outcome: Progressing   Problem: Skin Integrity: Goal: Skin integrity will improve Outcome: Progressing   Problem: Education: Goal: Knowledge of General Education information will improve Description: Including pain rating scale, medication(s)/side effects and non-pharmacologic comfort measures Outcome: Progressing   Problem: Health Behavior/Discharge Planning: Goal: Ability to manage health-related needs will improve Outcome: Progressing   Problem: Clinical Measurements: Goal: Ability to maintain clinical measurements within normal limits will improve Outcome: Progressing Goal: Will remain free from infection Outcome: Progressing Goal: Diagnostic test results will improve Outcome: Progressing Goal: Respiratory complications will improve Outcome: Progressing Goal: Cardiovascular complication will be avoided Outcome: Progressing   Problem: Activity: Goal: Risk for activity intolerance will decrease Outcome: Progressing   Problem: Nutrition: Goal: Adequate nutrition will be maintained Outcome: Progressing   Problem: Coping: Goal: Level of anxiety will decrease Outcome: Progressing   Problem: Elimination: Goal: Will not experience complications related to bowel motility Outcome: Progressing Goal: Will not experience complications related to urinary retention Outcome: Progressing   Problem: Pain Managment: Goal: General experience of comfort will improve and/or be controlled Outcome:  Progressing   Problem: Safety: Goal: Ability to remain free from injury will improve Outcome: Progressing   Problem: Skin Integrity: Goal: Risk for impaired skin integrity will decrease Outcome: Progressing   Problem: Nutrition Goal: Patient maintains adequate hydration Outcome: Progressing Goal: Patient maintains weight Outcome: Progressing Goal: Patient/Family demonstrates understanding of diet Outcome: Progressing Goal: Patient/Family independently completes tube feeding Outcome: Progressing Goal: Patient will have no more than 5 lb weight change during LOS Outcome: Progressing Goal: Patient will utilize adaptive techniques to administer nutrition Outcome: Progressing Goal: Patient  will verbalize dietary restrictions Outcome: Progressing

## 2024-10-25 NOTE — Progress Notes (Signed)
 [facility]   ROUNDING   NOTE Phyllis Pittman FMW:[id number]  DOB: 1976-09-08  DOA: 10/16/2024  PCP: [name], PA  10/25/2024,3:41 PM  LOS: 9 days    Code Status: Full code     from: Home   48 year old female with poorly controlled diabetes mellitus HTN HLD diabetic neuropathy Has had diabetes for about 20 years been on insulin  for about 5  Admitted for diabetic foot wound infection  Chronology  7/15 [facility] evaluation cellulitis abscess concerning for soft tissue infection of left foot underwent I&D and partial fifth ray amputation 716 and was treated for strep agalactiae and a PICC line was placed and OPAT was performed-A1c was 12.2 she was told to follow-up in wound care center 10/31 represent to [facility] from wound care center after several visits to wound care office and being told to go to ED-found to be lethargic finally on 10/29 foul-smelling wound etc. etc.-started on antibiotics 11/1 because of diarrhea tested positive for EPEC and treated 11/2 infectious disease consulted 11/5 right-sided BKA Dr. Harden   Assessment  & Plan :    Sepsis on admission secondary to diabetic foot infection with gangrene on admission Surgical cure-ID rec treated through 11/6 Unasyn linezolid Vancocin-completed therapy Avoid IV as hypotensive- Oxy IR-->Dilaudid  4 mg every 4 as needed, IV Dilaudid   0.5 q 8 Adjunctive Zanaflex increased to every 6 --continue gabapentin 800 3 times daily Dermatitis--?  Facial cellulitis Resumed topical ketoconazole-has a bump on the back of her neck that is stable Can be followed by dermatology as an outpatient POTS syndrome Continues midodrine 10 3 times daily meals for hypotension Overall stabilizing off of IVF pain meds EPEC diarrhea--- completed symptomatic management with azithromycin and only supportive care currently Underlying IBS Has a Flexi-Seal --remove Flexi-Seal in 1 to 2 days hopefully Increased Imodium to 2 mg 4 times daily Adding Lomotil and if  has further diarrhea despite 1 tab 4 times daily of Lomotil will retest for diarrheal illness Uncontrolled diabetes mellitus with recent A1c 12 complicated by severe hypoglycemia 11/6 Sliding scale alone 3 times daily with meals given hypoglycemia CBGs 100s to 130s-needs nighttime snack Electrolyte abnormalities including elevated anion gap on admission Check labs in a.m. again Anxiety/depression Continues on Xanax  0.5 --understands only limited Rx at d/c hydroxyzine  50 twice daily Does not appear to be taking any longer Wellbutrin  and BuSpar --we stopped night time elevail Decubitus ulcers on bottom stage II She has a mild decubitus that was visualized on 11/7-offload and up out of bed  Data Reviewed today:    DVT prophylaxis: Lovenox   Status is: Inpatient Inpatient pending eval by PT and postop management by surgery/Ortho    Dispo/Global plan: Probably needs skilled placement declined for CIR  Time 20   Subjective:   States pain is uncontrolled Asking for pain I have declined her request and instead have offered increasing oral Dilaudid  as she is already on different narcotics Still having diarrhea  Objective + exam Vitals:   10/25/24 0221 10/25/24 0313 10/25/24 0725 10/25/24 1151  BP:  97/63 109/68 125/82  Pulse:  82 82 77  Resp: 18 20 16 15   Temp:  98.3 F (36.8 C) (!) 97.1 F (36.2 C) 98 F (36.7 C)  TempSrc:  Oral Axillary Oral  SpO2:  95% 94% 95%  Weight: 62.1 kg     Height:       Filed Weights   10/17/24 1449 10/21/24 0954 10/25/24 0221  Weight: 63.5 kg 63.5 kg 62.1 kg  Examination:  Flat affect less depressed though Chest is clear S1-S2 no murmur abdomen obese nontender Power 5/5  Scheduled Meds:  acetaminophen   1,000 mg Oral Q6H   Chlorhexidine Gluconate Cloth  6 each Topical Daily   collagenase   Topical Daily   enoxaparin  (LOVENOX ) injection  40 mg Subcutaneous Q24H   feeding supplement  237 mL Oral BID BM   ferrous sulfate  325 mg Oral  QODAY   gabapentin  800 mg Oral TID   hydrOXYzine   50 mg Oral BID   insulin  aspart  0-9 Units Subcutaneous TID WC   ketoconazole  1 Application Topical BID   loperamide  2 mg Oral QID   midodrine  10 mg Oral TID WC   nystatin  5 mL Oral QID   pantoprazole   40 mg Oral Daily   Continuous Infusions:   albuterol, ALPRAZolam , dextrose, hydrALAZINE, HYDROmorphone  (DILAUDID ) injection, HYDROmorphone , labetalol, metoprolol tartrate, ondansetron , mouth rinse, potassium chloride , tiZANidine  Jai-Gurmukh Trestan Vahle, MD  [facility]

## 2024-10-25 NOTE — Plan of Care (Signed)
   Problem: Nutritional: Goal: Maintenance of adequate nutrition will improve Outcome: Progressing

## 2024-10-26 DIAGNOSIS — M869 Osteomyelitis, unspecified: Secondary | ICD-10-CM | POA: Diagnosis not present

## 2024-10-26 LAB — GLUCOSE, CAPILLARY
Glucose-Capillary: 270 mg/dL — ABNORMAL HIGH (ref 70–99)
Glucose-Capillary: 238 mg/dL — ABNORMAL HIGH (ref 70–99)
Glucose-Capillary: 344 mg/dL — ABNORMAL HIGH (ref 70–99)
Glucose-Capillary: 280 mg/dL — ABNORMAL HIGH (ref 70–99)

## 2024-10-26 MED ORDER — INSULIN GLARGINE-YFGN 100 UNIT/ML ~~LOC~~ SOLN
10.0000 [IU] | Freq: Every day | SUBCUTANEOUS | Status: DC
  Administered 2024-10-26: 10 [IU] via SUBCUTANEOUS
  Filled 2024-10-26 (×2): qty 0.1

## 2024-10-26 MED ORDER — BASAGLAR KWIKPEN 100 UNIT/ML ~~LOC~~ SOPN: 10.0000 [IU] | PEN_INJECTOR | Freq: Every day | SUBCUTANEOUS | Status: DC

## 2024-10-26 MED ORDER — GLIMEPIRIDE 1 MG PO TABS
1.0000 mg | ORAL_TABLET | Freq: Every day | ORAL | Status: DC
  Administered 2024-10-27: 1 mg via ORAL
  Filled 2024-10-26 (×2): qty 1

## 2024-10-26 MED ORDER — DIPHENOXYLATE-ATROPINE 2.5-0.025 MG PO TABS: 1.0000 | ORAL_TABLET | Freq: Three times a day (TID) | ORAL | Status: DC | PRN

## 2024-10-26 NOTE — Progress Notes (Signed)
 Occupational Therapy Treatment Patient Details Name: Phyllis Pittman MRN: [id number] DOB: [birth year] Today's Date: 10/26/2024   History of present illness Pt is a 48 y.o female admitted 10/31 for R foot wound. 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 PMH: HTN, sepsis, DM, HTN, HLD, OSA, CVA, fibromyalgia, neuropathy, sciatica, s/p L fifth toe amp, POTS   OT comments  Pt progressing toward goals, limited by soft BP/dizziness with lateral scoot transfer attempts. Pt needs min A for LB ADL at EOB, CGA for bed mobility and min-mod A for lateral scoot transfers at EOB. Pt educated on limb desensitization strategies as she reports some phantom sensations in RLE. Pt presenting with impairments listed below, will follow acutely. Patient will benefit from continued inpatient follow up therapy, <3 hours/day.       If plan is discharge home, recommend the following:  A lot of help with walking and/or transfers;A lot of help with bathing/dressing/bathroom;Assistance with cooking/housework   Equipment Recommendations  Other (comment) (defer)    Recommendations for Other Services PT consult    Precautions / Restrictions Precautions Precautions: Fall Recall of Precautions/Restrictions: Intact Precaution/Restrictions Comments: Watch BP Required Braces or Orthoses: Other Brace Other Brace: limb guard right residual limb Restrictions Weight Bearing Restrictions Per Provider Order: Yes RLE Weight Bearing Per Provider Order: Non weight bearing       Mobility Bed Mobility Overal bed mobility: Needs Assistance Bed Mobility: Supine to Sit, Sit to Supine     Supine to sit: Contact guard, HOB elevated, Used rails Sit to supine: HOB elevated, Used rails, Min assist, +2 for safety/equipment        Transfers Overall transfer level: Needs assistance Equipment used: None Transfers: Bed to chair/wheelchair/BSC     Squat pivot  transfers: Min assist, From elevated surface, +2 safety/equipment, Contact guard assist   Anterior-Posterior transfers: Mod assist         Balance Overall balance assessment: Needs assistance Sitting-balance support: No upper extremity supported, Feet supported, Bilateral upper extremity supported, Single extremity supported Sitting balance-Leahy Scale: Fair                                     ADL either performed or assessed with clinical judgement   ADL Overall ADL's : Needs assistance/impaired Eating/Feeding: Set up;Sitting                   Lower Body Dressing: Minimal assistance Lower Body Dressing Details (indicate cue type and reason): dons sock via figure 4 at [facility]                    Extremity/Trunk Assessment Upper Extremity Assessment Upper Extremity Assessment: Generalized weakness   Lower Extremity Assessment Lower Extremity Assessment: Defer to PT evaluation        Vision   Vision Assessment?: No apparent visual deficits   Perception Perception Perception: Not tested   Praxis Praxis Praxis: Not tested   Communication Communication Communication: No apparent difficulties   Cognition Arousal: Alert Behavior During Therapy: Flat affect Cognition: No apparent impairments                               Following commands: Intact        Cueing   Cueing Techniques: Verbal cues, Tactile cues, Gestural  cues  Exercises Other Exercises Other Exercises: limb desensitization strategeis    Shoulder Instructions       General Comments see PTA note for BP measures    Pertinent Vitals/ Pain       Pain Assessment Pain Assessment: Faces Pain Score: 8  Faces Pain Scale: Hurts whole lot Pain Location: RLE Pain Descriptors / Indicators: Grimacing, Guarding, Discomfort Pain Intervention(s): Limited activity within patient's tolerance, Monitored during session, Repositioned  Home Living                                           Prior Functioning/Environment              Frequency  Min 2X/week        Progress Toward Goals  OT Goals(current goals can now be found in the care plan section)  Progress towards OT goals: Progressing toward goals  Acute Rehab OT Goals Patient Stated Goal: to eat OT Goal Formulation: With patient Time For Goal Achievement: 11/05/24 Potential to Achieve Goals: Good ADL Goals Pt Will Perform Lower Body Dressing: with min assist;sit to/from stand Pt Will Transfer to Toilet: with min assist;bedside commode;stand pivot transfer Pt Will Perform Toileting - Clothing Manipulation and hygiene: with min assist;sit to/from stand;sitting/lateral leans  Plan      Co-evaluation    PT/OT/SLP Co-Evaluation/Treatment: Yes Reason for Co-Treatment: For patient/therapist safety;To address functional/ADL transfers PT goals addressed during session: Balance;Strengthening/ROM;Mobility/safety with mobility OT goals addressed during session: ADL's and self-care;Strengthening/ROM      AM-PAC OT 6 Clicks Daily Activity     Outcome Measure   Help from another person eating meals?: None Help from another person taking care of personal grooming?: A Little Help from another person toileting, which includes using toliet, bedpan, or urinal?: Total Help from another person bathing (including washing, rinsing, drying)?: A Lot Help from another person to put on and taking off regular upper body clothing?: A Little Help from another person to put on and taking off regular lower body clothing?: A Lot 6 Click Score: 15    End of Session    OT Visit Diagnosis: Other abnormalities of gait and mobility (R26.89);Unsteadiness on feet (R26.81);Muscle weakness (generalized) (M62.81)   Activity Tolerance Patient limited by fatigue   Patient Left in bed;with call bell/phone within reach;with bed alarm set   Nurse Communication Mobility status        Time:  1355-1435 OT Time Calculation (min): 40 min  Charges: OT General Charges $OT Visit: 1 Visit OT Treatments $Self Care/Home Management : 8-22 mins $Therapeutic Activity: 8-22 mins  Jaxsun Ciampi K, OTD, OTR/L SecureChat Preferred Acute Rehab [phone]   Laneta POUR Koonce 10/26/2024, 5:07 PM

## 2024-10-26 NOTE — Inpatient Diabetes Management (Signed)
 Inpatient Diabetes Program Recommendations  AACE/ADA: New Consensus Statement on Inpatient Glycemic Control (2015)  Target Ranges:  Prepandial:   less than 140 mg/dL      Peak postprandial:   less than 180 mg/dL (1-2 hours)      Critically ill patients:  140 - 180 mg/dL   Lab Results  Component Value Date   GLUCAP 238 (H) 10/26/2024   HGBA1C >15.5 (H) 10/19/2024    Latest Reference Range & Units 10/25/24 17:00 10/25/24 20:59 10/25/24 22:57 10/26/24 06:10 10/26/24 12:46  Glucose-Capillary 70 - 99 mg/dL 713 (H) 675 (H) 666 (H) 270 (H) 238 (H)  (H): Data is abnormally high Review of Glycemic Control  Diabetes history: DM2 Outpatient Diabetes medications: Tresiba 16 units daily, Humalog 3-5 units TID (not taking either one at home) Current orders for Inpatient glycemic control: Novolog  0-9 units correction scale TID  Inpatient Diabetes Program Recommendations:   Noted that blood sugars have been greater than 180 mg/dl.  Recommend adding Semglee  10 units daily if blood sugars continue to be elevated.  Continue current Novolog  0-9 units correction scale TID. Titrate dosages as needed.  Will continue to monitor blood sugars while in the hospital.  Marjorie Lunger RN BSN CDE Diabetes Coordinator Pager: [phone]  8am-5pm

## 2024-10-26 NOTE — Progress Notes (Signed)
 Foley cath clamped from 1532 to 1640. Patient had no sensation to void, right after unclamping , 300 cc of clear urine output collocated in the bag. Continuing the foley cath per MD order. Plan of care continues.

## 2024-10-26 NOTE — Progress Notes (Signed)
 [facility]   ROUNDING   NOTE Phyllis Pittman FMW:[id number]  DOB: 03/29/1976  DOA: 10/16/2024  PCP: [name], PA  10/26/2024,2:30 PM  LOS: 10 days    Code Status: Full code     from: Home   48 year old female with poorly controlled diabetes mellitus HTN HLD diabetic neuropathy Has had diabetes for about 20 years been on insulin  for about 5  Admitted for diabetic foot wound infection  Chronology  7/15 [facility] evaluation cellulitis abscess concerning for soft tissue infection of left foot underwent I&D and partial fifth ray amputation 716 and was treated for strep agalactiae and a PICC line was placed and OPAT was performed-A1c was 12.2 she was told to follow-up in wound care center 10/31 represent to [facility] from wound care center after several visits to wound care office and being told to go to ED-found to be lethargic finally on 10/29 foul-smelling wound etc. etc.-started on antibiotics 11/1 because of diarrhea tested positive for EPEC and treated 11/2 infectious disease consulted 11/5 right-sided BKA Dr. Harden   Assessment  & Plan :    Sepsis on admission secondary to diabetic foot infection with gangrene on admission Surgical cure-ID rec treated through 11/6 Unasyn linezolid Vancocin-completed therapy Avoid IV as hypotensive- Oxy IR-->Dilaudid  4 mg every 4 as needed, IV Dilaudid   0.5 q 8 and will discontinue IV pain meds at time of discharge-patient is aware Adjunctive Zanaflex increased to every 6 --continue gabapentin 800 3 times daily Dermatitis--?  Facial cellulitis Resumed topical ketoconazole-has a bump on the back of her neck that is stable Can be followed by dermatology as an outpatient POTS syndrome Continues midodrine 10 3 times daily meals for hypotension--- avoid IV narcotics EPEC diarrhea--- completed symptomatic management with azithromycin and only supportive care currently Underlying IBS Constipated after addition of scheduled Lomotil 1 tablet 4 times daily  in addition to scheduled Imodium 2 mg 4 times daily so we will back down to Lomotil 1 tab 3 times daily meals as needed Flexi-Seal removed 11/10 patient encouraged to move around Clamp Foley and attempt to remove that as well to facilitate mobility Uncontrolled diabetes mellitus with recent A1c 12 complicated by severe hypoglycemia 11/6 Sliding scale alone 3 times daily with meals given hypoglycemia CBGs are ranging 230-324--unclear compliance Basaglar  30-resume at 10 units today given uncontrolled hyperglycemia She is not on short acting at home-start Amaryl 1 mg a.m.-avoid metformin because of risk of diarrhea in the setting of IBS Electrolyte abnormalities including elevated anion gap on admission Periodic labs-previously resolved Anxiety/depression Continues on Xanax  0.5 --understands only limited Rx at d/c hydroxyzine  50 twice daily Does not appear to be taking any longer Wellbutrin  and BuSpar --we stopped night time elevail Decubitus ulcers on bottom stage II She has a mild decubitus that was visualized on 11/7-offload and up out of bed  Data Reviewed today:    DVT prophylaxis: Lovenox   Status is: Inpatient Inpatient pending eval by PT and postop management by surgery/Ortho    Dispo/Global plan: Probably needs skilled placement declined for CIR  Time 20   Subjective:   Anxious about Flexi-Seal removal-anxiety out of proportion to removal of Flexi-Seal-she still has a little bit of liquid stool but it is much more improved from yesterday Her pain seems moderately controlled She understands the need to move around with therapy to get to skilled facility She is saying she broke out into a cold sweat last night because of straining-I explained that this is normal and probably was  because we did constipate her with all of the medication Overall no big changes   Objective + exam Vitals:   10/26/24 0418 10/26/24 1015 10/26/24 1252 10/26/24 1301  BP: 120/74 (!) 101/58 (!) 88/58  97/65  Pulse: 82 83 85 85  Resp: 18 16 19 18   Temp: 98.1 F (36.7 C) 98 F (36.7 C) 98.7 F (37.1 C)   TempSrc: Oral Oral Oral   SpO2: 97% 96% 96% 96%  Weight:      Height:       Filed Weights   10/17/24 1449 10/21/24 0954 10/25/24 0221  Weight: 63.5 kg 63.5 kg 62.1 kg     Examination:  Affect is flat Chest is clear Dermatitis seems to be improved in upper face and back Abdomen is soft no rebound Wounds not visualized today power is 5/5  Scheduled Meds:  acetaminophen   1,000 mg Oral Q6H   Chlorhexidine Gluconate Cloth  6 each Topical Daily   collagenase   Topical Daily   enoxaparin  (LOVENOX ) injection  40 mg Subcutaneous Q24H   feeding supplement  237 mL Oral BID BM   ferrous sulfate  325 mg Oral QODAY   gabapentin  800 mg Oral TID   [START ON 10/27/2024] glimepiride  1 mg Oral Q breakfast   hydrOXYzine   50 mg Oral BID   insulin  aspart  0-9 Units Subcutaneous TID WC   insulin  glargine-yfgn  10 Units Subcutaneous QHS   ketoconazole  1 Application Topical BID   loperamide  2 mg Oral QID   midodrine  10 mg Oral TID WC   nystatin  5 mL Oral QID   pantoprazole   40 mg Oral Daily   Continuous Infusions:   albuterol, ALPRAZolam , dextrose, diphenoxylate-atropine, hydrALAZINE, HYDROmorphone  (DILAUDID ) injection, HYDROmorphone , labetalol, metoprolol tartrate, ondansetron , mouth rinse, potassium chloride , tiZANidine  Jai-Gurmukh Oluwaferanmi Wain, MD  [facility]

## 2024-10-26 NOTE — Progress Notes (Signed)
 Physical Therapy Treatment Patient Details Name: Phyllis Pittman MRN: [id number] DOB: [birth year] Today's Date: 10/26/2024   History of Present Illness Pt is a 48 y.o female admitted 10/31 for R foot wound. 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 PMH: HTN, sepsis, DM, HTN, HLD, OSA, CVA, fibromyalgia, neuropathy, sciatica, s/p L fifth toe amp, POTS    PT Comments  Pt received in supine, flat affect and pt agreeable to therapy session with heavy encouragement after RN premedicated her for pain and pt took midodrine just prior to session. Pt with symptoms due to lower BP in sitting, but able to tolerate >10 mins seated activity including dynamic seated balance tasks such as donning sock on LLE and lateral lean to prop on L elbow while limb guard donned at EOB. Pt needing up to minA for bed mobility when more fatigued and up to modA for posterior seated scooting after she fatigued from scooting unassisted. Pt very slow to initiate and perform all tasks 2/2 fatigue/pain. Patient will benefit from continued inpatient follow up therapy, <3 hours/day, she is not currently able to perform her own self-care or mobilize OOB unassisted, and remains below PLOF.   If plan is discharge home, recommend the following: A little help with bathing/dressing/bathroom;Assistance with cooking/housework;Assist for transportation;A lot of help with walking and/or transfers;Help with stairs or ramp for entrance (anticipate +2 for standing; pt unable today)   Can travel by private vehicle     No  Equipment Recommendations  Other (comment) (TBA; currently would need hospital bed, hoyer, pressure relief cushion on wheelchair with support for R BKA)    Recommendations for Other Services       Precautions / Restrictions Precautions Precautions: Fall Recall of Precautions/Restrictions: Intact Precaution/Restrictions Comments: Watch BP Required Braces or  Orthoses: Other Brace Other Brace: limb guard right residual limb Restrictions Weight Bearing Restrictions Per Provider Order: Yes RLE Weight Bearing Per Provider Order: Non weight bearing     Mobility  Bed Mobility Overal bed mobility: Needs Assistance Bed Mobility: Supine to Sit, Sit to Supine     Supine to sit: Contact guard, HOB elevated, Used rails Sit to supine: HOB elevated, Used rails, Min assist, +2 for safety/equipment   General bed mobility comments: CGA for line management. Incr time for pt to come to L EOB and minA for LLE support returning to supine. +2 safety due to pt c/o lightheadedness/fatigue and some decreased trunk control returning to supine.    Transfers Overall transfer level: Needs assistance Equipment used: None Transfers: Bed to chair/wheelchair/BSC       Squat pivot transfers: Min assist, From elevated surface, +2 safety/equipment, Contact guard assist Anterior-Posterior transfers: Mod assist   General transfer comment: scooting toward drop arm chair on her L side, pt tending to place BUE in front of her while leaning toward her R side, then propping on BUE and lifting bottom up/back and toward her L (likely 2/2 decreased BUE shoulder and tricep strength). Pt defers PTA assist via bed pad until after third scoot, when she c/o increased dizziness/nausea and agreeable to x2 posterior scoots further back on to bed, needing up to modA to achieve propulsion via bed pad assist. Pt encouraged to trial stand pivot tf prior to scooting today but not agreeable due to symptoms of lightheadedness sitting EOB.    Ambulation/Gait  Stairs             Wheelchair Mobility     Tilt Bed    Modified Rankin (Stroke Patients Only)       Balance Overall balance assessment: Needs assistance Sitting-balance support: No upper extremity supported, Feet supported, Bilateral upper extremity supported, Single extremity supported Sitting  balance-Leahy Scale: Fair Sitting balance - Comments: Sat EOB for >10 min with supervision.  BP drop with symptoms, see above   Standing balance support: Bilateral upper extremity supported, During functional activity   Standing balance comment: pt defers to attempt today                            Communication Communication Communication: No apparent difficulties  Cognition Arousal: Alert Behavior During Therapy: Flat affect   PT - Cognitive impairments: Safety/Judgement, Problem solving, Initiation                       PT - Cognition Comments: Slow processing, self-directed. Following commands: Intact      Cueing Cueing Techniques: Verbal cues, Tactile cues, Gestural cues  Exercises General Exercises - Lower Extremity Ankle Circles/Pumps: AROM, Left, 5 reps, Supine Quad Sets: AROM, Both, 5 reps, Supine Heel Slides: AROM, Left, 5 reps, Supine Other Exercises Other Exercises: seated RLE AROM: knee flex/ext x5 reps at EOB    General Comments General comments (skin integrity, edema, etc.): see BP in comments above; area of skin breakdown at top of RLE shrinker sock needs foam dressing, RN notified after session.      Pertinent Vitals/Pain Pain Assessment Pain Assessment: 0-10 Pain Score: 8  Pain Location: RLE Pain Descriptors / Indicators: Grimacing, Guarding, Discomfort Pain Intervention(s): Limited activity within patient's tolerance, Monitored during session, Premedicated before session, Repositioned, Other (comment) (c/o phantom pain)    Home Living                          Prior Function            PT Goals (current goals can now be found in the care plan section) Acute Rehab PT Goals Patient Stated Goal: to go home PT Goal Formulation: With patient Time For Goal Achievement: 11/05/24 Progress towards PT goals: Progressing toward goals (slowly)    Frequency    Min 2X/week      PT Plan      Co-evaluation PT/OT/SLP  Co-Evaluation/Treatment: Yes Reason for Co-Treatment: For patient/therapist safety;To address functional/ADL transfers PT goals addressed during session: Balance;Strengthening/ROM;Mobility/safety with mobility        AM-PAC PT 6 Clicks Mobility   Outcome Measure  Help needed turning from your back to your side while in a flat bed without using bedrails?: None Help needed moving from lying on your back to sitting on the side of a flat bed without using bedrails?: A Little Help needed moving to and from a bed to a chair (including a wheelchair)?: A Lot Help needed standing up from a chair using your arms (e.g., wheelchair or bedside chair)?: Total Help needed to walk in hospital room?: Total Help needed climbing 3-5 steps with a railing? : Total 6 Click Score: 12    End of Session   Activity Tolerance: Patient tolerated treatment well;Patient limited by pain;Treatment limited secondary to medical complications (Comment);Other (comment) (symptoms from lower BP while seated) Patient left: with call bell/phone within reach;in bed;with bed alarm set;Other (comment) (sidelying toward  her R) Nurse Communication: Mobility status;Need for lift equipment;Precautions;Other (comment) (skin breakdown) PT Visit Diagnosis: Muscle weakness (generalized) (M62.81)     Time: 8599-8565 PT Time Calculation (min) (ACUTE ONLY): 34 min  Charges:    $Therapeutic Activity: 8-22 mins PT General Charges $$ ACUTE PT VISIT: 1 Visit                     Ladarien Beeks P., PTA Acute Rehabilitation Services Secure Chat Preferred 9a-5:30pm Office: [phone]    Connell HERO [facility] 10/26/2024, 3:20 PM

## 2024-10-26 NOTE — Progress Notes (Signed)
 Patient was complaining of constipation, abdominal pain and rectal pressure. Fexicoel discontinued while MD was on bedside. Plan of care continues.

## 2024-10-26 NOTE — TOC Progression Note (Signed)
 Transition of Care [facility]) - Progression Note    Patient Details  Name: Phyllis Pittman MRN: [id number] Date of Birth: Nov 29, 1976  [facility]) CM/SW Contact  Montie LOISE Louder, [place] Phone Number: 10/26/2024, 4:37 PM  Clinical Narrative:     Insurance authorization approved from  11/10 - 11/12 [employer] Mayme Barrows PI#[id number] - Informed SNF of anticipated d/c tomorrow- waiting on response   Montie Louder, MSW, LCSW Clinical Social Worker      Barriers to Discharge: Continued Medical Work up               [facility]                                               Social Drivers of Health (SDOH) Interventions SDOH Screenings   Food Insecurity: Patient Declined (10/18/2024)  Housing: Unknown (10/18/2024)  Transportation Needs: Unknown (10/18/2024)  Utilities: Patient Declined (10/18/2024)  Social Connections: Unknown (04/20/2022)   Received from [facility]  Tobacco Use: High Risk (10/21/2024)    Readmission Risk Interventions     No data to display

## 2024-10-26 NOTE — TOC Progression Note (Addendum)
 Transition of Care [facility]) - Progression Note    Patient Details  Name: Phyllis Pittman MRN: [id number] Date of Birth: 1976/12/17  Transition of Care [facility]) CM/SW Contact  Montie LOISE Louder, [place] Phone Number: 10/26/2024, 12:47 PM  Clinical Narrative:     CSW met with patient at bedside. CSW provided SNF bed offers and verbally informed of Medicare.gov star ratings. Patient choose [facility]. CSW explained the SNF process. She will need insurance authorization for SNF before she can d/c to SNF.   [facility] confirmed availability.  PSARR# received  [id number]  TOC will continue to follow and assist with discharge planning.  Montie Louder, MSW, LCSW Clinical Social Worker      Barriers to Discharge: Continued Medical Work up               [facility]                                               Social Drivers of Health (SDOH) Interventions SDOH Screenings   Food Insecurity: Patient Declined (10/18/2024)  Housing: Unknown (10/18/2024)  Transportation Needs: Unknown (10/18/2024)  Utilities: Patient Declined (10/18/2024)  Social Connections: Unknown (04/20/2022)   Received from [facility]  Tobacco Use: High Risk (10/21/2024)    Readmission Risk Interventions     No data to display

## 2024-10-27 DIAGNOSIS — M869 Osteomyelitis, unspecified: Secondary | ICD-10-CM | POA: Diagnosis not present

## 2024-10-27 LAB — GLUCOSE, CAPILLARY
Glucose-Capillary: 260 mg/dL — ABNORMAL HIGH (ref 70–99)
Glucose-Capillary: 324 mg/dL — ABNORMAL HIGH (ref 70–99)
Glucose-Capillary: 224 mg/dL — ABNORMAL HIGH (ref 70–99)
Glucose-Capillary: 418 mg/dL — ABNORMAL HIGH (ref 70–99)
Glucose-Capillary: 382 mg/dL — ABNORMAL HIGH (ref 70–99)

## 2024-10-27 LAB — CBC WITH DIFFERENTIAL/PLATELET
Abs Immature Granulocytes: 0.18 K/uL — ABNORMAL HIGH (ref 0.00–0.07)
Basophils Absolute: 0.1 K/uL (ref 0.0–0.1)
Basophils Relative: 1 %
Eosinophils Absolute: 0.3 K/uL (ref 0.0–0.5)
Eosinophils Relative: 3 %
HCT: 30 % — ABNORMAL LOW (ref 36.0–46.0)
Hemoglobin: 9.6 g/dL — ABNORMAL LOW (ref 12.0–15.0)
Immature Granulocytes: 2 %
Lymphocytes Relative: 37 %
Lymphs Abs: 4.6 K/uL — ABNORMAL HIGH (ref 0.7–4.0)
MCH: 25.2 pg — ABNORMAL LOW (ref 26.0–34.0)
MCHC: 32 g/dL (ref 30.0–36.0)
MCV: 78.7 fL — ABNORMAL LOW (ref 80.0–100.0)
Monocytes Absolute: 1 K/uL (ref 0.1–1.0)
Monocytes Relative: 8 %
Neutro Abs: 6.1 K/uL (ref 1.7–7.7)
Neutrophils Relative %: 49 %
Platelets: 564 K/uL — ABNORMAL HIGH (ref 150–400)
RBC: 3.81 MIL/uL — ABNORMAL LOW (ref 3.87–5.11)
RDW: 15.5 % (ref 11.5–15.5)
WBC: 12.3 K/uL — ABNORMAL HIGH (ref 4.0–10.5)
nRBC: 0 % (ref 0.0–0.2)

## 2024-10-27 LAB — BASIC METABOLIC PANEL WITH GFR
Anion gap: 11 (ref 5–15)
BUN: 9 mg/dL (ref 6–20)
CO2: 27 mmol/L (ref 22–32)
Calcium: 8.4 mg/dL — ABNORMAL LOW (ref 8.9–10.3)
Chloride: 92 mmol/L — ABNORMAL LOW (ref 98–111)
Creatinine, Ser: 0.41 mg/dL — ABNORMAL LOW (ref 0.44–1.00)
GFR, Estimated: >60 mL/min (ref >60)
Glucose, Bld: 275 mg/dL — ABNORMAL HIGH (ref 70–99)
Potassium: 4.4 mmol/L (ref 3.5–5.1)
Sodium: 130 mmol/L — ABNORMAL LOW (ref 135–145)

## 2024-10-27 MED ORDER — GLIMEPIRIDE 4 MG PO TABS
4.0000 mg | ORAL_TABLET | Freq: Every day | ORAL | Status: DC
  Administered 2024-10-28 – 2024-10-31 (×4): 4 mg via ORAL
  Filled 2024-10-27 (×5): qty 1

## 2024-10-27 MED ORDER — PERMETHRIN 1 % EX LIQD
Freq: Every day | CUTANEOUS | Status: DC
  Filled 2024-10-27 (×2): qty 59

## 2024-10-27 MED ORDER — INSULIN GLARGINE-YFGN 100 UNIT/ML ~~LOC~~ SOLN
16.0000 [IU] | Freq: Every day | SUBCUTANEOUS | Status: DC
  Administered 2024-10-27: 16 [IU] via SUBCUTANEOUS
  Filled 2024-10-27 (×2): qty 0.16

## 2024-10-27 NOTE — Inpatient Diabetes Management (Signed)
 Inpatient Diabetes Program Recommendations  AACE/ADA: New Consensus Statement on Inpatient Glycemic Control (2015)  Target Ranges:  Prepandial:   less than 140 mg/dL      Peak postprandial:   less than 180 mg/dL (1-2 hours)      Critically ill patients:  140 - 180 mg/dL   Lab Results  Component Value Date   GLUCAP 324 (H) 10/27/2024   HGBA1C >15.5 (H) 10/19/2024    Review of Glycemic Control  Latest Reference Range & Units 10/26/24 06:10 10/26/24 12:46 10/26/24 16:13 10/26/24 20:23 10/27/24 06:04 10/27/24 10:52  Glucose-Capillary 70 - 99 mg/dL 729 (H) 761 (H) 655 (H) 280 (H) 260 (H) 324 (H)  (H): Data is abnormally high  Diabetes history: DM Outpatient Diabetes medications: Tresiba 16 units every day, Humalog 3-5 units TID Current orders for Inpatient glycemic control: Amaryl 1 mg every day, Semglee  10 units every day, Novolog  0-9 units TID  Inpatient Diabetes Program Recommendations:    Please consider:  Semglee  16 units every day (home dose)  Thank you, Wyvonna Pinal, MSN, CDCES Diabetes Coordinator Inpatient Diabetes Program [phone] (team pager from 8a-5p)

## 2024-10-27 NOTE — Progress Notes (Signed)
 TRH   ROUNDING   NOTE Phyllis Pittman FMW:[id number]  DOB: [birth year]  DOA: 10/16/2024  PCP: [name], PA  10/27/2024,5:20 PM  LOS: 11 days    Code Status: Full code     from: Home   48 year old female with poorly controlled diabetes mellitus HTN HLD diabetic neuropathy Has had diabetes for about 20 years been on insulin  for about 5  Admitted for diabetic foot wound infection  Chronology  7/15 [facility] evaluation cellulitis abscess concerning for soft tissue infection of left foot underwent I&D and partial fifth ray amputation 716 and was treated for strep agalactiae and a PICC line was placed and OPAT was performed-A1c was 12.2 she was told to follow-up in wound care center 10/31 represent to [facility] Long from wound care center after several visits to wound care office and being told to go to ED-found to be lethargic finally on 10/29 foul-smelling wound etc. etc.-started on antibiotics 11/1 because of diarrhea tested positive for EPEC and treated 11/2 infectious disease consulted 11/5 right-sided BKA Dr. Harden   Assessment  & Plan :    Sepsis on admission secondary to diabetic foot infection with gangrene on admission Surgical cure-ID rec treated through 11/6 Unasyn linezolid Vancocin-completed therapy Avoid IV as hypotensive- Oxy IR-->Dilaudid  4 mg every 4 as needed IV Dilaudid   0.5 q 8 and will discontinue IV pain meds at time of discharge-patient is aware Zanaflex increased to every 6 --continue gabapentin 800 3 times daily Patient can go to tele and is otherwise stable Lice? Noticed by nursing.  Permethrin x 3 days--can d/c to snf once cleared Dermatitis--?  Facial cellulitis Resumed topical ketoconazole-has a bump on the back of her neck that is stable Can be followed by dermatology as an outpatient POTS syndrome Continues midodrine 10 3 times daily meals for hypotension--- avoid IV narcotics Was supposed to have tilt table testing at [place]--OP follow up. EPEC  diarrhea--- completed symptomatic management with azithromycin and only supportive care currently Underlying IBS Constipation resolved  back down to Lomotil 1 tab 3 times daily meals as needed--continued scheduled 2 mg qid Imodium Flexi-Seal removed 11/10 patient encouraged to move around Urinary retention Foley clamp 11/10 unsuccessful---keep foley at d/c Uncontrolled diabetes mellitus with recent A1c 12 complicated by severe hypoglycemia 11/6 --unclear compliance Basaglar  30-resume 16 U Long acting, continue SSI --add ,mealtime  She is not on short acting at home-increase Amaryl 1 mg a.m-->4 mg q am .-avoid metformin because of risk of diarrhea in the setting of IBS Electrolyte abnormalities including elevated anion gap on admission Periodic labs-previously resolved Anxiety/depression Continues on Xanax  0.5 --understands only limited Rx at d/c hydroxyzine  50 twice daily Does not appear to be taking any longer Wellbutrin  and BuSpar --we stopped night time elevail Decubitus ulcers on bottom stage II She has a mild decubitus that was visualized on 11/7-offload and up out of bed  Data Reviewed today:    DVT prophylaxis: Lovenox   Status is: Inpatient Inpatient pending eval by PT and postop management by surgery/Ortho    Dispo/Global plan: Probably needs skilled placement declined for CIR  Time 20   Subjective:   Has lice No other c/o Pain seems mod controlled Worried about her hypotension She is ok for med-tele, and can be downgraded  Objective + exam Vitals:   10/27/24 0358 10/27/24 0407 10/27/24 0733 10/27/24 1054  BP:  (!) 81/50 (!) 76/55 (!) 88/55  Pulse:  77 73 75  Resp:  16 15 13   Temp: 97.6 F (  36.4 C) 97.9 F (36.6 C) 98.5 F (36.9 C) (!) 97.4 F (36.3 C)  TempSrc: Oral Oral Oral Oral  SpO2:  98% 98% 96%  Weight:      Height:       Filed Weights   10/17/24 1449 10/21/24 0954 10/25/24 0221  Weight: 63.5 kg 63.5 kg 62.1 kg     Examination:  Affect is  flat Chest is clear Dermatitis seems to be improved in upper face and back Abdomen is soft no rebound Wounds not visualized  power is 5/5  Scheduled Meds:  acetaminophen   1,000 mg Oral Q6H   Chlorhexidine Gluconate Cloth  6 each Topical Daily   collagenase   Topical Daily   enoxaparin  (LOVENOX ) injection  40 mg Subcutaneous Q24H   feeding supplement  237 mL Oral BID BM   ferrous sulfate  325 mg Oral QODAY   gabapentin  800 mg Oral TID   [START ON 10/28/2024] glimepiride  4 mg Oral Q breakfast   hydrOXYzine   50 mg Oral BID   insulin  aspart  0-9 Units Subcutaneous TID WC   insulin  glargine-yfgn  16 Units Subcutaneous QHS   ketoconazole  1 Application Topical BID   loperamide  2 mg Oral QID   midodrine  10 mg Oral TID WC   nystatin  5 mL Oral QID   pantoprazole   40 mg Oral Daily   permethrin   Topical Q0600   Continuous Infusions:   albuterol, ALPRAZolam , dextrose, diphenoxylate-atropine, hydrALAZINE, HYDROmorphone  (DILAUDID ) injection, HYDROmorphone , labetalol, metoprolol tartrate, ondansetron , mouth rinse, potassium chloride , tiZANidine  Jai-Gurmukh Lekeshia Kram, MD  [facility]

## 2024-10-28 DIAGNOSIS — B850 Pediculosis due to Pediculus humanus capitis: Secondary | ICD-10-CM | POA: Diagnosis not present

## 2024-10-28 LAB — GLUCOSE, CAPILLARY
Glucose-Capillary: 229 mg/dL — ABNORMAL HIGH (ref 70–99)
Glucose-Capillary: 234 mg/dL — ABNORMAL HIGH (ref 70–99)
Glucose-Capillary: 286 mg/dL — ABNORMAL HIGH (ref 70–99)
Glucose-Capillary: 185 mg/dL — ABNORMAL HIGH (ref 70–99)

## 2024-10-28 LAB — CBC WITH DIFFERENTIAL/PLATELET
Abs Immature Granulocytes: 0.14 K/uL — ABNORMAL HIGH (ref 0.00–0.07)
Basophils Absolute: 0.1 K/uL (ref 0.0–0.1)
Basophils Relative: 1 %
Eosinophils Absolute: 0.3 K/uL (ref 0.0–0.5)
Eosinophils Relative: 3 %
HCT: 32.3 % — ABNORMAL LOW (ref 36.0–46.0)
Hemoglobin: 10.2 g/dL — ABNORMAL LOW (ref 12.0–15.0)
Immature Granulocytes: 1 %
Lymphocytes Relative: 39 %
Lymphs Abs: 4.4 K/uL — ABNORMAL HIGH (ref 0.7–4.0)
MCH: 25 pg — ABNORMAL LOW (ref 26.0–34.0)
MCHC: 31.6 g/dL (ref 30.0–36.0)
MCV: 79.2 fL — ABNORMAL LOW (ref 80.0–100.0)
Monocytes Absolute: 0.8 K/uL (ref 0.1–1.0)
Monocytes Relative: 7 %
Neutro Abs: 5.6 K/uL (ref 1.7–7.7)
Neutrophils Relative %: 49 %
Platelets: 555 K/uL — ABNORMAL HIGH (ref 150–400)
RBC: 4.08 MIL/uL (ref 3.87–5.11)
RDW: 15.9 % — ABNORMAL HIGH (ref 11.5–15.5)
WBC: 11.3 K/uL — ABNORMAL HIGH (ref 4.0–10.5)
nRBC: 0 % (ref 0.0–0.2)

## 2024-10-28 LAB — BASIC METABOLIC PANEL WITH GFR
Anion gap: 12 (ref 5–15)
BUN: 13 mg/dL (ref 6–20)
CO2: 26 mmol/L (ref 22–32)
Calcium: 8.6 mg/dL — ABNORMAL LOW (ref 8.9–10.3)
Chloride: 94 mmol/L — ABNORMAL LOW (ref 98–111)
Creatinine, Ser: 0.57 mg/dL (ref 0.44–1.00)
GFR, Estimated: >60 mL/min (ref >60)
Glucose, Bld: 221 mg/dL — ABNORMAL HIGH (ref 70–99)
Potassium: 5 mmol/L (ref 3.5–5.1)
Sodium: 132 mmol/L — ABNORMAL LOW (ref 135–145)

## 2024-10-28 MED ORDER — INSULIN ASPART 100 UNIT/ML IJ SOLN
0.0000 [IU] | Freq: Three times a day (TID) | INTRAMUSCULAR | Status: DC
  Administered 2024-10-28: 8 [IU] via SUBCUTANEOUS
  Administered 2024-10-29 – 2024-10-30 (×2): 5 [IU] via SUBCUTANEOUS
  Administered 2024-10-30: 8 [IU] via SUBCUTANEOUS
  Administered 2024-10-31 (×2): 3 [IU] via SUBCUTANEOUS
  Filled 2024-10-28: qty 6
  Filled 2024-10-28: qty 8
  Filled 2024-10-28: qty 5
  Filled 2024-10-28: qty 8
  Filled 2024-10-28: qty 3
  Filled 2024-10-28: qty 5

## 2024-10-28 MED ORDER — INSULIN GLARGINE-YFGN 100 UNIT/ML ~~LOC~~ SOLN
20.0000 [IU] | Freq: Every day | SUBCUTANEOUS | Status: DC
  Administered 2024-10-28 – 2024-10-29 (×2): 20 [IU] via SUBCUTANEOUS
  Filled 2024-10-28 (×3): qty 0.2

## 2024-10-28 MED ORDER — PERMETHRIN 1 % EX LIQD
Freq: Every day | CUTANEOUS | Status: DC
Start: 2024-10-28 — End: 2024-10-30
  Filled 2024-10-28 (×2): qty 59

## 2024-10-28 MED ORDER — INSULIN ASPART 100 UNIT/ML IJ SOLN
0.0000 [IU] | Freq: Every day | INTRAMUSCULAR | Status: DC
  Administered 2024-10-29: 4 [IU] via SUBCUTANEOUS
  Administered 2024-10-30: 3 [IU] via SUBCUTANEOUS
  Filled 2024-10-28: qty 5
  Filled 2024-10-28: qty 3

## 2024-10-28 MED ORDER — INSULIN ASPART 100 UNIT/ML IJ SOLN
5.0000 [IU] | Freq: Once | INTRAMUSCULAR | Status: AC
  Administered 2024-10-28: 5 [IU] via SUBCUTANEOUS
  Filled 2024-10-28: qty 5

## 2024-10-28 NOTE — Inpatient Diabetes Management (Signed)
 Inpatient Diabetes Program Recommendations  AACE/ADA: New Consensus Statement on Inpatient Glycemic Control (2015)  Target Ranges:  Prepandial:   less than 140 mg/dL      Peak postprandial:   less than 180 mg/dL (1-2 hours)      Critically ill patients:  140 - 180 mg/dL   Lab Results  Component Value Date   GLUCAP 234 (H) 10/28/2024   HGBA1C >15.5 (H) 10/19/2024    Review of Glycemic Control  Latest Reference Range & Units 10/27/24 06:04 10/27/24 10:52 10/27/24 17:32 10/27/24 21:23 10/27/24 23:22 10/28/24 07:32 10/28/24 11:28  Glucose-Capillary 70 - 99 mg/dL 739 (H) 675 (H) 775 (H) 418 (H) 382 (H) 229 (H) 234 (H)   Diabetes history: DM Outpatient Diabetes medications: Tresiba 16 units every day, Humalog 3-5 units TID Current orders for Inpatient glycemic control:  Amaryl 4 mg Daily Semglee  16 units Daily Novolog  0-9 units TID  Ensure plus high protein (19 grams of carbohydrate)  Inpatient Diabetes Program Recommendations:    -   consider adding Novolog  HS scale  Note: Semglee  and Amaryl increased yesterday for today's doses. Watch for now.  Thanks,  Clotilda Bull RN, MSN, BC-ADM Inpatient Diabetes Coordinator Team Pager [phone] (8a-5p)

## 2024-10-28 NOTE — Plan of Care (Signed)
  Problem: Skin Integrity: Goal: Risk for impaired skin integrity will decrease Outcome: Progressing   Problem: Skin Integrity: Goal: Skin integrity will improve Outcome: Progressing   Problem: Pain Managment: Goal: General experience of comfort will improve and/or be controlled Outcome: Progressing   Problem: Safety: Goal: Ability to remain free from injury will improve Outcome: Progressing

## 2024-10-28 NOTE — TOC Progression Note (Addendum)
 Transition of Care [facility]) - Progression Note    Patient Details  Name: Phyllis Pittman MRN: [id number] Date of Birth: 01/14/1976  Transition of [facility]) CM/SW Contact  Bridget Cordella Simmonds, LCSW Phone Number: 10/28/2024, 10:24 AM  Clinical Narrative:   CSW spoke with Tammy/[facility]: they require the pt to complete treatment for lice and be clear before she can admit.    1100: CSW spoke with Earle/Pharmacy: typically lice treatment is once once and if this does not work it would not be repeated for 7 days.  Usually not ordered as a daily treatment. Once treatment is done and all of the eggs are confirmed gone, the pt is clear.      Barriers to Discharge: Continued Medical Work up               Expected Discharge Plan and Services                                               Social Drivers of Health (SDOH) Interventions SDOH Screenings   Food Insecurity: Patient Declined (10/18/2024)  Housing: Unknown (10/18/2024)  Transportation Needs: Unknown (10/18/2024)  Utilities: Patient Declined (10/18/2024)  Social Connections: Unknown (04/20/2022)   Received from [facility]  Tobacco Use: High Risk (10/21/2024)    Readmission Risk Interventions     No data to display

## 2024-10-28 NOTE — Progress Notes (Signed)
 PROGRESS NOTE   Phyllis Pittman  FMW:[id number]    DOB: 08-01-1976    DOA: 10/16/2024  PCP: [name], PA   I have briefly reviewed patients previous medical records in [facility].   Brief Hospital Course:  48 year old female with medical history significant for type II DM with neuropathy, HTN, HLD, CVA, GERD, anxiety and depression, recently treated for left foot infection, partial fifth ray amputation, prolonged IV antibiotics via PICC line, seen at wound care center the week prior to this admission, found to have a new wound with fever at that time, right foot was swollen and erythematous up to the ankle with congested discolored small toe with multiple open wounds.  10/22, she was advised to go to the ED for further evaluation and I&D but patient did not go.  She return to wound care center on 10/29, her right foot was necrotic and she was advised to return to ED.  She again declined but returned 2 days later via EMS with foul-smelling drainage, pain and lethargic.  She was admitted due to sepsis secondary to right foot gangrene.  Vascular surgery and orthopedics consulted.  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  Currently medically optimized for DC to SNF pending completion of therapy for her Lice.   Assessment & Plan:   Sepsis, POA Secondary to diabetic right foot infection with gangrene History and details as noted above. Vascular surgery, orthopedics and ID consulted. 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 Harden on 11/5. As per Dr. Harden follow-up 11/8, wound VAC removed and dry dressing applied.  Currently has stump shrinker. Completed a course of IV Unasyn and linezolid on 11/6.  Blood cultures negative. Multimodality pain control.  Judicious use of opioids. Therapies recommended SNF with plans to DC  to SNF pending completion of treatment for lice  Lice infestation On permethrin topically daily x 3 days.  As per RN update 11/12, still had lice eggs in her hair.  Facial dermatitis,?  Cellulitis Topical ketoconazole Outpatient dermatology consultation and follow-up  POTS syndrome Intermittent soft blood pressures Continue midodrine 10 mg 3 times daily. Was supposed to have tilt table testing at [facility] be followed up outpatient  EPEC diarrhea Completed course of azithromycin.  Supportive care  IBS history Complicated by EPEC diarrhea as noted above.  Had Flexi-Seal which was removed 11/10. Continue bowel regimen  Urinary retention Attempt to clamp Foley catheter 11/10 was unsuccessful.  Plan was to discharge her with a Foley catheter.  However since she is still going to be here another day, will attempt trial again today.  Uncontrolled type II DM A1c >15.5 on 11/3 Currently on Semglee  16 units nightly, sensitive SSI without bedtime scale and Amaryl with suboptimal control.  CBGs mostly in the mid 200s and even up to 382, 418 last night Increase Semglee  to 20 units at bedtime, change SSI to moderate sensitivity with bedtime scale and consider mealtime NovoLog .  Anxiety and depression Continue Atarax   Anemia Stable  Pseudohyponatremia  Wound 10/16/24 1830 Pressure Injury Buttocks Upper Stage 2 -  Partial thickness loss of dermis presenting as a shallow open injury with a red, pink wound bed without slough. (Active)     Wound 10/16/24 1830 Pressure Injury Sacrum Medial;Lower Stage 2 -  Partial thickness loss of dermis presenting as a shallow open  injury with a red, pink wound bed without slough. (Active)  Frequent change of positions, improve nutrition and mobilize.   Body mass index is 26.74 kg/m.   DVT prophylaxis: SCD's Start: 10/21/24 1336 enoxaparin  (LOVENOX ) injection 40 mg Start: 10/16/24 2000 SCDs Start: 10/16/24 1750 SCDs Start: 10/16/24  1749     Code Status: Full Code:  Family Communication: None at bedside Disposition:  Status is: Inpatient Remains inpatient appropriate because: Ongoing lice treatment   Communicated extensively with ICM, patient's RN and pharmacy regarding Lice Rx  Consultants:   Vascular surgery Orthopedics Infectious disease  Procedures:   As above  Subjective:  Some left foot pain.  Otherwise no pain reported.  States that she has lost a lot of her hair.  Per RN, noted Lice eggs in her hair.  Objective:   Vitals:   10/27/24 2311 10/28/24 0350 10/28/24 0752 10/28/24 1133  BP: 98/70 96/67 92/65  102/67  Pulse: 77 78 80 89  Resp: 20 15 16 16   Temp: 98.8 F (37.1 C) 98 F (36.7 C) 99 F (37.2 C) 98 F (36.7 C)  TempSrc: Oral     SpO2: 100% 96% 97% 98%  Weight:      Height:        General exam: Young female, small built, frail, chronically ill looking sitting up comfortably in bed without distress. Respiratory system: Clear to auscultation. Respiratory effort normal. Cardiovascular system: S1 & S2 heard, RRR. No JVD, murmurs, rubs, gallops or clicks. No pedal edema. Gastrointestinal system: Abdomen is nondistended, soft and nontender. No organomegaly or masses felt. Normal bowel sounds heard. Central nervous system: Alert and oriented. No focal neurological deficits. Extremities: Symmetric 5 x 5 power.  Left foot dressing clean and dry.  Right BKA stump with stump shrinker. Skin: No rashes, lesions or ulcers Psychiatry: Judgement and insight appear normal. Mood & affect appropriate.     Data Reviewed:   I have personally reviewed following labs and imaging studies   CBC: Recent Labs  Lab 10/23/24 0258 10/27/24 0337 10/28/24 0910  WBC 12.5* 12.3* 11.3*  NEUTROABS  --  6.1 5.6  HGB 8.7* 9.6* 10.2*  HCT 27.9* 30.0* 32.3*  MCV 81.1 78.7* 79.2*  PLT 577* 564* 555*    Basic Metabolic Panel: Recent Labs  Lab 10/22/24 0332 10/23/24 0258 10/24/24 0344 10/27/24 0337  10/28/24 0508  NA 131* 131* 131* 130* 132*  K 3.6 4.1 4.1 4.4 5.0  CL 96* 95* 94* 92* 94*  CO2 26 28 28 27 26   GLUCOSE 109* 69* 163* 275* 221*  BUN <5* <5* <5* 9 13  CREATININE 0.57 0.43* 0.51 0.41* 0.57  CALCIUM 7.3* 7.3* 7.8* 8.4* 8.6*  MG 1.5* 2.1  --   --   --     Liver Function Tests: No results for input(s): AST, ALT, ALKPHOS, BILITOT, PROT, ALBUMIN in the last 168 hours.  CBG: Recent Labs  Lab 10/27/24 2322 10/28/24 0732 10/28/24 1128  GLUCAP 382* 229* 234*    Microbiology Studies:   Recent Results (from the past 240 hours)  C Difficile Quick Screen w PCR reflex     Status: None   Collection Time: 10/18/24  3:43 PM   Specimen: STOOL  Result Value Ref Range Status   C Diff antigen NEGATIVE NEGATIVE Final   C Diff toxin NEGATIVE NEGATIVE Final   C Diff interpretation No C. difficile detected.  Final    Comment: Performed at [facility], [address] [place], [place]  [address]  Culture, blood (Routine X 2) w Reflex to ID Panel     Status: None   Collection Time: 10/20/24  7:32 PM   Specimen: BLOOD  Result Value Ref Range Status   Specimen Description BLOOD SITE NOT SPECIFIED  Final   Special Requests   Final    BOTTLES DRAWN AEROBIC AND ANAEROBIC Blood Culture adequate volume   Culture   Final    NO GROWTH 5 DAYS Performed at [facility], [address], [place], [place] [address]    Report Status 10/25/2024 FINAL  Final  Culture, blood (Routine X 2) w Reflex to ID Panel     Status: None   Collection Time: 10/20/24  7:35 PM   Specimen: BLOOD  Result Value Ref Range Status   Specimen Description BLOOD SITE NOT SPECIFIED  Final   Special Requests   Final    BOTTLES DRAWN AEROBIC AND ANAEROBIC Blood Culture adequate volume   Culture   Final    NO GROWTH 5 DAYS Performed at [facility], [address], [place], [place] [address]    Report Status 10/25/2024 FINAL  Final    Radiology Studies:  No results  found.  Scheduled Meds:    acetaminophen   1,000 mg Oral Q6H   Chlorhexidine Gluconate Cloth  6 each Topical Daily   collagenase   Topical Daily   enoxaparin  (LOVENOX ) injection  40 mg Subcutaneous Q24H   feeding supplement  237 mL Oral BID BM   ferrous sulfate  325 mg Oral QODAY   gabapentin  800 mg Oral TID   glimepiride  4 mg Oral Q breakfast   hydrOXYzine   50 mg Oral BID   insulin  aspart  0-9 Units Subcutaneous TID WC   insulin  glargine-yfgn  16 Units Subcutaneous QHS   ketoconazole  1 Application Topical BID   loperamide  2 mg Oral QID   midodrine  10 mg Oral TID WC   nystatin  5 mL Oral QID   pantoprazole   40 mg Oral Daily   permethrin   Topical Q0600    Continuous Infusions:     LOS: 12 days     Trenda Mar, MD,  FACP, [facility], [facility]   [facility] Advisor [place]      To contact the attending provider between 7A-7P or the covering provider during after hours 7P-7A, please log into the web site [email or web address] and access using universal [place] password for that web site. If you do not have the password, please call the hospital operator.  10/28/2024, 1:41 PM

## 2024-10-28 NOTE — Care Management Important Message (Signed)
 Important Message  Patient Details  Name: Phyllis Pittman MRN: [id number] Date of Birth: 1976-02-13   Important Message Given:  Yes - Medicare IM     [name], LCSW 10/28/2024, 9:42 AM

## 2024-10-28 NOTE — Progress Notes (Signed)
 Occupational Therapy Treatment Patient Details Name: Phyllis Pittman MRN: [id number] DOB: [birth year] Today's Date: 10/28/2024   History of present illness Pt is a 48 y.o female admitted 10/31 for R foot wound. 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 PMH: HTN, sepsis, DM, HTN, HLD, OSA, CVA, fibromyalgia, neuropathy, sciatica, s/p L fifth toe amp, POTS   OT comments  Pt is progressing well towards OT established goals. Focus of session on progressing functional mobility, increasing independence with ADL task engagement, and education on residual limb care. Pt required up to Mod A +2 for stand pivot transfer to [facility]. Pt continues to require up to Max A for ADL toileting tasks and up to Mod A for dressing and bathing tasks. OT to continue per POC. Current d/c recommendation remains appropriate.       If plan is discharge home, recommend the following:  A lot of help with walking and/or transfers;A lot of help with bathing/dressing/bathroom;Assistance with cooking/housework   Equipment Recommendations  Other (comment) (defer)    Recommendations for Other Services      Precautions / Restrictions Precautions Precautions: Fall Recall of Precautions/Restrictions: Intact Precaution/Restrictions Comments: Watch BP Required Braces or Orthoses: Other Brace Other Brace: limb guard right residual limb Restrictions Weight Bearing Restrictions Per Provider Order: Yes RLE Weight Bearing Per Provider Order: Non weight bearing       Mobility Bed Mobility Overal bed mobility: Needs Assistance Bed Mobility: Supine to Sit, Sit to Supine     Supine to sit: Supervision, HOB elevated, Used rails Sit to supine: Min assist, Used rails   General bed mobility comments: Pt requested supine to sit EOB without physical assistance. Pt required supervision and occassional management of wound vac and cathete lines. Pt required increased time for  bed mobility. Pt able to return to supine from sitting EOB with Min A to reposition in bed with body in midline and lines in appropriate places.    Transfers Overall transfer level: Needs assistance Equipment used: Rolling walker (2 wheels) Transfers: Sit to/from Stand, Bed to chair/wheelchair/BSC Sit to Stand: Mod assist, +2 physical assistance, From elevated surface Stand pivot transfers: Mod assist, +2 safety/equipment, +2 physical assistance         General transfer comment: Pt stood from elevated bed with Mod A +2 and trunk facilitation to achieve upright standing position. Pt able to maintain static standing balance with Min A +1. +2 required to pivot to [facility], increased support needed to manage RW and direct descent onto commode. Pt required step by step verbal cues to sequence movement.     Balance Overall balance assessment: Needs assistance Sitting-balance support: No upper extremity supported, Feet supported Sitting balance-Leahy Scale: Fair Sitting balance - Comments: sat EOB with supervision   Standing balance support: Bilateral upper extremity supported, During functional activity, Reliant on assistive device for balance Standing balance-Leahy Scale: Poor Standing balance comment: reliant on RW and external support to maintain standing balance.                           ADL either performed or assessed with clinical judgement   ADL Overall ADL's : Needs assistance/impaired     Grooming: Set up;Sitting               Lower Body Dressing: Minimal assistance Lower Body Dressing Details (indicate cue type and reason): don sock and don limb guard  with Min A. Assist to manage wound vac line and catheter lines. Toilet Transfer: Moderate assistance;+2 for safety/equipment;Cueing for safety;Stand-pivot;BSC/3in1;Rolling walker (2 wheels) Toilet Transfer Details (indicate cue type and reason): Pt required Mod A +2 to manage lines and DME. Toileting- Clothing  Manipulation and Hygiene: Maximal assistance;Sit to/from stand Toileting - Clothing Manipulation Details (indicate cue type and reason): Max A to complete posterior peri care       General ADL Comments: Limited d/t soft BP and dizziness    Extremity/Trunk Assessment Upper Extremity Assessment Upper Extremity Assessment: Generalized weakness            Vision       Perception     Praxis     Communication Communication Communication: No apparent difficulties   Cognition Arousal: Alert Behavior During Therapy: Anxious Cognition: No apparent impairments             OT - Cognition Comments: Pt with increased fear of falling. Tearful during session, able to console Pt regarding limb loss. Pt responsive to verbal encouragement and patience. Pt likes to be independent, will ask for help as needed.                 Following commands: Intact        Cueing   Cueing Techniques: Verbal cues, Gestural cues, Visual cues  Exercises      Shoulder Instructions       General Comments Pt tearful this session upon reflection of amputation. Pt re-educated on desnsitization strategies and encouraged to continue engagement. BP on initial supine to sit: 88/57; sit to stand: 86/62; following stand pivot transfer to [facility]: 100/68    Pertinent Vitals/ Pain       Pain Assessment Pain Assessment: PAINAD Breathing: occasional labored breathing, short period of hyperventilation Negative Vocalization: none Facial Expression: sad, frightened, frown Body Language: tense, distressed pacing, fidgeting Consolability: distracted or reassured by voice/touch PAINAD Score: 4 Pain Location: RLE Pain Descriptors / Indicators: Discomfort, Grimacing, Guarding, Crying, Other (Comment) (phantom limb pain) Pain Intervention(s): Limited activity within patient's tolerance, Monitored during session, Repositioned  Home Living                                          Prior  Functioning/Environment              Frequency  Min 2X/week        Progress Toward Goals  OT Goals(current goals can now be found in the care plan section)  Progress towards OT goals: Progressing toward goals  Acute Rehab OT Goals Patient Stated Goal: to get better OT Goal Formulation: With patient Time For Goal Achievement: 11/05/24 Potential to Achieve Goals: Good ADL Goals Pt Will Perform Lower Body Dressing: with min assist;sit to/from stand Pt Will Transfer to Toilet: with min assist;bedside commode;stand pivot transfer Pt Will Perform Toileting - Clothing Manipulation and hygiene: with min assist;sit to/from stand;sitting/lateral leans  Plan      Co-evaluation                 AM-PAC OT 6 Clicks Daily Activity     Outcome Measure   Help from another person eating meals?: None Help from another person taking care of personal grooming?: A Little Help from another person toileting, which includes using toliet, bedpan, or urinal?: Total Help from another person bathing (including washing, rinsing, drying)?: A Lot Help from  another person to put on and taking off regular upper body clothing?: A Little Help from another person to put on and taking off regular lower body clothing?: A Lot 6 Click Score: 15    End of Session Equipment Utilized During Treatment: Gait belt;Rolling walker (2 wheels)  OT Visit Diagnosis: Other abnormalities of gait and mobility (R26.89);Unsteadiness on feet (R26.81);Muscle weakness (generalized) (M62.81)   Activity Tolerance Patient limited by pain;Other (comment) (soft BP)   Patient Left in bed;with call bell/phone within reach;with nursing/sitter in room   Nurse Communication Mobility status;Other (comment) (RN present to hand off pt care)        Time: 8863-8777 OT Time Calculation (min): 46 min  Charges: OT General Charges $OT Visit: 1 Visit OT Treatments $Self Care/Home Management : 8-22 mins $Therapeutic Activity:  23-37 mins  Maurilio CROME, OTR/L.  [facility] Acute Rehabilitation  Office: [phone]   Maurilio PARAS Erskine Steinfeldt 10/28/2024, 2:23 PM

## 2024-10-29 DIAGNOSIS — B850 Pediculosis due to Pediculus humanus capitis: Secondary | ICD-10-CM | POA: Diagnosis not present

## 2024-10-29 LAB — CBC WITH DIFFERENTIAL/PLATELET
Abs Immature Granulocytes: 0.11 K/uL — ABNORMAL HIGH (ref 0.00–0.07)
Basophils Absolute: 0.1 K/uL (ref 0.0–0.1)
Basophils Relative: 1 %
Eosinophils Absolute: 0.4 K/uL (ref 0.0–0.5)
Eosinophils Relative: 3 %
HCT: 30.8 % — ABNORMAL LOW (ref 36.0–46.0)
Hemoglobin: 9.7 g/dL — ABNORMAL LOW (ref 12.0–15.0)
Immature Granulocytes: 1 %
Lymphocytes Relative: 42 %
Lymphs Abs: 4.2 K/uL — ABNORMAL HIGH (ref 0.7–4.0)
MCH: 25.3 pg — ABNORMAL LOW (ref 26.0–34.0)
MCHC: 31.5 g/dL (ref 30.0–36.0)
MCV: 80.2 fL (ref 80.0–100.0)
Monocytes Absolute: 0.8 K/uL (ref 0.1–1.0)
Monocytes Relative: 8 %
Neutro Abs: 4.6 K/uL (ref 1.7–7.7)
Neutrophils Relative %: 45 %
Platelets: 564 K/uL — ABNORMAL HIGH (ref 150–400)
RBC: 3.84 MIL/uL — ABNORMAL LOW (ref 3.87–5.11)
RDW: 16.5 % — ABNORMAL HIGH (ref 11.5–15.5)
WBC: 10.2 K/uL (ref 4.0–10.5)
nRBC: 0 % (ref 0.0–0.2)

## 2024-10-29 LAB — GLUCOSE, CAPILLARY
Glucose-Capillary: 215 mg/dL — ABNORMAL HIGH (ref 70–99)
Glucose-Capillary: 340 mg/dL — ABNORMAL HIGH (ref 70–99)

## 2024-10-29 MED ORDER — LOPERAMIDE HCL 2 MG PO CAPS
2.0000 mg | ORAL_CAPSULE | Freq: Four times a day (QID) | ORAL | Status: DC | PRN
Start: 2024-10-29 — End: 2024-11-01
  Administered 2024-10-29 – 2024-10-31 (×8): 2 mg via ORAL
  Filled 2024-10-29 (×9): qty 1

## 2024-10-29 MED ORDER — INSULIN ASPART 100 UNIT/ML IJ SOLN
3.0000 [IU] | Freq: Three times a day (TID) | INTRAMUSCULAR | Status: DC
  Administered 2024-10-30 – 2024-10-31 (×4): 3 [IU] via SUBCUTANEOUS
  Filled 2024-10-29 (×3): qty 3

## 2024-10-29 NOTE — Inpatient Diabetes Management (Signed)
 Inpatient Diabetes Program Recommendations  AACE/ADA: New Consensus Statement on Inpatient Glycemic Control (2015)  Target Ranges:  Prepandial:   less than 140 mg/dL      Peak postprandial:   less than 180 mg/dL (1-2 hours)      Critically ill patients:  140 - 180 mg/dL   Lab Results  Component Value Date   GLUCAP 215 (H) 10/29/2024   HGBA1C >15.5 (H) 10/19/2024    Review of Glycemic Control  Latest Reference Range & Units 10/28/24 07:32 10/28/24 11:28 10/28/24 16:20 10/28/24 21:18 10/29/24 06:40  Glucose-Capillary 70 - 99 mg/dL 770 (H) 765 (H) 713 (H) 185 (H) 215 (H)  (H): Data is abnormally high Diabetes history: DM Outpatient Diabetes medications: Tresiba 16 units every day, Humalog 3-5 units TID Current orders for Inpatient glycemic control:  Amaryl 4 mg Daily Semglee  20 units Daily Novolog  0-15 units TID & HS   Ensure plus high protein (19 grams of carbohydrate)   Inpatient Diabetes Program Recommendations:     Consider increasing Semglee  24 units at bedtime and Novolog  3 units TID (assuming patient is consuming >50% of meals).  Thanks, Tinnie Minus, MSN, RNC-OB Diabetes Coordinator [phone] (8a-5p)

## 2024-10-29 NOTE — Progress Notes (Signed)
 Physical Therapy Treatment Patient Details Name: Phyllis Pittman MRN: [id number] DOB: 27-Apr-1976 Today's Date: 10/29/2024   History of Present Illness Pt is a 48 y.o female admitted 10/31 for R foot wound. 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 PMH: HTN, sepsis, DM, HTN, HLD, OSA, CVA, fibromyalgia, neuropathy, sciatica, s/p L fifth toe amp, POTS    PT Comments  Pt resting in bed on arrival, agreeable to session, with pt demonstrating steady progress towards acute goals. With increased time pt able to come to sitting EOB without physical assist with pt demonstrating fair sitting balance with unilateral UE support. Pt performing transfers sit<>stand and squat pivot transfers with min A to maintain balance throughout and cues for technique and safety. Pt with flexed posture in standing throughout with pt unable to make corrective changes. Educated pt on importance of resting with RLE in extension with pt receptive to pillow placement at distal end of residual limb at end of session. Patient will benefit from continued inpatient follow up therapy, <3 hours/day to address deficits and maximize functional independence and decrease caregiver burden. Will continue to follow acutely.    If plan is discharge home, recommend the following: A little help with bathing/dressing/bathroom;Assistance with cooking/housework;Assist for transportation;A lot of help with walking and/or transfers;Help with stairs or ramp for entrance   Can travel by private vehicle     No  Equipment Recommendations  Other (comment) (TBA; currently would need hospital bed, hoyer, pressure relief cushion on wheelchair with support for R BKA)    Recommendations for Other Services       Precautions / Restrictions Precautions Precautions: Fall Recall of Precautions/Restrictions: Intact Precaution/Restrictions Comments: Watch BP Required Braces or Orthoses: Other  Brace Other Brace: limb guard right residual limb Restrictions Weight Bearing Restrictions Per Provider Order: Yes RLE Weight Bearing Per Provider Order: Non weight bearing     Mobility  Bed Mobility Overal bed mobility: Needs Assistance Bed Mobility: Supine to Sit     Supine to sit: Supervision, HOB elevated, Used rails     General bed mobility comments: supervision with increased time, cues to scoot out to EOB    Transfers Overall transfer level: Needs assistance Equipment used: Rolling walker (2 wheels) Transfers: Sit to/from Stand, Bed to chair/wheelchair/BSC Sit to Stand: Min assist     Squat pivot transfers: Min assist     General transfer comment: pt standing from slightly elevated EOB with light min A to steady on rise, pt with flexed posture in standing and unable to correct, pt performing squat pivot to L with min A to maintain balance with pt releasing support of RW to reach for recliner armrest on L. min A to guide hips to sitting surface    Ambulation/Gait               General Gait Details: unable   Stairs             Wheelchair Mobility     Tilt Bed    Modified Rankin (Stroke Patients Only)       Balance Overall balance assessment: Needs assistance Sitting-balance support: No upper extremity supported, Feet supported Sitting balance-Leahy Scale: Fair Sitting balance - Comments: sat EOB with supervision   Standing balance support: Bilateral upper extremity supported, During functional activity, Reliant on assistive device for balance Standing balance-Leahy Scale: Poor Standing balance comment: reliant on RW and external support to maintain standing balance.  Communication Communication Communication: No apparent difficulties  Cognition Arousal: Alert Behavior During Therapy: Anxious, WFL for tasks assessed/performed   PT - Cognitive impairments: Safety/Judgement, Problem solving,  Initiation                       PT - Cognition Comments: Slow processing, self-directed. Following commands: Intact      Cueing Cueing Techniques: Verbal cues, Gestural cues, Visual cues  Exercises General Exercises - Lower Extremity Ankle Circles/Pumps: AROM, Left, 5 reps, Seated Quad Sets: AROM, Both, 5 reps, Seated    General Comments General comments (skin integrity, edema, etc.): BP soft but stable, pt without c/o dizziness      Pertinent Vitals/Pain Pain Assessment Pain Assessment: Faces Faces Pain Scale: Hurts little more Pain Location: RLE Pain Descriptors / Indicators: Discomfort, Grimacing, Guarding, Other (Comment) (phantom limb pain) Pain Intervention(s): RN gave pain meds during session, Monitored during session, Limited activity within patient's tolerance    Home Living                          Prior Function            PT Goals (current goals can now be found in the care plan section) Acute Rehab PT Goals Patient Stated Goal: to go home PT Goal Formulation: With patient Time For Goal Achievement: 11/05/24 Progress towards PT goals: Progressing toward goals    Frequency    Min 2X/week      PT Plan      Co-evaluation              AM-PAC PT 6 Clicks Mobility   Outcome Measure  Help needed turning from your back to your side while in a flat bed without using bedrails?: None Help needed moving from lying on your back to sitting on the side of a flat bed without using bedrails?: A Little Help needed moving to and from a bed to a chair (including a wheelchair)?: A Lot Help needed standing up from a chair using your arms (e.g., wheelchair or bedside chair)?: A Lot Help needed to walk in hospital room?: Total Help needed climbing 3-5 steps with a railing? : Total 6 Click Score: 13    End of Session   Activity Tolerance: Patient tolerated treatment well Patient left: with call bell/phone within reach;in chair Nurse  Communication: Mobility status PT Visit Diagnosis: Muscle weakness (generalized) (M62.81)     Time: 8954-8885 PT Time Calculation (min) (ACUTE ONLY): 29 min  Charges:    $Therapeutic Activity: 23-37 mins PT General Charges $$ ACUTE PT VISIT: 1 Visit                     Therisa R. PTA Acute Rehabilitation Services Office: [phone]   Therisa CHRISTELLA Boor 10/29/2024, 11:21 AM

## 2024-10-29 NOTE — TOC Transition Note (Addendum)
 Transition of Care [facility]) - Discharge Note   Patient Details  Name: Phyllis Pittman MRN: [id number] Date of Birth: [birth year]  Transition of [facility] CM/SW Contact:  Bridget Cordella Simmonds, LCSW Phone Number: 10/29/2024, 10:53 AM   Clinical Narrative:   CSW spoke with Tammy/Linden, updated that still small number of nits in pt hair.  Per Tammy, if treatment today can remove all of them, pt can admit to [facility].  Today is last day of pt SNF auth.  RN updated and will work on it.      Barriers to Discharge: Continued Medical Work up   Patient Goals and CMS Choice            Discharge Placement                       Discharge Plan and Services Additional resources added to the After Visit Summary for                                       Social Drivers of Health (SDOH) Interventions SDOH Screenings   Food Insecurity: Patient Declined (10/18/2024)  Housing: Unknown (10/18/2024)  Transportation Needs: Unknown (10/18/2024)  Utilities: Patient Declined (10/18/2024)  Social Connections: Unknown (04/20/2022)   Received from [facility]  Tobacco Use: High Risk (10/21/2024)     Readmission Risk Interventions     No data to display

## 2024-10-29 NOTE — Progress Notes (Signed)
Pt refusing fingersticks - MD aware

## 2024-10-29 NOTE — Progress Notes (Signed)
 PROGRESS NOTE   Phyllis Pittman  FMW:[id number]    DOB: [birth year]    DOA: 10/16/2024  PCP: [name], PA   I have briefly reviewed patients previous medical records in [facility].   Brief Hospital Course:  48 year old female with medical history significant for type II DM with neuropathy, HTN, HLD, CVA, GERD, anxiety and depression, recently treated for left foot infection, partial fifth ray amputation, prolonged IV antibiotics via PICC line, seen at wound care center the week prior to this admission, found to have a new wound with fever at that time, right foot was swollen and erythematous up to the ankle with congested discolored small toe with multiple open wounds.  10/22, she was advised to go to the ED for further evaluation and I&D but patient did not go.  She return to wound care center on 10/29, her right foot was necrotic and she was advised to return to ED.  She again declined but returned 2 days later via EMS with foul-smelling drainage, pain and lethargic.  She was admitted due to sepsis secondary to right foot gangrene.  Vascular surgery and orthopedics consulted.  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  Currently medically optimized for DC to SNF pending completion of therapy for her Lice.   Assessment & Plan:   Sepsis, POA Secondary to diabetic right foot infection with gangrene History and details as noted above. Vascular surgery, orthopedics and ID consulted. 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 Harden on 11/5. As per Dr. Harden follow-up 11/8, wound VAC removed and dry dressing applied.  Currently has stump shrinker. Completed a course of IV Unasyn and linezolid on 11/6.  Blood cultures negative. Multimodality pain control.  Judicious use of opioids. Therapies recommended SNF with plans to DC  to SNF pending completion of treatment for lice  Lice infestation On permethrin topically daily x 3 days.  As per RN update 11/12, still had lice eggs in her hair.  As per ICM update 11/13, patient still had small number of nits in patients hair.  Facial dermatitis,?  Cellulitis Topical ketoconazole Outpatient dermatology consultation and follow-up Appears to have improved or even resolved.  POTS syndrome Intermittent soft blood pressures Continue midodrine 10 mg 3 times daily. Was supposed to have tilt table testing at [facility] be followed up outpatient  EPEC diarrhea Completed course of azithromycin.  Supportive care  IBS history Complicated by EPEC diarrhea as noted above.  Had Flexi-Seal which was removed 11/10. Continue bowel regimen Patient reports that at home she used to be on Imodium multiple times daily as needed up to a maximum of 8 mg/day.  Her PCP then cut it down to 1 tablet at bedtime.  We had stopped the Imodium yesterday since there was no BM recorded since 11/11.  Patient however today indicates that she had 3 BMs yesterday, 2 BMs overnight and 1 this morning, some of them were loose stools and is asking for as needed Imodium.  Reordered.  Discontinued Lomotil.  Urinary retention Attempt to clamp Foley catheter 11/10 was unsuccessful.  Plan was to discharge her with a Foley catheter.  However since she is still going to be here another day, plan was to attempt Foley clamping trial yesterday, communicated with RN but not sure if it was done.  Messaged RN again today.  Uncontrolled type II DM A1c >15.5 on 11/3 Currently on Semglee  16 units nightly, sensitive SSI without bedtime scale and Amaryl with suboptimal control.  CBGs mostly in the mid 200s and even up to 382, 418 last night Increase Semglee  to 20 units at bedtime, change SSI to moderate sensitivity with bedtime scale with improved control.  Added mealtime NovoLog  3 units 3 times daily.  Anxiety  and depression Continue Atarax   Anemia Stable  Pseudohyponatremia  Wound 10/16/24 1830 Pressure Injury Buttocks Upper Stage 2 -  Partial thickness loss of dermis presenting as a shallow open injury with a red, pink wound bed without slough. (Active)     Wound 10/16/24 1830 Pressure Injury Sacrum Medial;Lower Stage 2 -  Partial thickness loss of dermis presenting as a shallow open injury with a red, pink wound bed without slough. (Active)  Frequent change of positions, improve nutrition and mobilize.   Body mass index is 26.74 kg/m.   DVT prophylaxis: SCD's Start: 10/21/24 1336 enoxaparin  (LOVENOX ) injection 40 mg Start: 10/16/24 2000 SCDs Start: 10/16/24 1750 SCDs Start: 10/16/24 1749     Code Status: Full Code:  Family Communication: None at bedside Disposition:  Status is: Inpatient Remains inpatient appropriate because: Ongoing lice treatment   Consultants:   Vascular surgery Orthopedics Infectious disease  Procedures:   As above  Subjective:  Patient asking for Imodium as needed due to history as noted above.  Voices frustration due to multiple issues.  Unsatisfied with nursing care overnight.  Also states that this is the first time that she has had lice and does not seem to see why a big deal is being made out of it.  No other complaints reported.  Specifically no pain.  Objective:   Vitals:   10/28/24 2018 10/29/24 0310 10/29/24 0736 10/29/24 1059  BP: 115/78 (!) 86/56 110/76 93/74  Pulse: 79 78 80   Resp:   16   Temp: 98 F (36.7 C) 98.2 F (36.8 C) (!) 97.5 F (36.4 C)   TempSrc:   Oral   SpO2: 99% 98% 97% 100%  Weight:      Height:        General exam: Young female, small built, frail, chronically ill looking sitting up comfortably in bed without distress. Respiratory system: Clear to auscultation. Respiratory effort normal. Cardiovascular system: S1 & S2 heard, RRR. No JVD, murmurs, rubs, gallops or clicks. No pedal edema.  Telemetry personally  reviewed: Sinus rhythm.  Discontinue telemetry. Gastrointestinal system: Abdomen is nondistended, soft and nontender. No organomegaly or masses felt. Normal bowel sounds heard. Central nervous system: Alert and oriented. No focal neurological deficits. Extremities: Symmetric 5 x 5 power.  Left foot dressing clean and dry.  Right BKA stump with stump shrinker. Skin: No rashes, lesions or ulcers Psychiatry: Judgement and insight appear normal. Mood & affect appropriate.     Data Reviewed:   I have personally reviewed following labs and imaging studies   CBC: Recent Labs  Lab 10/27/24 0337 10/28/24 0910 10/29/24 0500  WBC 12.3* 11.3* 10.2  NEUTROABS 6.1 5.6 4.6  HGB 9.6* 10.2* 9.7*  HCT 30.0* 32.3* 30.8*  MCV 78.7* 79.2* 80.2  PLT 564* 555* 564*    Basic Metabolic Panel: Recent Labs  Lab 10/23/24 0258 10/24/24 0344 10/27/24 0337 10/28/24 0508  NA 131* 131* 130* 132*  K 4.1 4.1 4.4 5.0  CL 95* 94* 92* 94*  CO2 28 28 27 26   GLUCOSE 69* 163* 275* 221*  BUN <5* <5* 9  13  CREATININE 0.43* 0.51 0.41* 0.57  CALCIUM 7.3* 7.8* 8.4* 8.6*  MG 2.1  --   --   --     Liver Function Tests: No results for input(s): AST, ALT, ALKPHOS, BILITOT, PROT, ALBUMIN in the last 168 hours.  CBG: Recent Labs  Lab 10/28/24 1620 10/28/24 2118 10/29/24 0640  GLUCAP 286* 185* 215*    Microbiology Studies:   Recent Results (from the past 240 hours)  Culture, blood (Routine X 2) w Reflex to ID Panel     Status: None   Collection Time: 10/20/24  7:32 PM   Specimen: BLOOD  Result Value Ref Range Status   Specimen Description BLOOD SITE NOT SPECIFIED  Final   Special Requests   Final    BOTTLES DRAWN AEROBIC AND ANAEROBIC Blood Culture adequate volume   Culture   Final    NO GROWTH 5 DAYS Performed at [facility], [address], [place], [place] [address]    Report Status 10/25/2024 FINAL  Final  Culture, blood (Routine X 2) w Reflex to ID Panel     Status: None    Collection Time: 10/20/24  7:35 PM   Specimen: BLOOD  Result Value Ref Range Status   Specimen Description BLOOD SITE NOT SPECIFIED  Final   Special Requests   Final    BOTTLES DRAWN AEROBIC AND ANAEROBIC Blood Culture adequate volume   Culture   Final    NO GROWTH 5 DAYS Performed at [facility], [address] [place], [place] [address]    Report Status 10/25/2024 FINAL  Final    Radiology Studies:  No results found.  Scheduled Meds:    acetaminophen   1,000 mg Oral Q6H   Chlorhexidine Gluconate Cloth  6 each Topical Daily   collagenase   Topical Daily   enoxaparin  (LOVENOX ) injection  40 mg Subcutaneous Q24H   feeding supplement  237 mL Oral BID BM   ferrous sulfate  325 mg Oral QODAY   gabapentin  800 mg Oral TID   glimepiride  4 mg Oral Q breakfast   hydrOXYzine   50 mg Oral BID   insulin  aspart  0-15 Units Subcutaneous TID WC   insulin  aspart  0-5 Units Subcutaneous QHS   insulin  glargine-yfgn  20 Units Subcutaneous QHS   ketoconazole  1 Application Topical BID   midodrine  10 mg Oral TID WC   nystatin  5 mL Oral QID   pantoprazole   40 mg Oral Daily   permethrin   Topical Q0600    Continuous Infusions:     LOS: 13 days     Trenda Mar, MD,  FACP, [facility], [facility], [facility]   [facility] Ad[facility] [place]      To contact the attending provider between 7A-7P or the covering provider during after hours 7P-7A, please log into the web site [email or web address] and access using universal [place] password for that web site. If you do not have the password, please call the hospital operator.  10/29/2024, 1:45 PM

## 2024-10-29 NOTE — Plan of Care (Signed)
  Problem: Education: Goal: Knowledge of General Education information will improve Description: Including pain rating scale, medication(s)/side effects and non-pharmacologic comfort measures Outcome: Progressing   Problem: Clinical Measurements: Goal: Respiratory complications will improve Outcome: Progressing   Problem: Health Behavior/Discharge Planning: Goal: Ability to manage health-related needs will improve Outcome: Not Progressing   Problem: Skin Integrity: Goal: Risk for impaired skin integrity will decrease Outcome: Not Progressing   Problem: Skin Integrity: Goal: Skin integrity will improve Outcome: Not Progressing   Problem: Pain Managment: Goal: General experience of comfort will improve and/or be controlled Outcome: Not Progressing

## 2024-10-29 NOTE — Progress Notes (Signed)
 Catheter clamped for 2 hours, patient felt no sensation to urinate. Unclamped at 1830

## 2024-10-30 DIAGNOSIS — M869 Osteomyelitis, unspecified: Secondary | ICD-10-CM | POA: Diagnosis not present

## 2024-10-30 LAB — GLUCOSE, CAPILLARY
Glucose-Capillary: 201 mg/dL — ABNORMAL HIGH (ref 70–99)
Glucose-Capillary: 244 mg/dL — ABNORMAL HIGH (ref 70–99)
Glucose-Capillary: 112 mg/dL — ABNORMAL HIGH (ref 70–99)
Glucose-Capillary: 285 mg/dL — ABNORMAL HIGH (ref 70–99)
Glucose-Capillary: 152 mg/dL — ABNORMAL HIGH (ref 70–99)

## 2024-10-30 MED ORDER — PYRETHRINS-PIPERONYL BUTOXIDE 0.33-4 % EX SHAM
MEDICATED_SHAMPOO | Freq: Once | CUTANEOUS | Status: AC
  Filled 2024-10-30: qty 118

## 2024-10-30 MED ORDER — INSULIN GLARGINE-YFGN 100 UNIT/ML ~~LOC~~ SOLN
22.0000 [IU] | Freq: Every day | SUBCUTANEOUS | Status: DC
  Administered 2024-10-30: 22 [IU] via SUBCUTANEOUS
  Filled 2024-10-30 (×2): qty 0.22

## 2024-10-30 MED ORDER — BACITRACIN-NEOMYCIN-POLYMYXIN OINTMENT TUBE
TOPICAL_OINTMENT | Freq: Three times a day (TID) | CUTANEOUS | Status: DC
  Filled 2024-10-30: qty 14

## 2024-10-30 NOTE — Plan of Care (Signed)
   Problem: Coping: Goal: Ability to adjust to condition or change in health will improve Outcome: Progressing   Problem: Health Behavior/Discharge Planning: Goal: Ability to identify and utilize available resources and services will improve Outcome: Progressing   Problem: Health Behavior/Discharge Planning: Goal: Ability to manage health-related needs will improve Outcome: Progressing   Problem: Metabolic: Goal: Ability to maintain appropriate glucose levels will improve Outcome: Progressing

## 2024-10-30 NOTE — Progress Notes (Addendum)
 Pt reported urge to urinate/pressure around 0200, I verified amt in balloon (8ml), notified provider who instructed me to flush w 30-40 cc, about an hour after flush 750 ml of yellow/straw colored, clear urine was emptied from drainage bag.  Attempted to provide dressing change but pt refused at this time.  Pt refused short acting insulin , agreed to the long acting 20units at HS

## 2024-10-30 NOTE — Progress Notes (Signed)
 PROGRESS NOTE   Phyllis Pittman  FMW:[id number]    DOB: Jul 13, 1976    DOA: 10/16/2024  PCP: [name], PA   I have briefly reviewed patients previous medical records in [facility].   Brief Hospital Course:  48 year old female with medical history significant for type II DM with neuropathy, HTN, HLD, CVA, GERD, anxiety and depression, recently treated for left foot infection, partial fifth ray amputation, prolonged IV antibiotics via PICC line, seen at wound care center the week prior to this admission, found to have a new wound with fever at that time, right foot was swollen and erythematous up to the ankle with congested discolored small toe with multiple open wounds.  10/22, she was advised to go to the ED for further evaluation and I&D but patient did not go.  She return to wound care center on 10/29, her right foot was necrotic and she was advised to return to ED.  She again declined but returned 2 days later via EMS with foul-smelling drainage, pain and lethargic.  She was admitted due to sepsis secondary to right foot gangrene.  Vascular surgery and orthopedics consulted.  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  Currently medically optimized for DC to SNF pending completion of therapy for her Lice.   Assessment & Plan:   Sepsis, POA Secondary to diabetic right foot infection with gangrene History and details as noted above. Vascular surgery, orthopedics and ID consulted. 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 Harden on 11/5. As per Dr. Harden follow-up 11/8, wound VAC removed and dry dressing applied.  Currently has stump shrinker. Completed a course of IV Unasyn and linezolid on 11/6.  Blood cultures negative. Multimodality pain control.  Judicious use of opioids. Therapies recommended SNF with plans to DC  to SNF pending completion of treatment for lice 11/14: Patient still has wound VAC on RLE and she states that the plastic protector sleeve on top is loose.  Discussed with Dr. Harden, plans to DC wound VAC and recommends dry dressing changes to right BKA and Saima to the left foot as well.  Lice infestation On permethrin topically daily x 3 days. As per ICM update 11/13, patient still had small number of nits in patients hair.  Awaiting update from nursing today.  Facial dermatitis,?  Cellulitis Topical ketoconazole Outpatient dermatology consultation and follow-up Appears to have improved or even resolved.  POTS syndrome Intermittent soft blood pressures Continue midodrine 10 mg 3 times daily. Was supposed to have tilt table testing at [facility] be followed up outpatient  EPEC diarrhea Completed course of azithromycin.  Supportive care  IBS history Complicated by EPEC diarrhea as noted above.  Had Flexi-Seal which was removed 11/10. Continue bowel regimen Patient reports that at home she used to be on Imodium multiple times daily as needed up to a maximum of 8 mg/day.  Her PCP then cut it down to 1 tablet at bedtime.  Per patient report, her IBS is complicated by alternating constipation and diarrhea.  Continue as needed Imodium.  Urinary retention Attempt to clamp Foley catheter 11/10 was unsuccessful.  It appears that patient failed a clamping trial again on 11/13.  Patient to discharge to SNF with Foley catheter and outpatient follow-up with urology in 10 to 14 days for further evaluation and voiding trial.  Patient needs  to mobilize.  Uncontrolled type II DM A1c >15.5 on 11/3 Currently on Semglee  16 units nightly, sensitive SSI without bedtime scale and Amaryl with suboptimal control.  CBGs mostly in the mid 200s and even up to 382, 418 last night FBS 244 this morning.  Will increase Semglee  from 20 to 22 units at bedtime, continue current mealtime NovoLog  3 units 3  times daily and NovoLog  SSI.  Monitor CBGs and adjust insulins as needed.  Anxiety and depression Continue Atarax   Anemia Stable  Pseudohyponatremia  Wound 10/16/24 1830 Pressure Injury Buttocks Upper Stage 2 -  Partial thickness loss of dermis presenting as a shallow open injury with a red, pink wound bed without slough. (Active)     Wound 10/16/24 1830 Pressure Injury Sacrum Medial;Lower Stage 2 -  Partial thickness loss of dermis presenting as a shallow open injury with a red, pink wound bed without slough. (Active)  Frequent change of positions, improve nutrition and mobilize.   Body mass index is 26.74 kg/m.   DVT prophylaxis: SCD's Start: 10/21/24 1336 enoxaparin  (LOVENOX ) injection 40 mg Start: 10/16/24 2000 SCDs Start: 10/16/24 1750 SCDs Start: 10/16/24 1749     Code Status: Full Code:  Family Communication: None at bedside Disposition:  Status is: Inpatient Remains inpatient appropriate because: Ongoing lice treatment.  Discussed with patient's RN and TOC this morning.  TOC is requesting a new SNF insurance authorization.  Can DC to SNF pending completion of lice eradication and SNF authorization.   Consultants:   Vascular surgery Orthopedics Infectious disease  Procedures:   As above  Subjective:  Sore RLE surgical site.  Indicates that the plastic sleeve protector is loose and falls off and is also asking if she will remain on the wound VAC.  Advised her that I would be reaching out to Dr. Harden today.  States that she had multiple BMs yesterday requiring Imodium and then improved overnight.  No abdominal pain.  Objective:   Vitals:   10/29/24 1939 10/29/24 2325 10/30/24 0627 10/30/24 0812  BP: 116/78 112/72 114/69 113/77  Pulse: 87  83 82  Resp: 17  17 16   Temp: 98.5 F (36.9 C)  98.3 F (36.8 C) (!) 97.5 F (36.4 C)  TempSrc:      SpO2: 99%  98% 97%  Weight:      Height:        General exam: Young female, small built, frail, chronically ill  looking sitting up comfortably in bed without distress. Respiratory system: Clear to auscultation. Respiratory effort normal. Cardiovascular system: S1 & S2 heard, RRR. No JVD, murmurs, rubs, gallops or clicks. No pedal edema.  Stable. Gastrointestinal system: Abdomen is nondistended, soft and nontender. No organomegaly or masses felt. Normal bowel sounds heard. Central nervous system: Alert and oriented. No focal neurological deficits. Extremities: Symmetric 5 x 5 power.  Left foot dressing clean and dry.  Right BKA stump with stump shrinker.  Has wound VAC in place. Skin: No rashes, lesions or ulcers Psychiatry: Judgement and insight appear normal. Mood & affect appropriate.     Data Reviewed:   I have personally reviewed following labs and imaging studies   CBC: Recent Labs  Lab 10/27/24 0337 10/28/24 0910 10/29/24 0500  WBC 12.3* 11.3* 10.2  NEUTROABS 6.1 5.6 4.6  HGB 9.6* 10.2* 9.7*  HCT 30.0* 32.3* 30.8*  MCV 78.7* 79.2* 80.2  PLT 564* 555* 564*    Basic Metabolic Panel: Recent Labs  Lab 10/24/24 0344 10/27/24 0337 10/28/24 0508  NA  131* 130* 132*  K 4.1 4.4 5.0  CL 94* 92* 94*  CO2 28 27 26   GLUCOSE 163* 275* 221*  BUN <5* 9 13  CREATININE 0.51 0.41* 0.57  CALCIUM 7.8* 8.4* 8.6*    Liver Function Tests: No results for input(s): AST, ALT, ALKPHOS, BILITOT, PROT, ALBUMIN in the last 168 hours.  CBG: Recent Labs  Lab 10/30/24 0627 10/30/24 0847 10/30/24 1117  GLUCAP 201* 244* 112*    Microbiology Studies:   Recent Results (from the past 240 hours)  Culture, blood (Routine X 2) w Reflex to ID Panel     Status: None   Collection Time: 10/20/24  7:32 PM   Specimen: BLOOD  Result Value Ref Range Status   Specimen Description BLOOD SITE NOT SPECIFIED  Final   Special Requests   Final    BOTTLES DRAWN AEROBIC AND ANAEROBIC Blood Culture adequate volume   Culture   Final    NO GROWTH 5 DAYS Performed at [facility], [address], [place], [place] [address]    Report Status 10/25/2024 FINAL  Final  Culture, blood (Routine X 2) w Reflex to ID Panel     Status: None   Collection Time: 10/20/24  7:35 PM   Specimen: BLOOD  Result Value Ref Range Status   Specimen Description BLOOD SITE NOT SPECIFIED  Final   Special Requests   Final    BOTTLES DRAWN AEROBIC AND ANAEROBIC Blood Culture adequate volume   Culture   Final    NO GROWTH 5 DAYS Performed at [facility], [address], [place], [place] [address]    Report Status 10/25/2024 FINAL  Final    Radiology Studies:  No results found.  Scheduled Meds:    acetaminophen   1,000 mg Oral Q6H   Chlorhexidine Gluconate Cloth  6 each Topical Daily   collagenase   Topical Daily   enoxaparin  (LOVENOX ) injection  40 mg Subcutaneous Q24H   feeding supplement  237 mL Oral BID BM   ferrous sulfate  325 mg Oral QODAY   gabapentin  800 mg Oral TID   glimepiride  4 mg Oral Q breakfast   hydrOXYzine   50 mg Oral BID   insulin  aspart  0-15 Units Subcutaneous TID WC   insulin  aspart  0-5 Units Subcutaneous QHS   insulin  aspart  3 Units Subcutaneous TID WC   insulin  glargine-yfgn  20 Units Subcutaneous QHS   ketoconazole  1 Application Topical BID   midodrine  10 mg Oral TID WC   nystatin  5 mL Oral QID   pantoprazole   40 mg Oral Daily   permethrin   Topical Q0600    Continuous Infusions:     LOS: 14 days     Trenda Mar, MD,  FACP, [facility], [facility], [facility]   [facility] [place]      To contact the attending provider between 7A-7P or the covering provider during after hours 7P-7A, please log into the web site [email or web address] and access using universal [place] password for that web site. If you do not have the password, please call the hospital operator.  10/30/2024, 12:30 PM

## 2024-10-30 NOTE — Plan of Care (Signed)

## 2024-10-30 NOTE — TOC Progression Note (Addendum)
 Transition of Care [facility]) - Progression Note    Patient Details  Name: Phyllis Pittman MRN: [id number] Date of Birth: [birth year]  [facility] of [facility]) CM/SW Contact  Bridget Cordella Simmonds, LCSW Phone Number: 10/30/2024, 8:42 AM  Clinical Narrative:   SNF auth request submitted in [place].   1245: Auth request remains pending.  Lice problem is not yet resolved.   [facility] updated.  They can receive pt over the weekend if ready.  Wanda is weekend contact: [phone].     Barriers to Discharge: Continued Medical Work up               Expected Discharge Plan and Services                                               Social Drivers of Health (SDOH) Interventions SDOH Screenings   Food Insecurity: Patient Declined (10/18/2024)  Housing: Unknown (10/18/2024)  Transportation Needs: Unknown (10/18/2024)  Utilities: Patient Declined (10/18/2024)  Social Connections: Unknown (04/20/2022)   Received from [facility]  Tobacco Use: High Risk (10/21/2024)    Readmission Risk Interventions     No data to display

## 2024-10-31 DIAGNOSIS — M869 Osteomyelitis, unspecified: Secondary | ICD-10-CM | POA: Diagnosis not present

## 2024-10-31 DIAGNOSIS — B852 Pediculosis, unspecified: Secondary | ICD-10-CM | POA: Diagnosis not present

## 2024-10-31 LAB — GLUCOSE, CAPILLARY
Glucose-Capillary: 177 mg/dL — ABNORMAL HIGH (ref 70–99)
Glucose-Capillary: 244 mg/dL — ABNORMAL HIGH (ref 70–99)
Glucose-Capillary: 240 mg/dL — ABNORMAL HIGH (ref 70–99)

## 2024-10-31 MED ORDER — MIDODRINE HCL 10 MG PO TABS
10.0000 mg | ORAL_TABLET | Freq: Three times a day (TID) | ORAL | Status: AC
Start: 2024-10-31 — End: ?

## 2024-10-31 MED ORDER — GLIMEPIRIDE 4 MG PO TABS
4.0000 mg | ORAL_TABLET | Freq: Every day | ORAL | Status: AC
Start: 2024-11-01 — End: ?

## 2024-10-31 MED ORDER — COLLAGENASE 250 UNIT/GM EX OINT: TOPICAL_OINTMENT | Freq: Every day | CUTANEOUS | Status: AC

## 2024-10-31 MED ORDER — INSULIN ASPART 100 UNIT/ML IJ SOLN: 3.0000 [IU] | Freq: Three times a day (TID) | INTRAMUSCULAR | Status: AC

## 2024-10-31 MED ORDER — ENSURE PLUS HIGH PROTEIN PO LIQD: 237.0000 mL | Freq: Two times a day (BID) | ORAL | Status: AC

## 2024-10-31 MED ORDER — HYDROMORPHONE HCL 4 MG PO TABS: 4.0000 mg | ORAL_TABLET | Freq: Four times a day (QID) | ORAL | 0 refills | Status: AC | PRN

## 2024-10-31 MED ORDER — INSULIN ASPART 100 UNIT/ML IJ SOLN: 0.0000 [IU] | Freq: Three times a day (TID) | INTRAMUSCULAR | Status: AC

## 2024-10-31 MED ORDER — ACETAMINOPHEN 500 MG PO TABS: 1000.0000 mg | ORAL_TABLET | Freq: Four times a day (QID) | ORAL | Status: AC | PRN

## 2024-10-31 MED ORDER — BACITRACIN-NEOMYCIN-POLYMYXIN OINTMENT TUBE: 1.0000 | TOPICAL_OINTMENT | Freq: Three times a day (TID) | CUTANEOUS | Status: AC

## 2024-10-31 MED ORDER — HYDROMORPHONE HCL 1 MG/ML IJ SOLN
0.5000 mg | INTRAMUSCULAR | Status: DC | PRN
  Administered 2024-10-31: 1 mg via INTRAVENOUS
  Filled 2024-10-31: qty 1

## 2024-10-31 MED ORDER — LOPERAMIDE HCL 2 MG PO CAPS: 2.0000 mg | ORAL_CAPSULE | Freq: Four times a day (QID) | ORAL | Status: AC | PRN

## 2024-10-31 MED ORDER — INSULIN GLARGINE-YFGN 100 UNIT/ML ~~LOC~~ SOLN: 22.0000 [IU] | Freq: Every day | SUBCUTANEOUS | Status: AC

## 2024-10-31 NOTE — Progress Notes (Signed)
 1330-patient's head examined by this RN and Artist, Consulting Civil Engineer. No lice or eggs appreciated by either nurse. Dr. Judeth made aware.

## 2024-10-31 NOTE — Progress Notes (Signed)
 1400- Patient's wound vac removed. Dry dressing and ace wrap placed on stump.

## 2024-10-31 NOTE — Discharge Instructions (Signed)

## 2024-10-31 NOTE — Progress Notes (Signed)
 [facility] is here to transport pt. She is in no acute distress. Attempted to call [facility] to give report but there was no answer. Report was given to Mer[place], EMT.  Pt is discharged with her foley and checked her belongings prior to leaving to make sure she had everything.

## 2024-10-31 NOTE — Discharge Summary (Signed)
 Physician Discharge Summary  Phyllis Pittman FMW:[id number] DOB: Mar 06, 1976  PCP: Phyllis Rosina SAILOR, PA  Admitted from: Home Discharged to: SNF  Admit date: 10/16/2024 Discharge date: 10/31/2024  Recommendations for Outpatient Follow-up:    Follow-up Information     Phyllis Jerona GAILS, MD Follow up in 1 week(s).   Specialty: Orthopedic Surgery Contact information: [address] [place] [address] [phone]         MD at SNF Follow up.   Why: To be seen in 2 to 3 days with repeat labs (CBC & BMP).  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        Phyllis Pittman, [place]. Schedule an appointment as soon as possible for a visit.   Specialty: Physician Assistant Why: To be seen upon discharge from SNF. Contact information: [address] [place] [address] [phone]         [facility]. Schedule an appointment as soon as possible for a visit.   Contact information: [address] [place] [address] [place]  [address] [phone]                 Home Health: None    Equipment/Devices: Patient discharging to SNF with indwelling Foley catheter until outpatient evaluation by urology.    Discharge Condition: Improved and stable.   Code Status: Full Code Diet recommendation:  Discharge Diet Orders (From admission, onward)     Start     Ordered   10/31/24 0000  Diet - low sodium heart healthy        10/31/24 1529   10/31/24 0000  Diet Carb Modified        10/31/24 1529             Discharge Diagnoses:  Principal Problem:   Osteomyelitis (HCC) Active Problems:   Acute osteomyelitis of phalanx of right foot (HCC)   Cutaneous abscess of right foot   Gangrene of right foot (HCC)   Facial dermatitis   Diarrhea of infectious origin   Brief Hospital Course:  48 year old female  with medical history significant for type II DM with neuropathy, HTN, HLD, CVA, GERD, anxiety and depression, recently treated for left foot infection, partial fifth ray amputation, prolonged IV antibiotics via PICC line, seen at wound care center the week prior to this admission, found to have a new wound with fever at that time, right foot was swollen and erythematous up to the ankle with congested discolored small toe with multiple open wounds.  10/22, she was advised to go to the ED for further evaluation and I&D but patient did not go.  She returned to wound care center on 10/29, her right foot was necrotic and she was advised to return to ED.  She again declined but returned 2 days later via EMS with foul-smelling drainage, pain and lethargic.  She was admitted due to sepsis secondary to right foot gangrene.  Vascular surgery and orthopedics consulted.  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  Wound VAC was removed by orthopedics on day of discharge and they recommend outpatient follow-up in 1 week.  Patient was ready for discharge to SNF for the last couple of days but had head lice infestation which had to be treated prior to DC.     Assessment & Plan:    Sepsis, POA Secondary to diabetic right foot infection with gangrene History and details  as noted above. Vascular surgery, orthopedics and ID consulted. 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 Harden on 11/5. Completed a course of IV Unasyn and linezolid on 11/6.  Blood cultures negative. Multimodality pain control at discharge including as needed Tylenol  1 g every 6 hours, as needed p.o. Dilaudid  for moderate to severe pain and wean off as soon as able, gabapentin which is her PTA med.  Judicious use of opioids.  As per review of PDMP on day of discharge, she has had opioids and  gabapentin as prescribed by multiple providers in [facility]  Chronic pain issues.  However has not had alprazolam  filled since February 2025. Discussed in detail with Dr. Harden, orthopedics.  He evaluated patient on day of discharge.  He mended removing the wound VAC, dry dressing changes daily and discontinued the ill fitting limb protector.  He plans to follow-up in office in 1 week. On day of discharge, remove dressing and examined left partial fifth ray amputation site which has healed without any open wound and no acute findings.   Lice infestation S/p topical permethrin x 2 doses on 11/11 and 11/12 followed by pyrethrin shampoo on 11/14. Patient quite concerned about overall treatment.  She says she never had lice before and this is her first time.  She expressed extreme concern that she has lost a lot of her scalp hair which she feels is due to the chemicals in the treatment.  It was explained to her that some of this could be due to the lice infestation but could also be due to other dermatological causes.  Unfortunately we do not have a dermatologist in house for assessment of her alopecia and this will have to be done as outpatient.  She has flaky dry skin of her face and scalp, she may have some underlying dermatitis and would benefit from dermatology consultation.  As per RN report, they did not see any lice or nits on today's evaluation.   Facial dermatitis,?  Cellulitis Topical ketoconazole Outpatient dermatology consultation and follow-up-see discussion above. Also on Neosporin ointment for open wound on the left ear. Facial appears to have improved or even resolved.   POTS syndrome Intermittent soft blood pressures Continue midodrine 10 mg 3 times daily.  This can be dose adjusted or weaned off as tolerated. Was supposed to have tilt table testing at [facility] be followed up outpatient   EPEC diarrhea Completed course of azithromycin.  Supportive care   IBS  history Complicated by EPEC diarrhea as noted above.  Had Flexi-Seal which was removed 11/10. Continue bowel regimen Patient reports that at home she used to be on Imodium multiple times daily as needed up to a maximum of 8 mg/day.  Her PCP then cut it down to 1 tablet at bedtime.  Per patient report, her IBS is complicated by alternating constipation and diarrhea.  Continue as needed Imodium.   Urinary retention Attempt to clamp Foley catheter 11/10 was unsuccessful.  It appears that patient failed a clamping trial again on 11/13.  Patient to discharge to SNF with Foley catheter and outpatient follow-up with urology in 10 to 14 days for further evaluation and voiding trial.  Patient needs to mobilize.   Uncontrolled type II DM A1c >15.5 on 11/3 CBGs better controlled after adjustment of insulin : Continue Amaryl 4 mg daily, Semglee  22 units  at bedtime, mealtime NovoLog  3 units 3 times daily and NovoLog  SSI.  Closely monitor CBGs at SNF and adjust regimen as needed.   Anxiety and depression Continue Atarax  50 mg twice daily.  Dose she was getting at bedtime as needed Xanax  here, there was no record of her getting prescribed this on PDMP.  Discontinued Xanax  at DC.   Anemia Stable.  Periodically follow CBCs as outpatient.   Pseudohyponatremia   Wound 10/16/24 1830 Pressure Injury Buttocks Upper Stage 2 -  Partial thickness loss of dermis presenting as a shallow open injury with a red, pink wound bed without slough. (Active)     Wound 10/16/24 1830 Pressure Injury Sacrum Medial;Lower Stage 2 -  Partial thickness loss of dermis presenting as a shallow open injury with a red, pink wound bed without slough. (Active)  Frequent change of positions, improve nutrition and mobilize.     Body mass index is 26.74 kg/m.       Consultants:   Vascular surgery Orthopedics Infectious disease   Procedures:   As above   Discharge Instructions  Discharge Instructions     Call MD for:   difficulty breathing, headache or visual disturbances   Complete by: As directed    Call MD for:  extreme fatigue   Complete by: As directed    Call MD for:  persistant dizziness or light-headedness   Complete by: As directed    Call MD for:  persistant nausea and vomiting   Complete by: As directed    Call MD for:  redness, tenderness, or signs of infection (pain, swelling, redness, odor or green/yellow discharge around incision site)   Complete by: As directed    Call MD for:  severe uncontrolled pain   Complete by: As directed    Call MD for:  temperature >100.4   Complete by: As directed    Diet - low sodium heart healthy   Complete by: As directed    Diet Carb Modified   Complete by: As directed    Discharge instructions   Complete by: As directed    Patient will discharge to SNF with indwelling Foley catheter.  MD at SNF to arrange consultation with outpatient urology in 10 to 14 days to assess for urinary retention, possible voiding trial and discontinuation of Foley catheter.   Discharge wound care:   Complete by: As directed    Daily wound care: Cleanse sacral wound with Vashe wound cleanser, do not rinse and allow to air dry.  Apply 1/4 thick layer of Santyl to wound bed, top with saline moist gauze, dry gauze and silicone foam or ABD pad whichever is preferred.   Discharge wound care:   Complete by: As directed    Dry dressing changes daily and as needed to right BKA stump and left foot first ray amputation site-wound has actually healed.  Dressing changes as needed as well.  Use stump shrinker on right BKA.   Increase activity slowly   Complete by: As directed         Medication List     STOP taking these medications    ALPRAZolam  0.5 MG tablet Commonly known as: XANAX    Basaglar  KwikPen 100 UNIT/ML   cetirizine 10 MG tablet Commonly known as: ZYRTEC   cyclobenzaprine  5 MG tablet Commonly known as: FLEXERIL    insulin  lispro 100 UNIT/ML injection Commonly  known as: HUMALOG   Tresiba 100 UNIT/ML Soln Generic drug: Insulin  Degludec       TAKE these  medications    acetaminophen  500 MG tablet Commonly known as: TYLENOL  Take 2 tablets (1,000 mg total) by mouth every 6 (six) hours as needed for mild pain (pain score 1-3), fever or headache.   collagenase 250 UNIT/GM ointment Commonly known as: SANTYL Apply topically daily. Apply to sacral wound daily; Apply 1/4 thick layer of Santyl to wound bed, top with saline moist gauze (see wound care order) Start taking on: November 01, 2024   feeding supplement Liqd Take 237 mLs by mouth 2 (two) times daily between meals. Start taking on: November 01, 2024   gabapentin 800 MG tablet Commonly known as: NEURONTIN Take 800 mg by mouth in the morning, at noon, and at bedtime.   glimepiride 4 MG tablet Commonly known as: AMARYL Take 1 tablet (4 mg total) by mouth daily with breakfast. Start taking on: November 01, 2024   HYDROmorphone  4 MG tablet Commonly known as: DILAUDID  Take 1 tablet (4 mg total) by mouth every 6 (six) hours as needed for severe pain (pain score 7-10) or moderate pain (pain score 4-6).   hydrOXYzine  50 MG tablet Commonly known as: ATARAX  Take 50 mg by mouth in the morning and at bedtime.   insulin  aspart 100 UNIT/ML injection Commonly known as: novoLOG  Inject 0-15 Units into the skin 3 (three) times daily with meals. CBG < 70: Implement Hypoglycemia protocol, CBG 70 - 120: 0 units CBG 121 - 150: 2 units CBG 151 - 200: 3 units CBG 201 - 250: 5 units CBG 251 - 300: 8 units CBG 301 - 350: 11 units CBG 351 - 400: 15 units CBG > 400: call MD.   insulin  aspart 100 UNIT/ML injection Commonly known as: novoLOG  Inject 3 Units into the skin 3 (three) times daily with meals. Hold if patient does not consume more than 50% of her meal.   insulin  glargine-yfgn 100 UNIT/ML injection Commonly known as: SEMGLEE  Inject 0.22 mLs (22 Units total) into the skin at bedtime.    ketoconazole 2 % cream Commonly known as: NIZORAL Apply 1 Application topically See admin instructions. Apply to affected area 2 times a day   loperamide 2 MG capsule Commonly known as: IMODIUM Take 1 capsule (2 mg total) by mouth 4 (four) times daily as needed for diarrhea or loose stools.   midodrine 10 MG tablet Commonly known as: PROAMATINE Take 1 tablet (10 mg total) by mouth 3 (three) times daily with meals.   neomycin-bacitracin-polymyxin Oint Commonly known as: NEOSPORIN Apply 1 Application topically 3 (three) times daily for 7 days. Apply over open wound on left ear.   tiZANidine 4 MG tablet Commonly known as: ZANAFLEX Take 4 mg by mouth at bedtime.       Allergies  Allergen Reactions   Acyclovir Itching and Nausea Only   Dust Mite Extract Anaphylaxis, Hives, Shortness Of Breath, Itching, Swelling, Anxiety, Dermatitis and Rash   Molds & Smuts Anaphylaxis, Hives, Shortness Of Breath, Itching, Nausea Only, Swelling, Anxiety and Rash   Other Hives and Other (See Comments)    Dust, mold, powder   Prednisone Other (See Comments)    Hyperglycemia    Venlafaxine Shortness Of Breath and Other (See Comments)    Chest discomfort, too   Cephalexin  Nausea Only and Other (See Comments)    Tolerates via IV - not pills   Dulaglutide Nausea And Vomiting and Other (See Comments)    TRULICITY   Latex Dermatitis, Rash and Other (See Comments)    Powder only  Levofloxacin Nausea And Vomiting and Other (See Comments)    Can receive only via IV   Metformin Nausea And Vomiting   Semaglutide Other (See Comments)    Made her really sick (Ozempic or Wegovy)   Sulfa Antibiotics Nausea Only and Other (See Comments)    Stomach upset   Codeine Other (See Comments)    Unknown reaction   Famotidine Other (See Comments)    Reaction not cited      Procedures/Studies: MR FOOT RIGHT WO CONTRAST Result Date: 10/18/2024 EXAM: MRI of the right Foot without contrast. 10/17/2024  10:13:00 PM TECHNIQUE: Multiplanar multisequence MRI of the right foot was performed from the level of the Lisfranc joint through the toes without the administration of intravenous contrast. COMPARISON: Radiographs 10/16/2024. CLINICAL HISTORY: Osteomyelitis, Right foot. LIMITATIONS/ARTIFACTS: Motion artifact is present, reducing diagnostic sensitivity and specificity. FINDINGS: LISFRANC JOINT: Visualized Lisfranc ligament is intact. BONE MARROW: Abnormal signal compatible with osteomyelitis involving the 5th metatarsal, 4th metatarsal head, and proximal phalanges of the 4th and 5th toes. Associated abnormal gas density is present in the proximal phalanges and in the middle and distal phalanges of the small toe, compatible with a gas-forming organism and osteomyelitis. GREATER AND LESSER MTP JOINTS: No significant joint effusion or osseous erosions. No significant degenerative changes. Normal alignment. SOFT TISSUES: Extensive gas tracts are present in the soft tissues of the dorsum of the foot, especially laterally, compatible with cellulitis with a gas-producing organism. Abnormal gas densities also track along the plantar fascia, flexor digitorum longus, and flexor digitorum longus musculature of the foot, compatible with infectious myositis and tenosynovitis. Cutaneous irregularity is noted along the dorsum of the foot, suggesting ulceration. Circumferential subcutaneous edema is present in the forefoot and tracking into the toes. Generalized muscle edema may be due to neurogenic causes or infectious myositis. TENDONS: Abnormal gas densities track along the plantar fascia, flexor digitorum longus, and flexor digitorum longus musculature of the foot, compatible with infectious myositis and tenosynovitis. IMPRESSION: 1. Osteomyelitis involving the 5th metatarsal, 4th metatarsal head, and proximal phalanges of the 4th and 5th toes with associated gas, in the middle and distal phalanges of the 5th toe compatible  with a gas-forming organism. 2. Extensive gas in the dorsal soft tissues, especially laterally, compatible with cellulitis due to a gas-producing organism. Possible associated ulceration/slash blistering. 3. Gas tracking along the plantar fascia and flexor digitorum longus tendon and musculature, compatible with infectious myositis and tenosynovitis. 4. Circumferential subcutaneous edema of the forefoot with extension into the toes. 5. Generalized muscle edema, which may reflect neurogenic change or infectious myositis. 6. Motion artifact reduces diagnostic sensitivity and specificity. Electronically signed by: Ryan Salvage MD 10/18/2024 10:43 AM EST RP Workstation: [id number]   ECHOCARDIOGRAM COMPLETE Result Date: 10/17/2024    ECHOCARDIOGRAM REPORT   Patient Name:   EVAMAE ROWEN Date of Exam: 10/17/2024 Medical Rec #:  [id number]       Height:       60.0 in Accession #:    [id number]      Weight:       120.8 lb Date of Birth:  1976-11-11       BSA:          1.507 m Patient Age:    48 years        BP:           91/42 mmHg Patient Gender: F               HR:  76 bpm. Exam Location:  Inpatient Procedure: 2D Echo, Cardiac Doppler and Color Doppler (Both Spectral and Color            Flow Doppler were utilized during procedure). Indications:    Bacteremia R78.81  History:        Patient has no prior history of Echocardiogram examinations. Hx                 of CVA, Signs/Symptoms:Bacteremia; Risk Factors:Diabetes,                 Hypertension, Dyslipidemia and Sleep Apnea.  Sonographer:    Koleen Popper RDCS Referring Phys: [id number] ALEJANDRO LATIF [facility] IMPRESSIONS  1. Left ventricular ejection fraction, by estimation, is 60 to 65%. The left ventricle has normal function. The left ventricle has no regional wall motion abnormalities. Left ventricular diastolic parameters were normal.  2. Right ventricular systolic function is normal. The right ventricular size is normal. There is normal pulmonary artery  systolic pressure.  3. The mitral valve is normal in structure. No evidence of mitral valve regurgitation. No evidence of mitral stenosis.  4. The aortic valve has an indeterminant number of cusps. Aortic valve regurgitation is not visualized. No aortic stenosis is present.  5. The inferior vena cava is dilated in size with >50% respiratory variability, suggesting right atrial pressure of 8 mmHg. Comparison(s): No prior Echocardiogram. Conclusion(s)/Recommendation(s): No evidence of valvular vegetations on this transthoracic echocardiogram. Consider a transesophageal echocardiogram to exclude infective endocarditis if clinically indicated. FINDINGS  Left Ventricle: Left ventricular ejection fraction, by estimation, is 60 to 65%. The left ventricle has normal function. The left ventricle has no regional wall motion abnormalities. Strain was performed and the global longitudinal strain is indeterminate. The left ventricular internal cavity size was normal in size. There is no left ventricular hypertrophy. Left ventricular diastolic parameters were normal. Right Ventricle: The right ventricular size is normal. No increase in right ventricular wall thickness. Right ventricular systolic function is normal. There is normal pulmonary artery systolic pressure. The tricuspid regurgitant velocity is 2.46 m/s, and  with an assumed right atrial pressure of 8 mmHg, the estimated right ventricular systolic pressure is 32.2 mmHg. Left Atrium: Left atrial size was normal in size. Right Atrium: Right atrial size was normal in size. Pericardium: There is no evidence of pericardial effusion. Mitral Valve: The mitral valve is normal in structure. No evidence of mitral valve regurgitation. No evidence of mitral valve stenosis. Tricuspid Valve: The tricuspid valve is normal in structure. Tricuspid valve regurgitation is trivial. No evidence of tricuspid stenosis. Aortic Valve: The aortic valve has an indeterminant number of cusps. Aortic  valve regurgitation is not visualized. No aortic stenosis is present. Pulmonic Valve: The pulmonic valve was grossly normal. Pulmonic valve regurgitation is not visualized. No evidence of pulmonic stenosis. Aorta: The aortic root and ascending aorta are structurally normal, with no evidence of dilitation. Venous: The inferior vena cava is dilated in size with greater than 50% respiratory variability, suggesting right atrial pressure of 8 mmHg. IAS/Shunts: No atrial level shunt detected by color flow Doppler. Additional Comments: 3D was performed not requiring image post processing on an independent workstation and was indeterminate.  LEFT VENTRICLE PLAX 2D LVIDd:         4.50 cm   Diastology LVIDs:         2.80 cm   LV e' medial:    11.10 cm/s LV PW:         0.70 cm  LV E/e' medial:  11.2 LV IVS:        0.90 cm   LV e' lateral:   12.70 cm/s LVOT diam:     1.80 cm   LV E/e' lateral: 9.8 LV SV:         53 LV SV Index:   35 LVOT Area:     2.54 cm  RIGHT VENTRICLE             IVC RV Basal diam:  3.40 cm     IVC diam: 2.50 cm RV S prime:     12.80 cm/s TAPSE (M-mode): 3.2 cm LEFT ATRIUM             Index        RIGHT ATRIUM          Index LA diam:        3.10 cm 2.06 cm/m   RA Area:     8.54 cm LA Vol (A2C):   23.1 ml 15.33 ml/m  RA Volume:   14.00 ml 9.29 ml/m LA Vol (A4C):   27.3 ml 18.12 ml/m LA Biplane Vol: 25.7 ml 17.06 ml/m  AORTIC VALVE LVOT Vmax:   121.00 cm/s LVOT Vmean:  79.000 cm/s LVOT VTI:    0.210 m  AORTA Ao Root diam: 2.30 cm Ao Asc diam:  2.50 cm MITRAL VALVE                TRICUSPID VALVE MV Area (PHT): 4.63 cm     TR Peak grad:   24.2 mmHg MV Decel Time: 164 msec     TR Vmax:        246.00 cm/s MV E velocity: 124.00 cm/s MV A velocity: 67.90 cm/s   SHUNTS MV E/A ratio:  1.83         Systemic VTI:  0.21 m                             Systemic Diam: 1.80 cm Vishnu Priya Mallipeddi Electronically signed by Diannah Late Mallipeddi Signature Date/Time: 10/17/2024/1:19:24 PM    Final    CT CERVICAL  SPINE W CONTRAST Result Date: 10/17/2024 EXAM: CT CERVICAL SPINE WITH CONTRAST 10/17/2024 11:54:53 AM TECHNIQUE: CT of the cervical spine was performed with the administration of 100 mL of iohexol  (OMNIPAQUE ) 300 MG/ML solution. Multiplanar reformatted images are provided for review. Automated exposure control, iterative reconstruction, and/or weight based adjustment of the mA/kV was utilized to reduce the radiation dose to as low as reasonably achievable. COMPARISON: None available. CLINICAL HISTORY: Neck pain, infection suspected, no prior imaging. FINDINGS: CERVICAL SPINE: BONES AND ALIGNMENT: No acute fracture or traumatic malalignment. DEGENERATIVE CHANGES: No significant degenerative changes. SOFT TISSUES: Limited soft tissue imaging on the CT of the cervical spine. There is mild subcutaneous fat stranding within the posterior right neck. No abscess or fluid collection. Anterior neck is excluded from view. IMPRESSION: 1. Mild subcutaneous fat stranding within the posterior right neck without abscess or fluid collection. 2. Anterior neck not evaluated on this study due to exclusion from the field of view. 3. No acute abnormality of the cervical spine. Electronically signed by: Franky Stanford MD 10/17/2024 12:06 PM EDT RP Workstation: [id number]   CT MAXILLOFACIAL W CONTRAST Result Date: 10/17/2024 EXAM: CT Face with contrast 10/17/2024 11:54:53 AM TECHNIQUE: CT of the face was performed with the administration of 100 mL of iohexol  (OMNIPAQUE ) 300 MG/ML solution. Multiplanar reformatted images are provided for  review. Automated exposure control, iterative reconstruction, and/or weight based adjustment of the mA/kV was utilized to reduce the radiation dose to as low as reasonably achievable. COMPARISON: None available CLINICAL HISTORY: Facial Cellulitis FINDINGS: AERODIGESTIVE TRACT: No mass. No edema. SALIVARY GLANDS: No acute abnormality. LYMPH NODES: No suspicious cervical lymphadenopathy. SOFT TISSUES:  Subcutaneous fat stranding is present throughout much of the facial soft tissues, worse in the right lower face. No abscess or drainable fluid collection. No clear infectious source. BRAIN, ORBITS AND SINUSES: No acute abnormality. BONES: No acute abnormality. No suspicious bone lesion. IMPRESSION: 1. Subcutaneous fat stranding throughout much of the facial soft tissues, worst in the right lower face, consistent with facial cellulitis. 2. No abscess or drainable fluid collection. 3. No clear infectious source identified. Electronically signed by: Franky Stanford MD 10/17/2024 12:03 PM EDT RP Workstation: [id number]   DG Foot Complete Right Result Date: 10/16/2024 CLINICAL DATA:  Right foot ulcer EXAM: RIGHT FOOT COMPLETE - 3+ VIEW COMPARISON:  None Available. FINDINGS: Frontal, oblique, lateral views of the right foot are obtained. Large area of soft tissue ulceration within the lateral forefoot, with extensive subcutaneous gas throughout the forefoot and midfoot consistent with ulceration and cellulitis with gas-forming organism. There are areas of focal bony destruction involving the fifth digit phalanges, head of the fifth metatarsal, and head of the fourth metatarsal, consistent with osteomyelitis and septic arthritis. There are no acute displaced fractures. IMPRESSION: 1. Soft tissue ulceration involving the lateral forefoot, with extensive soft tissue gas throughout the lateral forefoot and midfoot. Findings are consistent with cellulitis and infection with gas-forming organism. 2. Areas of bony destruction involving the fifth digit phalanges, head of the fifth metatarsal, and head of the fourth metatarsal, compatible with osteomyelitis and possible septic arthritis. 3. No acute fracture. Electronically Signed   By: Ozell Daring M.D.   On: 10/16/2024 16:23   DG Chest Port 1 View Result Date: 10/16/2024 CLINICAL DATA:  Sepsis EXAM: PORTABLE CHEST 1 VIEW COMPARISON:  07/01/2024 FINDINGS: The heart size  and mediastinal contours are within normal limits. Both lungs are clear. The visualized skeletal structures are unremarkable. IMPRESSION: No active disease. Electronically Signed   By: Ozell Daring M.D.   On: 10/16/2024 16:21      Subjective: Ongoing intermittent loose stools for which she gets Imodium.  Had several concerns this morning during evaluation along with her female RN at bedside including loss of her scalp hair which was discussed in detail with her.  Discharge Exam:  Vitals:   10/30/24 2017 10/31/24 0501 10/31/24 0833 10/31/24 1525  BP: 117/80 100/69 98/67 132/73  Pulse: 88 78 81 80  Resp:  16  16  Temp: 98.2 F (36.8 C) 97.7 F (36.5 C) 98 F (36.7 C) 97.6 F (36.4 C)  TempSrc: Oral Axillary Oral Oral  SpO2: 100% 99% 99% 98%  Weight:      Height:        General exam: Young female, small built, frail, chronically ill looking sitting up comfortably in bed without distress. Respiratory system: Clear to auscultation.  No increased work of breathing. Cardiovascular system: S1 & S2 heard, RRR. No JVD, murmurs, rubs, gallops or clicks. No pedal edema. Stable Gastrointestinal system: Abdomen is nondistended, soft and nontender. No organomegaly or masses felt. Normal bowel sounds heard. Central nervous system: Alert and oriented. No focal neurological deficits. Extremities: Symmetric 5 x 5 power.  Removed left foot dressing.  Left partial fifth ray amputation site wound has healed without acute findings.  During the time of my evaluation, patient still had right lower extremity wound VAC and stump shrinker.  After that, as per RN report, as per Dr. Crist instructions, wound VAC was removed.  Dry dressing applied. Skin: Since I assumed care of this patient on 10/28/2024, she has had significant scalp alopecia.  Over the first 1 or 2 days, I did see white specks on her remaining hair? nits.  In addition, she also appears to have some dry flaking skin on her face and more so on  her forehead and frontal scalp.  She has a clean open superficial wound on her left ear without acute findings. Psychiatry: Judgement and insight appear normal. Mood & affect appropriate.     The results of significant diagnostics from this hospitalization (including imaging, microbiology, ancillary and laboratory) are listed below for reference.     Microbiology: No results found for this or any previous visit (from the past 240 hours).   Labs: CBC: Recent Labs  Lab 10/27/24 0337 10/28/24 0910 10/29/24 0500  WBC 12.3* 11.3* 10.2  NEUTROABS 6.1 5.6 4.6  HGB 9.6* 10.2* 9.7*  HCT 30.0* 32.3* 30.8*  MCV 78.7* 79.2* 80.2  PLT 564* 555* 564*    Basic Metabolic Panel: Recent Labs  Lab 10/27/24 0337 10/28/24 0508  NA 130* 132*  K 4.4 5.0  CL 92* 94*  CO2 27 26  GLUCOSE 275* 221*  BUN 9 13  CREATININE 0.41* 0.57  CALCIUM 8.4* 8.6*    Liver Function Tests: No results for input(s): AST, ALT, ALKPHOS, BILITOT, PROT, ALBUMIN in the last 168 hours.  CBG: Recent Labs  Lab 10/30/24 1117 10/30/24 1449 10/30/24 2057 10/31/24 0836 10/31/24 1252  GLUCAP 112* 285* 152* 177* 244*     Urinalysis    Component Value Date/Time   COLORURINE YELLOW 10/17/2024 1108   APPEARANCEUR HAZY (A) 10/17/2024 1108   LABSPEC 1.027 10/17/2024 1108   PHURINE 5.0 10/17/2024 1108   GLUCOSEU 150 (A) 10/17/2024 1108   HGBUR NEGATIVE 10/17/2024 1108   BILIRUBINUR NEGATIVE 10/17/2024 1108   KETONESUR NEGATIVE 10/17/2024 1108   PROTEINUR 30 (A) 10/17/2024 1108   NITRITE NEGATIVE 10/17/2024 1108   LEUKOCYTESUR NEGATIVE 10/17/2024 1108      Time coordinating discharge: 50 minutes  SIGNED:  Trenda Mar, MD,  FACP, [facility], [facility], [facility]   [facility] [place]     To contact the attending provider between 7A-7P or the covering provider during after hours 7P-7A, please log into the web site [email or web address] and access using universal [facility] password for that web site. If you do not have the password, please call the hospital operator.

## 2024-10-31 NOTE — Progress Notes (Addendum)
 PROGRESS NOTE   Phyllis Pittman  FMW:[id number]    DOB: [birth year]    DOA: 10/16/2024  PCP: [name], PA   I have briefly reviewed patients previous medical records in [facility].   Brief Hospital Course:  48 year old female with medical history significant for type II DM with neuropathy, HTN, HLD, CVA, GERD, anxiety and depression, recently treated for left foot infection, partial fifth ray amputation, prolonged IV antibiotics via PICC line, seen at wound care center the week prior to this admission, found to have a new wound with fever at that time, right foot was swollen and erythematous up to the ankle with congested discolored small toe with multiple open wounds.  10/22, she was advised to go to the ED for further evaluation and I&D but patient did not go.  She return to wound care center on 10/29, her right foot was necrotic and she was advised to return to ED.  She again declined but returned 2 days later via EMS with foul-smelling drainage, pain and lethargic.  She was admitted due to sepsis secondary to right foot gangrene.  Vascular surgery and orthopedics consulted.  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  Currently medically optimized for DC to SNF pending completion of therapy for her Lice.   Assessment & Plan:   Sepsis, POA Secondary to diabetic right foot infection with gangrene History and details as noted above. Vascular surgery, orthopedics and ID consulted. 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 Harden on 11/5. Completed a course of IV Unasyn and linezolid on 11/6.  Blood cultures negative. Multimodality pain control (currently on scheduled Tylenol  1 g 4 times daily but only getting maybe 2-3 times daily, gabapentin 800 mg 3 times daily p.o. Dilaudid  4 mg every 4 hours as needed and Zanaflex 4  mg every 6 hours as needed for muscle spasm).  Judicious use of opioids.  No indication for parenteral opioids.  Tolerating p.o. Therapies recommended SNF with plans to DC to SNF pending completion of treatment for lice 11/14: Patient still has wound VAC on RLE and she states that the plastic protector sleeve on top is loose.  Discussed with Dr. Harden, plans to DC wound VAC and recommends dry dressing changes to right BKA and same to the left foot as well. 11/15: Has ordered to remove wound VAC, RN plans on doing this this morning.  Awaiting Dr. Harden to see patient.  Patient has several concerns, one of them is why is it that she does not need ongoing wound VAC to the right lower extremity?.  I advised her that as per Dr. Harden, this is no longer needed/indicated.  He can further explain to her when he sees her in person.  Lice infestation S/p topical permethrin x 2 doses on 11/11 and 11/12 followed by pyrethrin shampoo on 11/14. Patient quite concerned about overall treatment.  She says she never had lice before and this is her first time.  She expressed extreme concern that she has lost a lot of her scalp hair which she feels is due to the chemicals in the treatment.  I tried to explain to her that some of this could be due to the lice infestation but could also be due to other causes.  She became extremely argumentative.  Offered infectious disease consultation for assistance but she has  thus far not agreed to this.  She says that she needs a break from scalp treatment.  Unfortunately we do not have a dermatologist in house for assessment of her alopecia and this will have to be done as outpatient.  Her scalp lice/nits have progressively declined (up to 11/14, still had lice and some eggs).  Awaiting on RN report today after her evaluation.  She has flaky dry skin of her face and scalp, she may have some underlying dermatitis and would benefit from dermatology consultation.  Facial dermatitis,?   Cellulitis Topical ketoconazole Outpatient dermatology consultation and follow-up-see discussion above. Also on Neosporin ointment for open wound on the left ear. Appears to have improved or even resolved.  POTS syndrome Intermittent soft blood pressures Continue midodrine 10 mg 3 times daily. Was supposed to have tilt table testing at [facility] be followed up outpatient  EPEC diarrhea Completed course of azithromycin.  Supportive care  IBS history Complicated by EPEC diarrhea as noted above.  Had Flexi-Seal which was removed 11/10. Continue bowel regimen Patient reports that at home she used to be on Imodium multiple times daily as needed up to a maximum of 8 mg/day.  Her PCP then cut it down to 1 tablet at bedtime.  Per patient report, her IBS is complicated by alternating constipation and diarrhea.  Continue as needed Imodium.  Urinary retention Attempt to clamp Foley catheter 11/10 was unsuccessful.  It appears that patient failed a clamping trial again on 11/13.  Patient to discharge to SNF with Foley catheter and outpatient follow-up with urology in 10 to 14 days for further evaluation and voiding trial.  Patient needs to mobilize.  Uncontrolled type II DM A1c >15.5 on 11/3 CBGs better controlled after adjustment of insulin : Continue Amaryl 4 mg daily, Semglee  22 units at bedtime, mealtime NovoLog  3 units 3 times daily and NovoLog  SSI.  Monitor CBGs and adjust insulins as needed.  Anxiety and depression Continue Atarax  50 mg twice daily.  On as needed bedtime Xanax .  Anemia Stable.  Continue iron supplements.  Pseudohyponatremia  Wound 10/16/24 1830 Pressure Injury Buttocks Upper Stage 2 -  Partial thickness loss of dermis presenting as a shallow open injury with a red, pink wound bed without slough. (Active)     Wound 10/16/24 1830 Pressure Injury Sacrum Medial;Lower Stage 2 -  Partial thickness loss of dermis presenting as a shallow open injury with a  red, pink wound bed without slough. (Active)  Frequent change of positions, improve nutrition and mobilize.   Body mass index is 26.74 kg/m.   DVT prophylaxis: SCD's Start: 10/21/24 1336 enoxaparin  (LOVENOX ) injection 40 mg Start: 10/16/24 2000 SCDs Start: 10/16/24 1750 SCDs Start: 10/16/24 1749     Code Status: Full Code:  Family Communication: None at bedside Disposition:  Status is: Inpatient Remains inpatient appropriate because: Ongoing lice treatment.  Discussed with patient's RN and TOC this morning.  TOC is requesting a new SNF insurance authorization.  Can DC to SNF pending completion of lice eradication and SNF authorization.   Consultants:   Vascular surgery Orthopedics Infectious disease  Procedures:   As above  Subjective:  Patient was interviewed and evaluated along with her female RN in the room.  Patient had several complaints/concerns as noted above including scalp hair loss, reason why she does not need right-sided lower extremity wound VAC yet, postop device on the postop right BKA not fitting well, overall appeared frustrated and became quite argumentative.  Objective:   Vitals:  10/30/24 1441 10/30/24 2017 10/31/24 0501 10/31/24 0833  BP: 124/78 117/80 100/69 98/67  Pulse: 84 88 78 81  Resp: 16  16   Temp: 97.7 F (36.5 C) 98.2 F (36.8 C) 97.7 F (36.5 C) 98 F (36.7 C)  TempSrc:  Oral Axillary Oral  SpO2: 100% 100% 99% 99%  Weight:      Height:        General exam: Young female, small built, frail, chronically ill looking sitting up comfortably in bed without distress. Respiratory system: Clear to auscultation. Respiratory effort normal. Cardiovascular system: S1 & S2 heard, RRR. No JVD, murmurs, rubs, gallops or clicks. No pedal edema.  Stable. Gastrointestinal system: Abdomen is nondistended, soft and nontender. No organomegaly or masses felt. Normal bowel sounds heard. Central nervous system: Alert and oriented. No focal neurological  deficits. Extremities: Symmetric 5 x 5 power.  Left foot dressing clean and dry.  Right BKA stump with stump shrinker.  Has wound VAC in place. Skin: Since I assumed care of this patient on 10/28/2024, she has had significant scalp alopecia.  Over the first 1 or 2 days, I did see white specks on her remaining hair? nits.  In addition, she also appears to have some dry flaking skin on her face and more so on her forehead and frontal scalp.  She has a clean open superficial wound on her left ear without acute findings. Psychiatry: Judgement and insight appear normal. Mood & affect appropriate.     Data Reviewed:   I have personally reviewed following labs and imaging studies   CBC: Recent Labs  Lab 10/27/24 0337 10/28/24 0910 10/29/24 0500  WBC 12.3* 11.3* 10.2  NEUTROABS 6.1 5.6 4.6  HGB 9.6* 10.2* 9.7*  HCT 30.0* 32.3* 30.8*  MCV 78.7* 79.2* 80.2  PLT 564* 555* 564*    Basic Metabolic Panel: Recent Labs  Lab 10/27/24 0337 10/28/24 0508  NA 130* 132*  K 4.4 5.0  CL 92* 94*  CO2 27 26  GLUCOSE 275* 221*  BUN 9 13  CREATININE 0.41* 0.57  CALCIUM 8.4* 8.6*    Liver Function Tests: No results for input(s): AST, ALT, ALKPHOS, BILITOT, PROT, ALBUMIN in the last 168 hours.  CBG: Recent Labs  Lab 10/30/24 1449 10/30/24 2057 10/31/24 0836  GLUCAP 285* 152* 177*    Microbiology Studies:   No results found for this or any previous visit (from the past 240 hours).   Radiology Studies:  No results found.  Scheduled Meds:    acetaminophen   1,000 mg Oral Q6H   Chlorhexidine Gluconate Cloth  6 each Topical Daily   collagenase   Topical Daily   enoxaparin  (LOVENOX ) injection  40 mg Subcutaneous Q24H   feeding supplement  237 mL Oral BID BM   ferrous sulfate  325 mg Oral QODAY   gabapentin  800 mg Oral TID   glimepiride  4 mg Oral Q breakfast   hydrOXYzine   50 mg Oral BID   insulin  aspart  0-15 Units Subcutaneous TID WC   insulin  aspart  0-5 Units  Subcutaneous QHS   insulin  aspart  3 Units Subcutaneous TID WC   insulin  glargine-yfgn  22 Units Subcutaneous QHS   ketoconazole  1 Application Topical BID   midodrine  10 mg Oral TID WC   neomycin-bacitracin-polymyxin   Topical TID   nystatin  5 mL Oral QID   pantoprazole   40 mg Oral Daily    Continuous Infusions:     LOS: 15 days  Trenda Mar, MD,  FACP, [facility], [facility], [facility]   [facility] Ad[facility] [place]      To contact the attending provider between 7A-7P or the covering provider during after hours 7P-7A, please log into the web site [email or web address] and access using universal [facility] password for that web site. If you do not have the password, please call the hospital operator.  10/31/2024, 10:29 AM

## 2024-10-31 NOTE — Progress Notes (Signed)
 1930-attempted to call report to [facility] x[facility]. No answer.

## 2024-10-31 NOTE — TOC Progression Note (Addendum)
 Transition of Care [facility]) - Progression Note    Patient Details  Name: Bellami Farrelly MRN: [id number] Date of Birth: 1976/01/11  [facility]) CM/SW Contact  Isaiah Public, LCSWA Phone Number: 10/31/2024, 12:43 PM  Clinical Narrative:     Patients insurance authorization has been approved. Plan Auth ID# [id number] Auth ID# [id number]. Insurance authorization has been approved from 11/14-11/18. CSW spoke with Tammy with [facility] who confirmed facility can accept patient today. CSW informed MD.   Barriers to Discharge: Continued Medical Work up               Expected Discharge Plan and Services                                               Social Drivers of Health (SDOH) Interventions SDOH Screenings   Food Insecurity: Patient Declined (10/18/2024)  Housing: Unknown (10/18/2024)  Transportation Needs: Unknown (10/18/2024)  Utilities: Patient Declined (10/18/2024)  Social Connections: Unknown (04/20/2022)   Received from [facility]  Tobacco Use: High Risk (10/21/2024)    Readmission Risk Interventions     No data to display

## 2024-10-31 NOTE — TOC Transition Note (Addendum)
 Transition of Care [facility]) - Discharge Note   Patient Details  Name: Phyllis Pittman MRN: [id number] Date of Birth: [birth year]  Transition of Care [facility]) CM/SW Contact:  Isaiah Public, LCSWA Phone Number: 10/31/2024, 3:57 PM   Clinical Narrative:     Patient will DC to: [facility]   Anticipated DC date: 10/31/2024  Family notified: Patient declined.  Transport by: [facility]  ?  Per MD patient ready for DC to [facility]. RN, patient, and facility notified of DC. Discharge Summary sent to facility. RN given number for report [phone] RM# [id number]. DC packet on chart. Ambulance transport requested for patient.  CSW signing off.   Final next level of care: Skilled Nursing Facility Barriers to Discharge: No Barriers Identified   Patient Goals and CMS Choice Patient states their goals for this hospitalization and ongoing recovery are:: SNF CMS Medicare.gov Compare Post Acute Care list provided to:: Patient Choice offered to / list presented to : Patient      Discharge Placement              Patient chooses bed at:  [facility]) Patient to be transferred to facility by: [facility]   Patient and family notified of of transfer: 10/31/24  Discharge Plan and Services Additional resources added to the After Visit Summary for                                       Social Drivers of Health (SDOH) Interventions SDOH Screenings   Food Insecurity: Patient Declined (10/18/2024)  Housing: Unknown (10/18/2024)  Transportation Needs: Unknown (10/18/2024)  Utilities: Patient Declined (10/18/2024)  Social Connections: Unknown (04/20/2022)   Received from [facility]  Tobacco Use: High Risk (10/21/2024)     Readmission Risk Interventions     No data to display

## 2024-10-31 NOTE — Plan of Care (Signed)
  Problem: Coping: Goal: Ability to adjust to condition or change in health will improve Outcome: Adequate for Discharge   

## 2024-10-31 NOTE — Progress Notes (Signed)
 Patient ID: Phyllis Pittman, female   DOB: July 09, 1976, 48 y.o.   MRN: [id number] Patient is status post transtibial amputation.  We will have her discontinue the limb protector this has been too loose and not fitting well.  The wound VAC sponge needs to be removed prior to discharge and a dry dressing applied.  I will follow-up after discharge.

## 2024-11-05 ENCOUNTER — Ambulatory Visit: Admitting: Physician Assistant

## 2024-11-05 DIAGNOSIS — Z899 Acquired absence of limb, unspecified: Secondary | ICD-10-CM

## 2024-11-05 NOTE — Progress Notes (Signed)
 Office Visit Note   Patient: Phyllis Pittman           Date of Birth: Mar 14, 1976           MRN: [id number] Visit Date: 11/05/2024              Requested by: [name], [place] [address] [place],  [place] [address] PCP: [name], [place]  Chief Complaint  Patient presents with   Right Leg - Routine Post Op    10/21/2024 Right BKA      HPI: Phyllis Pittman is a  48 y.o. female with a diagnosis of Gangrene Right Foot who failed conservative measures and elected for surgical management.  She is s/p right BKA on 10/21/24.  She is here for her first post op visit.    Assessment & Plan: Visit Diagnoses: No diagnosis found.  Plan: Vashe was used to to clean the skin.  Dry guaze and ace wrap compression with black stump sock.  Elevation of the stump to decrease swelling and help with pain.     Follow-Up Instructions: Return in about 13 days (around 11/18/2024).   Ortho Exam  Patient is alert, oriented, no adenopathy, well-dressed, normal affect, normal respiratory effort. Right BKA incision is healing well and the skin is well approximated.  There are superficial red areas of skin on the posterior flap.  No frank open wounds.  The stump is warm and appears viable.         Imaging: No results found. No images are attached to the encounter.  Labs: Lab Results  Component Value Date   HGBA1C >15.5 (H) 10/19/2024   HGBA1C >15.5 (H) 10/17/2024   HGBA1C >15.5 (H) 10/16/2024   ESRSEDRATE 136 (H) 10/16/2024   CRP 34.6 (H) 10/16/2024   REPTSTATUS 10/25/2024 FINAL 10/20/2024   GRAMSTAIN NO WBC SEEN NO ORGANISMS SEEN CYTOSPIN SMEAR  08/12/2021   GRAMSTAIN  08/12/2021    NO SQUAMOUS EPITHELIAL CELLS SEEN NO WBC SEEN NO ORGANISMS SEEN Performed at [facility], [address] [place], [place] [address]    CULT  10/20/2024    NO GROWTH 5 DAYS Performed at [facility], [address], [place], [place] [address]      Lab Results  Component Value  Date   ALBUMIN <1.5 (L) 10/21/2024   ALBUMIN <1.5 (L) 10/19/2024   ALBUMIN 2.0 (L) 10/17/2024    Lab Results  Component Value Date   MG 2.1 10/23/2024   MG 1.5 (L) 10/22/2024   MG 1.4 (L) 10/19/2024   No results found for: VD25OH  No results found for: PREALBUMIN    Latest Ref Rng & Units 10/29/2024    5:00 AM 10/28/2024    9:10 AM 10/27/2024    3:37 AM  CBC EXTENDED  WBC 4.0 - 10.5 K/uL 10.2  11.3  12.3   RBC 3.87 - 5.11 MIL/uL 3.84  4.08  3.81   Hemoglobin 12.0 - 15.0 g/dL 9.7  89.7  9.6   HCT 63.9 - 46.0 % 30.8  32.3  30.0   Platelets 150 - 400 K/uL 564  555  564   NEUT# 1.7 - 7.7 K/uL 4.6  5.6  6.1   Lymph# 0.7 - 4.0 K/uL 4.2  4.4  4.6      There is no height or weight on file to calculate BMI.  Orders:  No orders of the defined types were placed in this encounter.  No orders of the  defined types were placed in this encounter.    Procedures: No procedures performed  Clinical Data: No additional findings.  ROS:  All other systems negative, except as noted in the HPI. Review of Systems  Objective: Vital Signs: LMP 07/30/2024   Specialty Comments:  No specialty comments available.  PMFS History: Patient Active Problem List   Diagnosis Date Noted   Cutaneous abscess of right foot 10/19/2024   Gangrene of right foot (HCC) 10/19/2024   Facial dermatitis 10/19/2024   Diarrhea of infectious origin 10/19/2024   Acute osteomyelitis of phalanx of right foot (HCC) 10/18/2024   Osteomyelitis (HCC) 10/16/2024   Pyelonephritis 08/12/2021   Sepsis (HCC) 08/12/2021   Lactic acidosis 08/12/2021   Hyponatremia 08/12/2021   Hypokalemia 08/12/2021   Hyperglycemia 08/12/2021   T2DM (type 2 diabetes mellitus) (HCC) 08/12/2021   HTN (hypertension) 08/12/2021   HLD (hyperlipidemia) 08/12/2021   Anxiety and depression 08/12/2021   Past Medical History:  Diagnosis Date   Anxiety    Cancer (HCC)    neoplasm of cervix   CVA (cerebral vascular accident)  (HCC)    hx of   Depression with anxiety    Diabetes mellitus without complication (HCC)    type 2   Female hirsutism    Fibromyalgia    GERD without esophagitis    Hyperlipidemia    Hypertension    IBS (irritable bowel syndrome)    Long term (current) use of insulin  (HCC)    Migraine    Myopia of both eyes    Neuropathy    feet   Neuropathy    OSA (obstructive sleep apnea)    Sciatica    Shingles     Family History  Problem Relation Age of Onset   COPD Mother    Diabetes Mother    Cancer Mother    Diabetes Father    Cancer Father     Past Surgical History:  Procedure Laterality Date   AMPUTATION Right 10/21/2024   Procedure: AMPUTATION BELOW KNEE;  Surgeon: Harden Jerona GAILS, MD;  Location: [facility] OR;  Service: Orthopedics;  Laterality: Right;  RIGHT BELOW KNEE AMPUTATION   APPLICATION OF WOUND VAC Right 10/21/2024   Procedure: APPLICATION, WOUND VAC;  Surgeon: Harden Jerona GAILS, MD;  Location: [facility] OR;  Service: Orthopedics;  Laterality: Right;   CESAREAN SECTION     05/2001, 07/2012   CHOLECYSTECTOMY  10/1999   FACIAL COSMETIC SURGERY     FOOT SURGERY     Social History   Occupational History    Comment: home maker  Tobacco Use   Smoking status: Every Day    Current packs/day: 0.25    Types: Cigarettes   Smokeless tobacco: Never   Tobacco comments:    5 or less a day  Vaping Use   Vaping status: Never Used  Substance and Sexual Activity   Alcohol use: Yes    Comment: occ   Drug use: No   Sexual activity: Not on file

## 2024-11-16 ENCOUNTER — Encounter (HOSPITAL_COMMUNITY): Payer: Self-pay

## 2024-11-18 ENCOUNTER — Encounter: Admitting: Physician Assistant

## 2024-11-19 ENCOUNTER — Ambulatory Visit: Admitting: Physician Assistant

## 2024-11-19 DIAGNOSIS — Z899 Acquired absence of limb, unspecified: Secondary | ICD-10-CM

## 2024-11-19 NOTE — Progress Notes (Unsigned)
 Office Visit Note   Patient: Phyllis Pittman           Date of Birth: 09/24/1976           MRN: [id number] Visit Date: 11/19/2024              Requested by: [name], PA [address] [place],  [place] [address] PCP: [name], [place]  Chief Complaint  Patient presents with  . Right Leg - Routine Post Op    Hx right BKA      HPI: ***  Assessment & Plan: Visit Diagnoses: No diagnosis found.  Plan: ***  Follow-Up Instructions: No follow-ups on file.   Ortho Exam  Patient is alert, oriented, no adenopathy, well-dressed, normal affect, normal respiratory effort. ***    Imaging: No results found. No images are attached to the encounter.  Labs: Lab Results  Component Value Date   HGBA1C >15.5 (H) 10/19/2024   HGBA1C >15.5 (H) 10/17/2024   HGBA1C >15.5 (H) 10/16/2024   ESRSEDRATE 136 (H) 10/16/2024   CRP 34.6 (H) 10/16/2024   REPTSTATUS 10/25/2024 FINAL 10/20/2024   GRAMSTAIN NO WBC SEEN NO ORGANISMS SEEN CYTOSPIN SMEAR  08/12/2021   GRAMSTAIN  08/12/2021    NO SQUAMOUS EPITHELIAL CELLS SEEN NO WBC SEEN NO ORGANISMS SEEN Performed at [facility], [address] [place], [place] [address]    CULT  10/20/2024    NO GROWTH 5 DAYS Performed at [facility], [address] [place], [place] [address]      Lab Results  Component Value Date   ALBUMIN <1.5 (L) 10/21/2024   ALBUMIN <1.5 (L) 10/19/2024   ALBUMIN 2.0 (L) 10/17/2024    Lab Results  Component Value Date   MG 2.1 10/23/2024   MG 1.5 (L) 10/22/2024   MG 1.4 (L) 10/19/2024   No results found for: VD25OH  No results found for: PREALBUMIN    Latest Ref Rng & Units 10/29/2024    5:00 AM 10/28/2024    9:10 AM 10/27/2024    3:37 AM  CBC EXTENDED  WBC 4.0 - 10.5 K/uL 10.2  11.3  12.3   RBC 3.87 - 5.11 MIL/uL 3.84  4.08  3.81   Hemoglobin 12.0 - 15.0 g/dL 9.7  89.7  9.6   HCT 63.9 - 46.0 % 30.8  32.3  30.0   Platelets 150 - 400 K/uL 564  555  564    NEUT# 1.7 - 7.7 K/uL 4.6  5.6  6.1   Lymph# 0.7 - 4.0 K/uL 4.2  4.4  4.6      There is no height or weight on file to calculate BMI.  Orders:  No orders of the defined types were placed in this encounter.  No orders of the defined types were placed in this encounter.    Procedures: No procedures performed  Clinical Data: No additional findings.  ROS:  All other systems negative, except as noted in the HPI. Review of Systems  Objective: Vital Signs: LMP 07/30/2024   Specialty Comments:  No specialty comments available.  PMFS History: Patient Active Problem List   Diagnosis Date Noted  . Cutaneous abscess of right foot 10/19/2024  . Gangrene of right foot (HCC) 10/19/2024  . Facial dermatitis 10/19/2024  . Diarrhea of infectious origin 10/19/2024  . Acute osteomyelitis of phalanx of right foot (HCC) 10/18/2024  . Osteomyelitis (HCC) 10/16/2024  . Pyelonephritis 08/12/2021  . Sepsis (HCC) 08/12/2021  . Lactic acidosis  08/12/2021  . Hyponatremia 08/12/2021  . Hypokalemia 08/12/2021  . Hyperglycemia 08/12/2021  . T2DM (type 2 diabetes mellitus) (HCC) 08/12/2021  . HTN (hypertension) 08/12/2021  . HLD (hyperlipidemia) 08/12/2021  . Anxiety and depression 08/12/2021   Past Medical History:  Diagnosis Date  . Anxiety   . Cancer (HCC)    neoplasm of cervix  . CVA (cerebral vascular accident) (HCC)    hx of  . Depression with anxiety   . Diabetes mellitus without complication (HCC)    type 2  . Female hirsutism   . Fibromyalgia   . GERD without esophagitis   . Hyperlipidemia   . Hypertension   . IBS (irritable bowel syndrome)   . Long term (current) use of insulin  (HCC)   . Migraine   . Myopia of both eyes   . Neuropathy    feet  . Neuropathy   . OSA (obstructive sleep apnea)   . Sciatica   . Shingles     Family History  Problem Relation Age of Onset  . COPD Mother   . Diabetes Mother   . Cancer Mother   . Diabetes Father   . Cancer Father      Past Surgical History:  Procedure Laterality Date  . AMPUTATION Right 10/21/2024   Procedure: AMPUTATION BELOW KNEE;  Surgeon: Harden Jerona GAILS, MD;  Location[facility] OR;  Service: Orthopedics;  Laterality: Right;  RIGHT BELOW KNEE AMPUTATION  . APPLICATION OF WOUND VAC Right 10/21/2024   Procedure: APPLICATION, WOUND VAC;  Surgeon: Harden Jerona GAILS, MD;  Location: [facility] OR;  Service: Orthopedics;  Laterality: Right;  . CESAREAN SECTION     05/2001, 07/2012  . CHOLECYSTECTOMY  10/1999  . FACIAL COSMETIC SURGERY    . FOOT SURGERY     Social History   Occupational History    Comment: home maker  Tobacco Use  . Smoking status: Every Day    Current packs/day: 0.25    Types: Cigarettes  . Smokeless tobacco: Never  . Tobacco comments:    5 or less a day  Vaping Use  . Vaping status: Never Used  Substance and Sexual Activity  . Alcohol use: Yes    Comment: occ  . Drug use: No  . Sexual activity: Not on file

## 2024-11-20 ENCOUNTER — Encounter: Payer: Self-pay | Admitting: Physician Assistant

## 2024-12-07 ENCOUNTER — Telehealth: Payer: Self-pay | Admitting: Orthopedic Surgery

## 2024-12-07 NOTE — Telephone Encounter (Signed)
 Pt called saying she has fell several times and would like a sooner apt than the one she has on the 31st if possible. Call back number is [phone]

## 2024-12-07 NOTE — Telephone Encounter (Signed)
 Dr. Lauris is not here the rest of the week. [facility] can [facility] she has time open in the morning on the 30th. Please call

## 2024-12-15 ENCOUNTER — Encounter: Admitting: Physician Assistant

## 2024-12-16 ENCOUNTER — Encounter: Admitting: Physician Assistant

## 2024-12-21 ENCOUNTER — Ambulatory Visit (INDEPENDENT_AMBULATORY_CARE_PROVIDER_SITE_OTHER): Admitting: Physician Assistant

## 2024-12-21 ENCOUNTER — Encounter: Payer: Self-pay | Admitting: Physician Assistant

## 2024-12-21 ENCOUNTER — Ambulatory Visit: Payer: Self-pay

## 2024-12-21 DIAGNOSIS — Z899 Acquired absence of limb, unspecified: Secondary | ICD-10-CM

## 2024-12-21 NOTE — Progress Notes (Addendum)
 "  Office Visit Note   Patient: Phyllis Pittman           Date of Birth: 17-Dec-1976           MRN: [id number] Visit Date: 12/21/2024              Requested by: [name], PA [address] [place],  [place] [address] PCP: [name], [place]  Chief Complaint  Patient presents with   Right Leg - Routine Post Op    Right BKA      HPI: Phyllis Pittman is a 49 y.o. female with a diagnosis of Gangrene Right Foot who failed conservative measures and elected for surgical management. She is s/p right BKA on 10/21/24. On her last visit she had fallen out of her WC onto the stump.  The stump was tender to palpation without redness or openings in the skin.  She states she has fallen 3-4 times since her surgery.  She has been released from the SNF and is staying with her Aunt.    Assessment & Plan: Visit Diagnoses:  1. Status post amputation     Plan: Well held right BKA She will see [facility] for prosthesis fitting and set up.  I asked her to call our office to let us  know when she has the prosthesis and we can order gait training with PT.    Follow-Up Instructions: Return if symptoms worsen or fail to improve, for she will let us  know when she gets her prosthesis .   Ortho Exam  Patient is alert, oriented, no adenopathy, well-dressed, normal affect, normal respiratory effort. Well healed right BKA with minimal edema, no cellulitis.  Tenderness to palpation generalized not specific point tenderness.    Patient is a new right transtibial  amputee.  Patient's current comorbidities are not expected to impact the ability to function with the prescribed prosthesis. Patient verbally communicates a strong desire to use a prosthesis. Patient currently requires mobility aids to ambulate without a prosthesis.  Expects not to use mobility aids with a new prosthesis. Patient is expected to resume or reach their K Level within 6 months. Patient was active before the amputation and  independent with stairs, uneven terrain, varying cadence, and a community ambulator.  Patient is a K3 level ambulator that spends a lot of time walking around on uneven terrain over obstacles, up and down stairs, and ambulates with a variable cadence.          Imaging: X ray right BKA shows no fracture and well maintained knee joint line.  No cyst or spur.   Labs: Lab Results  Component Value Date   HGBA1C >15.5 (H) 10/19/2024   HGBA1C >15.5 (H) 10/17/2024   HGBA1C >15.5 (H) 10/16/2024   ESRSEDRATE 136 (H) 10/16/2024   CRP 34.6 (H) 10/16/2024   REPTSTATUS 10/25/2024 FINAL 10/20/2024   GRAMSTAIN NO WBC SEEN NO ORGANISMS SEEN CYTOSPIN SMEAR  08/12/2021   GRAMSTAIN  08/12/2021    NO SQUAMOUS EPITHELIAL CELLS SEEN NO WBC SEEN NO ORGANISMS SEEN Performed at [facility], [address], [place], [place] [address]    CULT  10/20/2024    NO GROWTH 5 DAYS Performed at [facility], [address], [place], [place] [address]      Lab Results  Component Value Date   ALBUMIN <1.5 (L) 10/21/2024   ALBUMIN <1.5 (L) 10/19/2024   ALBUMIN 2.0 (L) 10/17/2024    Lab Results  Component Value Date  MG 2.1 10/23/2024   MG 1.5 (L) 10/22/2024   MG 1.4 (L) 10/19/2024   No results found for: VD25OH  No results found for: PREALBUMIN    Latest Ref Rng & Units 10/29/2024    5:00 AM 10/28/2024    9:10 AM 10/27/2024    3:37 AM  CBC EXTENDED  WBC 4.0 - 10.5 K/uL 10.2  11.3  12.3   RBC 3.87 - 5.11 MIL/uL 3.84  4.08  3.81   Hemoglobin 12.0 - 15.0 g/dL 9.7  89.7  9.6   HCT 63.9 - 46.0 % 30.8  32.3  30.0   Platelets 150 - 400 K/uL 564  555  564   NEUT# 1.7 - 7.7 K/uL 4.6  5.6  6.1   Lymph# 0.7 - 4.0 K/uL 4.2  4.4  4.6      There is no height or weight on file to calculate BMI.  Orders:  Orders Placed This Encounter  Procedures   XR Tibia/Fibula Right   No orders of the defined types were placed in this encounter.    Procedures: No procedures  performed  Clinical Data: No additional findings.  ROS:  All other systems negative, except as noted in the HPI. Review of Systems  Objective: Vital Signs: There were no vitals taken for this visit.  Specialty Comments:  No specialty comments available.  PMFS History: Patient Active Problem List   Diagnosis Date Noted   Cutaneous abscess of right foot 10/19/2024   Gangrene of right foot (HCC) 10/19/2024   Facial dermatitis 10/19/2024   Diarrhea of infectious origin 10/19/2024   Acute osteomyelitis of phalanx of right foot (HCC) 10/18/2024   Osteomyelitis (HCC) 10/16/2024   Pyelonephritis 08/12/2021   Sepsis (HCC) 08/12/2021   Lactic acidosis 08/12/2021   Hyponatremia 08/12/2021   Hypokalemia 08/12/2021   Hyperglycemia 08/12/2021   T2DM (type 2 diabetes mellitus) (HCC) 08/12/2021   HTN (hypertension) 08/12/2021   HLD (hyperlipidemia) 08/12/2021   Anxiety and depression 08/12/2021   Past Medical History:  Diagnosis Date   Anxiety    Cancer (HCC)    neoplasm of cervix   CVA (cerebral vascular accident) (HCC)    hx of   Depression with anxiety    Diabetes mellitus without complication (HCC)    type 2   Female hirsutism    Fibromyalgia    GERD without esophagitis    Hyperlipidemia    Hypertension    IBS (irritable bowel syndrome)    Long term (current) use of insulin  (HCC)    Migraine    Myopia of both eyes    Neuropathy    feet   Neuropathy    OSA (obstructive sleep apnea)    Sciatica    Shingles     Family History  Problem Relation Age of Onset   COPD Mother    Diabetes Mother    Cancer Mother    Diabetes Father    Cancer Father     Past Surgical History:  Procedure Laterality Date   AMPUTATION Right 10/21/2024   Procedure: AMPUTATION BELOW KNEE;  Surgeon: Harden Jerona GAILS, MD;  Location: [facility] OR;  Service: Orthopedics;  Laterality: Right;  RIGHT BELOW KNEE AMPUTATION   APPLICATION OF WOUND VAC Right 10/21/2024   Procedure: APPLICATION, WOUND VAC;   Surgeon: Harden Jerona GAILS, MD;  Location: [facility] OR;  Service: Orthopedics;  Laterality: Right;   CESAREAN SECTION     05/2001, 07/2012   CHOLECYSTECTOMY  10/1999   FACIAL COSMETIC SURGERY  FOOT SURGERY     Social History   Occupational History    Comment: home maker  Tobacco Use   Smoking status: Every Day    Current packs/day: 0.25    Types: Cigarettes   Smokeless tobacco: Never   Tobacco comments:    5 or less a day  Vaping Use   Vaping status: Never Used  Substance and Sexual Activity   Alcohol use: Yes    Comment: occ   Drug use: No   Sexual activity: Not on file       "

## 2025-01-06 ENCOUNTER — Telehealth: Payer: Self-pay

## 2025-01-06 NOTE — Telephone Encounter (Signed)
 [facility] is requesting you add the five tenets to pt's note you last seen her so they can process her Rx. Thank you!

## 2025-01-06 NOTE — Telephone Encounter (Signed)
 Can you please add the 5 tenets to your last dictation for this pt for her prosthetic. Let me know once this is complete so that I can then notify [facility].

## 2025-01-08 NOTE — Progress Notes (Signed)
 Phyllis Pittman                                          MRN: [id number]   01/08/2025   The [facility] Quality Team Specialist reviewed this patient medical record for the purposes of chart review for care gap closure. The following were reviewed: chart review for care gap closure-glycemic status assessment.    [facility] Quality Team

## 2025-01-13 NOTE — Progress Notes (Signed)
 Phyllis Pittman                                          MRN: [id number]   01/13/2025   The [facility] Quality Team Specialist reviewed this patient medical record for the purposes of chart review for care gap closure. The following were reviewed: chart review for care gap closure-glycemic status assessment.    [facility] Quality Team
